# Patient Record
Sex: Female | Born: 1950
Health system: Southern US, Community
[De-identification: ages and names within clinical notes are randomized; demographics above are authoritative.]

## PROBLEM LIST (undated history)

## (undated) DIAGNOSIS — E785 Hyperlipidemia, unspecified: Secondary | ICD-10-CM

## (undated) DIAGNOSIS — I10 Essential (primary) hypertension: Secondary | ICD-10-CM

## (undated) DIAGNOSIS — G43909 Migraine, unspecified, not intractable, without status migrainosus: Secondary | ICD-10-CM

## (undated) DIAGNOSIS — D869 Sarcoidosis, unspecified: Secondary | ICD-10-CM

## (undated) DIAGNOSIS — F419 Anxiety disorder, unspecified: Secondary | ICD-10-CM

## (undated) DIAGNOSIS — F324 Major depressive disorder, single episode, in partial remission: Secondary | ICD-10-CM

## (undated) DIAGNOSIS — C801 Malignant (primary) neoplasm, unspecified: Secondary | ICD-10-CM

## (undated) HISTORY — DX: Essential (primary) hypertension: I10

## (undated) HISTORY — DX: Major depressive disorder, single episode, in partial remission: F32.4

## (undated) HISTORY — DX: Sarcoidosis, unspecified: D86.9

## (undated) HISTORY — DX: Hyperlipidemia, unspecified: E78.5

## (undated) HISTORY — DX: Anxiety disorder, unspecified: F41.9

## (undated) HISTORY — DX: Malignant (primary) neoplasm, unspecified: C80.1

---

## 2003-03-19 DIAGNOSIS — M1990 Unspecified osteoarthritis, unspecified site: Secondary | ICD-10-CM

## 2003-03-19 HISTORY — DX: Unspecified osteoarthritis, unspecified site: M19.90

## 2008-03-18 HISTORY — PX: CARDIAC CATHETERIZATION: SHX172

## 2016-10-23 LAB — HM MAMMOGRAPHY

## 2017-07-24 LAB — BASIC METABOLIC PANEL
BUN: 13 (ref 4–21)
Creatinine: 0.9 (ref 0.5–1.1)
GLUCOSE: 112
Potassium: 4.7 (ref 3.4–5.3)
Sodium: 140 (ref 137–147)

## 2017-07-24 LAB — LIPID PANEL
Cholesterol: 220 — AB (ref 0–200)
HDL: 84 — AB (ref 35–70)
LDL CALC: 124
Triglycerides: 58 (ref 40–160)

## 2017-07-24 LAB — HEPATIC FUNCTION PANEL
ALT: 32 (ref 7–35)
AST: 22 (ref 13–35)
Alkaline Phosphatase: 78 (ref 25–125)
Bilirubin, Total: 0.5

## 2017-11-30 ENCOUNTER — Encounter: Payer: Self-pay | Admitting: Internal Medicine

## 2017-12-01 ENCOUNTER — Ambulatory Visit (INDEPENDENT_AMBULATORY_CARE_PROVIDER_SITE_OTHER): Payer: Medicare PPO | Admitting: Internal Medicine

## 2017-12-01 ENCOUNTER — Encounter: Payer: Self-pay | Admitting: Internal Medicine

## 2017-12-01 VITALS — BP 112/68 | HR 58 | Ht 68.0 in | Wt 137.0 lb

## 2017-12-01 DIAGNOSIS — F324 Major depressive disorder, single episode, in partial remission: Secondary | ICD-10-CM | POA: Insufficient documentation

## 2017-12-01 DIAGNOSIS — M67471 Ganglion, right ankle and foot: Secondary | ICD-10-CM | POA: Diagnosis not present

## 2017-12-01 DIAGNOSIS — E782 Mixed hyperlipidemia: Secondary | ICD-10-CM | POA: Insufficient documentation

## 2017-12-01 DIAGNOSIS — I10 Essential (primary) hypertension: Secondary | ICD-10-CM | POA: Diagnosis not present

## 2017-12-01 DIAGNOSIS — M1612 Unilateral primary osteoarthritis, left hip: Secondary | ICD-10-CM | POA: Insufficient documentation

## 2017-12-01 DIAGNOSIS — M67479 Ganglion, unspecified ankle and foot: Secondary | ICD-10-CM | POA: Insufficient documentation

## 2017-12-01 MED ORDER — SIMVASTATIN 20 MG PO TABS: 20.0000 mg | ORAL_TABLET | Freq: Every day | ORAL | 1 refills | Status: DC

## 2017-12-01 MED ORDER — ATENOLOL 25 MG PO TABS: 25.0000 mg | ORAL_TABLET | Freq: Every day | ORAL | 1 refills | Status: DC

## 2017-12-01 NOTE — Progress Notes (Signed)
Date:  12/01/2017   Name:  Diana Mcdaniel   DOB:  [birth year]   MRN:  [id number]  Pt recently moved here from [facility]. She states that she is up to date on mammogram, colonoscopy, vaccinations and Pap smear.  Chief Complaint: Establish Care (Med refills. ); Hypertension; Hyperlipidemia; and Foot Pain (Painful foot right with lump. Started 3 years ago, Was told in the past its arthritis. )  Hypertension  This is a chronic problem. The problem is controlled. Pertinent negatives include no chest pain, headaches, palpitations or shortness of breath. Past treatments include beta blockers and angiotensin blockers. The current treatment provides significant improvement.  Hyperlipidemia  This is a chronic problem. The problem is controlled. Pertinent negatives include no chest pain or shortness of breath. Current antihyperlipidemic treatment includes statins. The current treatment provides significant improvement of lipids.  Foot Pain  This is a chronic problem. The problem occurs daily. The problem has been gradually worsening. Associated symptoms include arthralgias and joint swelling (dorsum of right foot). Pertinent negatives include no abdominal pain, chest pain, coughing, fatigue, fever, headaches or numbness. She has tried NSAIDs for the symptoms. The treatment provided moderate relief.  Depression         This is a chronic problem.  Episode onset: about three years.   The problem occurs intermittently.  Associated symptoms include no fatigue, no appetite change and no headaches.  Past treatments include SSRIs - Selective serotonin reuptake inhibitors.  Compliance with treatment is good. She has weaned off of clonazepam and had none for the past 2 weeks.  So far is doing well.  She would like to keep a few in case she has an anxiety attack.    Review of Systems  Constitutional: Negative for appetite change, fatigue, fever and unexpected weight change.  HENT: Negative for tinnitus and trouble  swallowing.   Eyes: Negative for visual disturbance.  Respiratory: Negative for cough, chest tightness and shortness of breath.   Cardiovascular: Negative for chest pain, palpitations and leg swelling.  Gastrointestinal: Negative for abdominal pain.  Endocrine: Negative for polydipsia and polyuria.  Genitourinary: Negative for dysuria and hematuria.  Musculoskeletal: Positive for arthralgias and joint swelling (dorsum of right foot). Negative for gait problem.  Allergic/Immunologic: Positive for environmental allergies.  Neurological: Negative for tremors, numbness and headaches.  Hematological: Negative for adenopathy.  Psychiatric/Behavioral: Positive for depression and sleep disturbance. Negative for dysphoric mood. The patient is nervous/anxious.     There are no active problems to display for this patient.   No Known Allergies  History reviewed. No pertinent surgical history.  Social History   Tobacco Use  . Smoking status: Never Smoker  . Smokeless tobacco: Never Used  Substance Use Topics  . Alcohol use: Yes    Alcohol/week: 10.0 standard drinks    Types: 10 Glasses of wine per week  . Drug use: Never     Medication list has been reviewed and updated.  Current Meds  Medication Sig  . atenolol (TENORMIN) 25 MG tablet Take by mouth daily.  Marland Kitchen FLUoxetine (PROZAC) 40 MG capsule Take 20 mg by mouth daily.   Marland Kitchen losartan (COZAAR) 25 MG tablet Take 25 mg by mouth daily.  . Multiple Vitamins-Minerals (CENTRUM SILVER 50+WOMEN PO) Take by mouth.  . naproxen (NAPROSYN) 500 MG tablet Take 500 mg by mouth daily.  . simvastatin (ZOCOR) 20 MG tablet Take 20 mg by mouth daily.  . Vitamins-Lipotropics (LIPO-FLAVONOID PLUS PO) Take by mouth.  PHQ 2/9 Scores 12/01/2017  PHQ - 2 Score 0    Physical Exam  Constitutional: She is oriented to person, place, and time. She appears well-developed. No distress.  HENT:  Head: Normocephalic and atraumatic.  Eyes: Pupils are equal,  round, and reactive to light.  Neck: Normal range of motion. Neck supple.  Cardiovascular: Normal rate, regular rhythm and normal heart sounds.  Pulmonary/Chest: Effort normal and breath sounds normal. No respiratory distress.  Abdominal: Soft. Bowel sounds are normal.  Musculoskeletal: Normal range of motion.  Neurological: She is alert and oriented to person, place, and time.  Skin: Skin is warm and dry. No rash noted.  Psychiatric: She has a normal mood and affect. Her behavior is normal. Thought content normal.  Nursing note and vitals reviewed.   BP 112/68 (BP Location: Right Arm, Patient Position: Sitting, Cuff Size: Normal)   Pulse (!) 58   Ht 5\' 8"  (1.727 m)   Wt 137 lb (62.1 kg)   SpO2 96%   BMI 20.83 kg/m   Assessment and Plan: 1. Essential hypertension Continue current medications - atenolol (TENORMIN) 25 MG tablet; Take 1 tablet (25 mg total) by mouth daily.  Dispense: 90 tablet; Refill: 1  2. Mixed hyperlipidemia On statin therapy - simvastatin (ZOCOR) 20 MG tablet; Take 1 tablet (20 mg total) by mouth daily.  Dispense: 90 tablet; Refill: 1  3. Primary osteoarthritis of left hip Continue naproxen as needed  4. Ganglion cyst of right foot Recommend beginning exercise and if pain worsens, will refer to Podiatry  5. Major depressive disorder with single episode, in partial remission (HCC) Continue Fluoxetine Hold clonazepam for now (pt has a few tablets left to use for severe anxiety) If needed, would allow #10/30 days PRN  HM - to sign release, return for CPX in 4 months.  Meds ordered this encounter  Medications  . atenolol (TENORMIN) 25 MG tablet    Sig: Take 1 tablet (25 mg total) by mouth daily.    Dispense:  90 tablet    Refill:  1  . simvastatin (ZOCOR) 20 MG tablet    Sig: Take 1 tablet (20 mg total) by mouth daily.    Dispense:  90 tablet    Refill:  1    Partially dictated using Editor, commissioning. Any errors are unintentional.  Halina Maidens,  MD [facility]  12/01/2017

## 2017-12-03 ENCOUNTER — Encounter: Payer: Self-pay | Admitting: Internal Medicine

## 2018-01-07 ENCOUNTER — Ambulatory Visit (INDEPENDENT_AMBULATORY_CARE_PROVIDER_SITE_OTHER): Payer: Medicare PPO

## 2018-01-07 DIAGNOSIS — Z23 Encounter for immunization: Secondary | ICD-10-CM

## 2018-04-28 ENCOUNTER — Other Ambulatory Visit: Payer: Self-pay | Admitting: Internal Medicine

## 2018-04-29 ENCOUNTER — Ambulatory Visit (INDEPENDENT_AMBULATORY_CARE_PROVIDER_SITE_OTHER): Payer: Medicare PPO | Admitting: Internal Medicine

## 2018-04-29 ENCOUNTER — Other Ambulatory Visit: Payer: Self-pay

## 2018-04-29 ENCOUNTER — Encounter: Payer: Self-pay | Admitting: Internal Medicine

## 2018-04-29 VITALS — BP 132/78 | HR 65 | Ht 68.0 in | Wt 199.0 lb

## 2018-04-29 DIAGNOSIS — Z1159 Encounter for screening for other viral diseases: Secondary | ICD-10-CM | POA: Diagnosis not present

## 2018-04-29 DIAGNOSIS — Z13820 Encounter for screening for osteoporosis: Secondary | ICD-10-CM | POA: Diagnosis not present

## 2018-04-29 DIAGNOSIS — G250 Essential tremor: Secondary | ICD-10-CM

## 2018-04-29 DIAGNOSIS — Z0000 Encounter for general adult medical examination without abnormal findings: Secondary | ICD-10-CM | POA: Diagnosis not present

## 2018-04-29 DIAGNOSIS — E782 Mixed hyperlipidemia: Secondary | ICD-10-CM | POA: Diagnosis not present

## 2018-04-29 DIAGNOSIS — I10 Essential (primary) hypertension: Secondary | ICD-10-CM | POA: Diagnosis not present

## 2018-04-29 DIAGNOSIS — F324 Major depressive disorder, single episode, in partial remission: Secondary | ICD-10-CM | POA: Diagnosis not present

## 2018-04-29 DIAGNOSIS — M67479 Ganglion, unspecified ankle and foot: Secondary | ICD-10-CM

## 2018-04-29 DIAGNOSIS — F410 Panic disorder [episodic paroxysmal anxiety] without agoraphobia: Secondary | ICD-10-CM

## 2018-04-29 DIAGNOSIS — Z1231 Encounter for screening mammogram for malignant neoplasm of breast: Secondary | ICD-10-CM | POA: Diagnosis not present

## 2018-04-29 DIAGNOSIS — Z1211 Encounter for screening for malignant neoplasm of colon: Secondary | ICD-10-CM

## 2018-04-29 DIAGNOSIS — D860 Sarcoidosis of lung: Secondary | ICD-10-CM | POA: Diagnosis not present

## 2018-04-29 DIAGNOSIS — G43109 Migraine with aura, not intractable, without status migrainosus: Secondary | ICD-10-CM | POA: Insufficient documentation

## 2018-04-29 MED ORDER — ATENOLOL 25 MG PO TABS: 25.0000 mg | ORAL_TABLET | Freq: Every day | ORAL | 1 refills | Status: DC

## 2018-04-29 MED ORDER — CLONAZEPAM 0.5 MG PO TABS: 0.5000 mg | ORAL_TABLET | Freq: Two times a day (BID) | ORAL | 1 refills | Status: DC | PRN

## 2018-04-29 MED ORDER — LOSARTAN POTASSIUM 25 MG PO TABS: 25.0000 mg | ORAL_TABLET | Freq: Every day | ORAL | 3 refills | Status: DC

## 2018-04-29 MED ORDER — SIMVASTATIN 20 MG PO TABS: 20.0000 mg | ORAL_TABLET | Freq: Every day | ORAL | 1 refills | Status: DC

## 2018-04-29 NOTE — Progress Notes (Signed)
Date:  04/29/2018   Name:  Diana Mcdaniel   DOB:  11/27/1950   MRN:  [id number]   Chief Complaint: Annual Exam (Breast Exam. ) and Anxiety (Stopped taking Prozac 03/13/2018 because she wanted to be off of it. She is now having panic attacks again. Stopped taking Klonipin 08/27th. Last two weeks she has had 4 attacks.  ) Diana Mcdaniel is a 68 y.o. female who presents today for her Complete Annual Exam. She feels fairly well. She reports exercising some - walking. She reports she is sleeping fairly well. She is due for mammogram.  She believes that she has had pneumonia vaccines. She is also due for colonoscopy - gets them every 5 years due to family hx.   Hypertension  This is a chronic problem. The problem is controlled. Associated symptoms include anxiety. Pertinent negatives include no chest pain, headaches, palpitations or shortness of breath. Past treatments include beta blockers and angiotensin blockers. The current treatment provides significant improvement.  Depression         Chronicity: panic attacks.  The problem has been resolved since onset.  Associated symptoms include irritable and restlessness (panic attacks).  Associated symptoms include no fatigue and no headaches.  Past treatments include SSRIs - Selective serotonin reuptake inhibitors (weaned of prozac last fall).  Past medical history includes anxiety.   Anxiety  Presents for follow-up visit. Symptoms include nervous/anxious behavior and restlessness (panic attacks). Patient reports no chest pain, dizziness, palpitations or shortness of breath. Symptoms occur occasionally. The severity of symptoms is causing significant distress (seems to be recurring since stopping prozac and clonazepam routinely). The quality of sleep is good.    Hip Pain   There was no injury mechanism. The pain is present in the left hip. The quality of the pain is described as aching. The pain is moderate. Associated symptoms include a loss of  motion. Pertinent negatives include no muscle weakness, numbness or tingling. She has tried acetaminophen for the symptoms. The treatment provided moderate relief.  Foot Injury   There was no injury mechanism. The pain is present in the right foot. The pain is moderate (worsening with walking - has 2 swellings that are enlarging). Associated symptoms include a loss of motion. Pertinent negatives include no muscle weakness, numbness or tingling.  Sarcoidosis - long standing diagnosis confirmed by biopsy.  She was treated with inhalers but never took steroids.  Her previous PCP was getting routine CXR which pt stated "looked pretty bad" but has been stable.  She stopped inhalers a long time ago due to minimal sx and little benefit.  Migraine - started in teen years with flashing lights in the left eye followed by headache.  They resolved for a while the recently has had the visual aura without headache.  The aura lasts only 15-20 minutes then resolves without intervention.  Tremor - She has has a fine tremor of her head for several years.  It is not worsening and she can stop it voluntarily.  She has no other tremor and it does not bother her.  No family hx of tremor, no balance issues and no falls.  Review of Systems  Constitutional: Negative for chills, fatigue and fever.  HENT: Negative for congestion, hearing loss, tinnitus, trouble swallowing and voice change.   Eyes: Negative for visual disturbance.  Respiratory: Negative for cough, chest tightness, shortness of breath and wheezing.   Cardiovascular: Negative for chest pain, palpitations and leg swelling.  Gastrointestinal: Negative for  abdominal pain, constipation, diarrhea and vomiting.  Endocrine: Negative for polydipsia and polyuria.  Genitourinary: Negative for dysuria, frequency, genital sores, vaginal bleeding and vaginal discharge.  Musculoskeletal: Positive for arthralgias (left hip pain, right foot pain). Negative for gait problem and  joint swelling.  Skin: Negative for color change and rash.  Neurological: Negative for dizziness, tingling, tremors, light-headedness, numbness and headaches.  Hematological: Negative for adenopathy. Does not bruise/bleed easily.  Psychiatric/Behavioral: Positive for depression and sleep disturbance. Negative for dysphoric mood. The patient is nervous/anxious.     Patient Active Problem List   Diagnosis Date Noted  . Essential hypertension 12/01/2017  . Mixed hyperlipidemia 12/01/2017  . Ganglion cyst of right foot 12/01/2017  . Major depressive disorder with single episode, in partial remission ([place]) 12/01/2017  . Primary osteoarthritis of left hip 12/01/2017    No Known Allergies  Past Surgical History:  Procedure Laterality Date  . CARDIAC CATHETERIZATION  2010   Normal    Social History   Tobacco Use  . Smoking status: Never Smoker  . Smokeless tobacco: Never Used  Substance Use Topics  . Alcohol use: Yes    Alcohol/week: 10.0 standard drinks    Types: 10 Glasses of wine per week  . Drug use: Never     Medication list has been reviewed and updated.  Current Meds  Medication Sig  . atenolol (TENORMIN) 25 MG tablet Take 1 tablet (25 mg total) by mouth daily.  Marland Kitchen losartan (COZAAR) 25 MG tablet Take 25 mg by mouth daily.  . Multiple Vitamins-Minerals (CENTRUM SILVER 50+WOMEN PO) Take by mouth.  . simvastatin (ZOCOR) 20 MG tablet Take 1 tablet (20 mg total) by mouth daily.  . Vitamins-Lipotropics (LIPO-FLAVONOID PLUS PO) Take by mouth.    PHQ 2/9 Scores 04/29/2018 12/01/2017  PHQ - 2 Score 2 0  PHQ- 9 Score 6 -    Physical Exam Vitals signs and nursing note reviewed.  Constitutional:      General: She is irritable. She is not in acute distress.    Appearance: She is well-developed.  HENT:     Head: Normocephalic and atraumatic.     Right Ear: Tympanic membrane and ear canal normal.     Left Ear: Tympanic membrane and ear canal normal.     Nose:     Right  Sinus: No maxillary sinus tenderness.     Left Sinus: No maxillary sinus tenderness.     Mouth/Throat:     Pharynx: Uvula midline.  Eyes:     General: No scleral icterus.       Right eye: No discharge.        Left eye: No discharge.     Conjunctiva/sclera: Conjunctivae normal.  Neck:     Musculoskeletal: Normal range of motion. No erythema.     Thyroid: No thyromegaly.     Vascular: No carotid bruit.  Cardiovascular:     Rate and Rhythm: Normal rate and regular rhythm.     Pulses: Normal pulses.     Heart sounds: Normal heart sounds.  Pulmonary:     Effort: Pulmonary effort is normal. No respiratory distress.     Breath sounds: No wheezing.  Chest:     Breasts:        Right: No mass, nipple discharge, skin change or tenderness.        Left: No mass, nipple discharge, skin change or tenderness.  Abdominal:     General: Bowel sounds are normal.     Palpations: Abdomen  is soft.     Tenderness: There is no abdominal tenderness.  Musculoskeletal:     Right hip: Normal.     Left hip: She exhibits decreased range of motion. She exhibits normal strength and no tenderness.       Feet:  Lymphadenopathy:     Cervical: No cervical adenopathy.  Skin:    General: Skin is warm and dry.     Findings: No rash.  Neurological:     Mental Status: She is alert and oriented to person, place, and time.     Cranial Nerves: No cranial nerve deficit.     Sensory: Sensation is intact. No sensory deficit.     Motor: Tremor (fine tremor of the head -) present.     Deep Tendon Reflexes: Reflexes are normal and symmetric.  Psychiatric:        Attention and Perception: Attention normal.        Mood and Affect: Mood normal.        Speech: Speech normal.        Behavior: Behavior normal.        Thought Content: Thought content normal.        Cognition and Memory: Cognition normal.        Judgment: Judgment normal.     BP 132/78   Pulse 65   Ht 5\' 8"  (1.727 m)   Wt 199 lb (90.3 kg)   SpO2 97%    BMI 30.26 kg/m   Assessment and Plan: 1. Annual physical exam Normal except for weight - work on diet and regular exercise  2. Encounter for screening mammogram for breast cancer Schedule at [place]; Future  3. Essential hypertension controlled - CBC with Differential/Platelet - Comprehensive metabolic panel - atenolol (TENORMIN) 25 MG tablet; Take 1 tablet (25 mg total) by mouth daily.  Dispense: 90 tablet; Refill: 1 - losartan (COZAAR) 25 MG tablet; Take 1 tablet (25 mg total) by mouth daily.  Dispense: 90 tablet; Refill: 3  4. Major depressive disorder with single episode, in partial remission ([place]) Depression resolved, some recurrent anxiety - TSH  5. Mixed hyperlipidemia On statin therapy - Lipid panel - simvastatin (ZOCOR) 20 MG tablet; Take 1 tablet (20 mg total) by mouth daily.  Dispense: 90 tablet; Refill: 1  6. Need for hepatitis C screening test - Hepatitis C antibody  7. Encounter for screening for osteoporosis Will schedule with mammogram - [facility] Bone Density; Future  8. Sarcoidosis of lung ([place]) Normal exam; minimal sx Consider repeat CXR but not of much value if pt is clinically stable  9. Migraine with aura and without status migrainosus, not intractable Pt reassured - follow up if sx worsen  10. Panic disorder Use clonazepam PRN only for panic attacks - clonazePAM (KLONOPIN) 0.5 MG tablet; Take 1 tablet (0.5 mg total) by mouth 2 (two) times daily as needed for anxiety.  Dispense: 20 tablet; Refill: 1  11. Colon cancer screening Refer for screening - Ambulatory referral to Gastroenterology  12. Ganglion cyst of foot Becoming more painful and interfering with exercise - Ambulatory referral to Podiatry  13. Tremor, essential Appears benign  If progressive, would recommend Neurology consultation   Partially dictated using Dragon software. Any errors are unintentional.  Halina Maidens, MD [facility]  04/29/2018

## 2018-04-29 NOTE — Patient Instructions (Addendum)
Ask about PPV-23 and Prevnar-13 - get dates of administration  Ask about DEXA - bone density test results  Colonoscopy report

## 2018-04-30 LAB — COMPREHENSIVE METABOLIC PANEL
ALBUMIN: 4.6 g/dL (ref 3.8–4.8)
ALT: 21 IU/L (ref 0–32)
AST: 25 IU/L (ref 0–40)
Albumin/Globulin Ratio: 1.5 (ref 1.2–2.2)
Alkaline Phosphatase: 80 IU/L (ref 39–117)
BUN/Creatinine Ratio: 18 (ref 12–28)
BUN: 14 mg/dL (ref 8–27)
Bilirubin Total: 0.5 mg/dL (ref 0.0–1.2)
CALCIUM: 9.7 mg/dL (ref 8.7–10.3)
CHLORIDE: 98 mmol/L (ref 96–106)
CO2: 21 mmol/L (ref 20–29)
Creatinine, Ser: 0.79 mg/dL (ref 0.57–1.00)
GFR calc Af Amer: 88 mL/min/{1.73_m2} (ref >59)
GFR, EST NON AFRICAN AMERICAN: 77 mL/min/{1.73_m2} (ref >59)
GLOBULIN, TOTAL: 3 g/dL (ref 1.5–4.5)
GLUCOSE: 106 mg/dL — AB (ref 65–99)
POTASSIUM: 4.6 mmol/L (ref 3.5–5.2)
Sodium: 139 mmol/L (ref 134–144)
Total Protein: 7.6 g/dL (ref 6.0–8.5)

## 2018-04-30 LAB — CBC WITH DIFFERENTIAL/PLATELET
BASOS ABS: 0 10*3/uL (ref 0.0–0.2)
Basos: 1 %
EOS (ABSOLUTE): 0.1 10*3/uL (ref 0.0–0.4)
EOS: 2 %
HEMATOCRIT: 40.1 % (ref 34.0–46.6)
HEMOGLOBIN: 13.4 g/dL (ref 11.1–15.9)
IMMATURE GRANULOCYTES: 0 %
Immature Grans (Abs): 0 10*3/uL (ref 0.0–0.1)
LYMPHS: 18 %
Lymphocytes Absolute: 0.7 10*3/uL (ref 0.7–3.1)
MCH: 29.6 pg (ref 26.6–33.0)
MCHC: 33.4 g/dL (ref 31.5–35.7)
MCV: 89 fL (ref 79–97)
MONOCYTES: 13 %
Monocytes Absolute: 0.5 10*3/uL (ref 0.1–0.9)
NEUTROS PCT: 66 %
Neutrophils Absolute: 2.6 10*3/uL (ref 1.4–7.0)
Platelets: 290 10*3/uL (ref 150–450)
RBC: 4.52 x10E6/uL (ref 3.77–5.28)
RDW: 12 % (ref 11.7–15.4)
WBC: 4 10*3/uL (ref 3.4–10.8)

## 2018-04-30 LAB — TSH: TSH: 3.18 u[IU]/mL (ref 0.450–4.500)

## 2018-04-30 LAB — LIPID PANEL
CHOL/HDL RATIO: 2.7 ratio (ref 0.0–4.4)
Cholesterol, Total: 216 mg/dL — ABNORMAL HIGH (ref 100–199)
HDL: 80 mg/dL (ref >39)
LDL Calculated: 119 mg/dL — ABNORMAL HIGH (ref 0–99)
TRIGLYCERIDES: 84 mg/dL (ref 0–149)
VLDL Cholesterol Cal: 17 mg/dL (ref 5–40)

## 2018-04-30 LAB — HEPATITIS C ANTIBODY: Hep C Virus Ab: <0.1 s/co ratio (ref 0.0–0.9)

## 2018-05-04 ENCOUNTER — Encounter: Payer: Self-pay | Admitting: *Deleted

## 2018-05-14 ENCOUNTER — Encounter: Payer: Self-pay | Admitting: Internal Medicine

## 2018-05-15 ENCOUNTER — Ambulatory Visit: Payer: Medicare PPO | Admitting: Internal Medicine

## 2018-05-15 ENCOUNTER — Other Ambulatory Visit: Payer: Self-pay

## 2018-05-15 ENCOUNTER — Encounter: Payer: Self-pay | Admitting: Internal Medicine

## 2018-05-15 VITALS — BP 122/78 | HR 61 | Ht 68.0 in | Wt 199.0 lb

## 2018-05-15 DIAGNOSIS — F419 Anxiety disorder, unspecified: Secondary | ICD-10-CM | POA: Diagnosis not present

## 2018-05-15 DIAGNOSIS — F324 Major depressive disorder, single episode, in partial remission: Secondary | ICD-10-CM | POA: Diagnosis not present

## 2018-05-15 DIAGNOSIS — M67479 Ganglion, unspecified ankle and foot: Secondary | ICD-10-CM | POA: Diagnosis not present

## 2018-05-15 MED ORDER — FLUOXETINE HCL 20 MG PO TABS: 20.0000 mg | ORAL_TABLET | Freq: Every day | ORAL | 3 refills | Status: DC

## 2018-05-15 NOTE — Patient Instructions (Signed)
1/2 clonazepam at bedtime and during the day as needed  Try to wean off the bedtime dose after 3-4 weeks

## 2018-05-15 NOTE — Progress Notes (Signed)
Date:  05/15/2018   Name:  Diana Mcdaniel   DOB:  Nov 24, 1950   MRN:  [id number]   Chief Complaint: Anxiety and Insomnia  Anxiety  Presents for follow-up visit. Symptoms include excessive worry, insomnia, nervous/anxious behavior and restlessness. Patient reports no chest pain, decreased concentration, dizziness, palpitations or suicidal ideas. Symptoms occur most days. Duration: started last month after husband was diagnosed wth prostate cancer. The severity of symptoms is causing significant distress. The quality of sleep is poor.    Depression         Associated symptoms include insomnia and restlessness.  Associated symptoms include no decreased concentration, no fatigue, no helplessness, no hopelessness, no appetite change, no headaches, not sad and no suicidal ideas.  Treatments tried: took prozac last year but was able to wean off.  Compliance with treatment is good.  Past medical history includes anxiety.    Foot cyst/pain - was seen by podiatry - tried to aspirate but unsuccessfully.  She was given a cortisone injection which has helped tremendously.   Review of Systems  Constitutional: Negative for appetite change, fatigue and fever.  Respiratory: Negative for chest tightness.   Cardiovascular: Negative for chest pain and palpitations.  Neurological: Negative for dizziness, light-headedness and headaches.  Psychiatric/Behavioral: Positive for depression and sleep disturbance. Negative for decreased concentration, hallucinations and suicidal ideas. The patient is nervous/anxious and has insomnia.     Patient Active Problem List   Diagnosis Date Noted  . Sarcoidosis of lung ([place]) 04/29/2018  . Migraine headache with aura 04/29/2018  . Tremor, essential 04/29/2018  . Panic disorder 04/29/2018  . Essential hypertension 12/01/2017  . Mixed hyperlipidemia 12/01/2017  . Ganglion cyst of foot 12/01/2017  . Major depressive disorder with single episode, in partial remission  ([place]) 12/01/2017  . Primary osteoarthritis of left hip 12/01/2017    No Known Allergies  Past Surgical History:  Procedure Laterality Date  . CARDIAC CATHETERIZATION  2010   Normal    Social History   Tobacco Use  . Smoking status: Never Smoker  . Smokeless tobacco: Never Used  Substance Use Topics  . Alcohol use: Yes    Alcohol/week: 10.0 standard drinks    Types: 10 Glasses of wine per week  . Drug use: Never     Medication list has been reviewed and updated.  Current Meds  Medication Sig  . atenolol (TENORMIN) 25 MG tablet Take 1 tablet (25 mg total) by mouth daily.  . clonazePAM (KLONOPIN) 0.5 MG tablet Take 1 tablet (0.5 mg total) by mouth 2 (two) times daily as needed for anxiety.  Marland Kitchen losartan (COZAAR) 25 MG tablet Take 1 tablet (25 mg total) by mouth daily.  . Multiple Vitamins-Minerals (CENTRUM SILVER 50+WOMEN PO) Take by mouth.  . simvastatin (ZOCOR) 20 MG tablet Take 1 tablet (20 mg total) by mouth daily.  . Vitamins-Lipotropics (LIPO-FLAVONOID PLUS PO) Take by mouth.    PHQ 2/9 Scores 05/15/2018 04/29/2018 12/01/2017  PHQ - 2 Score 2 2 0  PHQ- 9 Score 7 6 -   GAD 7 : Generalized Anxiety Score 05/15/2018 04/29/2018  Nervous, Anxious, on Edge 3 2  Control/stop worrying 0 2  Worry too much - different things 0 2  Trouble relaxing 3 2  Restless 3 2  Easily annoyed or irritable 0 0  Afraid - awful might happen 0 0  Total GAD 7 Score 9 10  Anxiety Difficulty Very difficult Very difficult      Physical Exam Vitals  signs and nursing note reviewed.  Constitutional:      General: She is not in acute distress.    Appearance: She is well-developed.  HENT:     Head: Normocephalic and atraumatic.  Pulmonary:     Effort: Pulmonary effort is normal. No respiratory distress.  Musculoskeletal: Normal range of motion.  Skin:    General: Skin is warm and dry.     Findings: No rash.  Neurological:     Mental Status: She is alert and oriented to person, place, and  time.  Psychiatric:        Attention and Perception: Attention normal.        Mood and Affect: Mood is anxious.        Speech: Speech normal.        Behavior: Behavior normal.        Thought Content: Thought content normal.        Cognition and Memory: Cognition normal.        Judgment: Judgment normal.     BP 122/78   Pulse 61   Ht 5\' 8"  (1.727 m)   Wt 199 lb (90.3 kg)   SpO2 96%   BMI 30.26 kg/m   Assessment and Plan: 1. Anxiety disorder, unspecified type Primary issues at this time  Continue clonazepam 1/2 tablet at HS and during the day PRN Return if sx fail to improve - FLUoxetine (PROZAC) 20 MG tablet; Take 1 tablet (20 mg total) by mouth daily.  Dispense: 90 tablet; Refill: 3  2. Ganglion cyst of foot Improved after cortisone injection by podiatry  3. Major depressive disorder with single episode, in partial remission ([place]) Mild depressive sx currently - Prozac will be helpful   Partially dictated using Dragon software. Any errors are unintentional.  Halina Maidens, MD [facility]  05/15/2018

## 2018-05-20 ENCOUNTER — Other Ambulatory Visit: Payer: Self-pay

## 2018-05-20 ENCOUNTER — Telehealth: Payer: Self-pay | Admitting: Gastroenterology

## 2018-05-20 DIAGNOSIS — Z800 Family history of malignant neoplasm of digestive organs: Secondary | ICD-10-CM

## 2018-05-20 NOTE — Telephone Encounter (Signed)
Gastroenterology Pre-Procedure Review  Request Date: 07/27/18 Requesting Physician: Dr. Allen Norris  PATIENT REVIEW QUESTIONS: The patient responded to the following health history questions as indicated:    1. Are you having any GI issues? no 2. Do you have a personal history of Polyps? no 3. Do you have a family history of Colon Cancer or Polyps? yes (Father Colon Cancer) 4. Diabetes Mellitus? no 5. Joint replacements in the past 12 months?no 6. Major health problems in the past 3 months?no 7. Any artificial heart valves, MVP, or defibrillator?no    MEDICATIONS & ALLERGIES:    Patient reports the following regarding taking any anticoagulation/antiplatelet therapy:   Plavix, Coumadin, Eliquis, Xarelto, Lovenox, Pradaxa, Brilinta, or Effient? no Aspirin? no  Patient confirms/reports the following medications:  Current Outpatient Medications  Medication Sig Dispense Refill  . atenolol (TENORMIN) 25 MG tablet Take 1 tablet (25 mg total) by mouth daily. 90 tablet 1  . clonazePAM (KLONOPIN) 0.5 MG tablet Take 1 tablet (0.5 mg total) by mouth 2 (two) times daily as needed for anxiety. 20 tablet 1  . FLUoxetine (PROZAC) 20 MG tablet Take 1 tablet (20 mg total) by mouth daily. 90 tablet 3  . losartan (COZAAR) 25 MG tablet Take 1 tablet (25 mg total) by mouth daily. 90 tablet 3  . Multiple Vitamins-Minerals (CENTRUM SILVER 50+WOMEN PO) Take by mouth.    . simvastatin (ZOCOR) 20 MG tablet Take 1 tablet (20 mg total) by mouth daily. 90 tablet 1  . Vitamins-Lipotropics (LIPO-FLAVONOID PLUS PO) Take by mouth.     No current facility-administered medications for this visit.     Patient confirms/reports the following allergies:  No Known Allergies  No orders of the defined types were placed in this encounter.   AUTHORIZATION INFORMATION Primary Insurance: 1D#: Group #:  Secondary Insurance: 1D#: Group #:  SCHEDULE INFORMATION: Date: 07/27/18 Time: Location:[facility]

## 2018-05-20 NOTE — Telephone Encounter (Signed)
Pt is calling for Diana Mcdaniel to schedule a colonoscopy °

## 2018-05-26 ENCOUNTER — Encounter: Payer: Self-pay | Admitting: Internal Medicine

## 2018-05-26 ENCOUNTER — Ambulatory Visit
Admission: RE | Admit: 2018-05-26 | Discharge: 2018-05-26 | Disposition: A | Discharge status: Home / Self Care | Payer: Medicare PPO | Source: Ambulatory Visit | Attending: Internal Medicine | Admitting: Internal Medicine

## 2018-05-26 DIAGNOSIS — Z1231 Encounter for screening mammogram for malignant neoplasm of breast: Secondary | ICD-10-CM | POA: Diagnosis present

## 2018-05-26 DIAGNOSIS — M85851 Other specified disorders of bone density and structure, right thigh: Secondary | ICD-10-CM | POA: Insufficient documentation

## 2018-05-26 DIAGNOSIS — M8580 Other specified disorders of bone density and structure, unspecified site: Secondary | ICD-10-CM | POA: Insufficient documentation

## 2018-05-26 DIAGNOSIS — Z13820 Encounter for screening for osteoporosis: Secondary | ICD-10-CM

## 2018-05-27 NOTE — Progress Notes (Signed)
Called patient back as she called me earlier. I advised her to view her mychart or call back again. I did detailed message and will try to call again if we do not hear back.

## 2018-06-10 ENCOUNTER — Other Ambulatory Visit: Payer: Self-pay

## 2018-07-17 ENCOUNTER — Other Ambulatory Visit: Payer: Self-pay | Admitting: Internal Medicine

## 2018-07-17 DIAGNOSIS — F410 Panic disorder [episodic paroxysmal anxiety] without agoraphobia: Secondary | ICD-10-CM

## 2018-07-20 ENCOUNTER — Telehealth: Payer: Self-pay | Admitting: Gastroenterology

## 2018-07-20 NOTE — Telephone Encounter (Signed)
When able to schedule,please call.

## 2018-07-20 NOTE — Telephone Encounter (Signed)
LVM for pt to call office to schedule colonoscopy.  Thanks [employer]

## 2018-07-21 ENCOUNTER — Other Ambulatory Visit: Payer: Self-pay

## 2018-07-21 ENCOUNTER — Telehealth: Payer: Self-pay | Admitting: Gastroenterology

## 2018-07-21 DIAGNOSIS — Z800 Family history of malignant neoplasm of digestive organs: Secondary | ICD-10-CM

## 2018-07-21 NOTE — Telephone Encounter (Signed)
Pt left vm she was scheduled for a colonoscopy and received a message to r/s please return her call

## 2018-07-21 NOTE — Telephone Encounter (Signed)
Gastroenterology Pre-Procedure Review  Request Date: 07/13 Requesting Physician: Dr. Allen Norris  PATIENT REVIEW QUESTIONS: The patient responded to the following health history questions as indicated:    1. Are you having any GI issues? No 2. Do you have a personal history of Polyps? No 3. Do you have a family history of Colon Cancer or Polyps? No 4. Diabetes Mellitus?No 5. Joint replacements in the past 12 months?No 6. Major health problems in the past 3 months?No 7. Any artificial heart valves, MVP, or defibrillator?No    MEDICATIONS & ALLERGIES:    Patient reports the following regarding taking any anticoagulation/antiplatelet therapy:   Plavix, Coumadin, Eliquis, Xarelto, Lovenox, Pradaxa, Brilinta, or Effient? No Aspirin? No  Patient confirms/reports the following medications:  Current Outpatient Medications  Medication Sig Dispense Refill  . atenolol (TENORMIN) 25 MG tablet Take 1 tablet (25 mg total) by mouth daily. 90 tablet 1  . clonazePAM (KLONOPIN) 0.5 MG tablet TAKE 1 TABLET (0.5 MG TOTAL) BY MOUTH 2 (TWO) TIMES DAILY AS NEEDED FOR ANXIETY. 20 tablet 5  . FLUoxetine (PROZAC) 20 MG tablet Take 1 tablet (20 mg total) by mouth daily. 90 tablet 3  . losartan (COZAAR) 25 MG tablet Take 1 tablet (25 mg total) by mouth daily. 90 tablet 3  . Multiple Vitamins-Minerals (CENTRUM SILVER 50+WOMEN PO) Take by mouth.    . simvastatin (ZOCOR) 20 MG tablet Take 1 tablet (20 mg total) by mouth daily. 90 tablet 1  . Vitamins-Lipotropics (LIPO-FLAVONOID PLUS PO) Take by mouth.     No current facility-administered medications for this visit.     Patient confirms/reports the following allergies:  No Known Allergies  No orders of the defined types were placed in this encounter.   AUTHORIZATION INFORMATION Primary Insurance: 1D#: Group #:  Secondary Insurance: 1D#: Group #:  SCHEDULE INFORMATION: Date: 09/28/18 Time: Location:[facility]

## 2018-07-23 ENCOUNTER — Encounter: Payer: Self-pay | Admitting: Internal Medicine

## 2018-07-23 ENCOUNTER — Other Ambulatory Visit: Payer: Self-pay

## 2018-07-23 ENCOUNTER — Ambulatory Visit: Payer: Medicare PPO | Admitting: Internal Medicine

## 2018-07-23 VITALS — BP 122/70 | HR 58 | Ht 68.0 in | Wt 200.0 lb

## 2018-07-23 DIAGNOSIS — I10 Essential (primary) hypertension: Secondary | ICD-10-CM

## 2018-07-23 DIAGNOSIS — R21 Rash and other nonspecific skin eruption: Secondary | ICD-10-CM | POA: Diagnosis not present

## 2018-07-23 DIAGNOSIS — F410 Panic disorder [episodic paroxysmal anxiety] without agoraphobia: Secondary | ICD-10-CM

## 2018-07-23 DIAGNOSIS — F324 Major depressive disorder, single episode, in partial remission: Secondary | ICD-10-CM | POA: Diagnosis not present

## 2018-07-23 NOTE — Progress Notes (Signed)
Date:  07/23/2018   Name:  Diana Mcdaniel   DOB:  12/28/1950   MRN:  [id number]   Chief Complaint: Anxiety  Anxiety  Presents for follow-up visit. Symptoms include insomnia and panic (panic sx are much improved - none in the past 6 weeks). Patient reports no chest pain, dizziness, nervous/anxious behavior, palpitations or shortness of breath. Symptoms occur rarely. The severity of symptoms is mild.   (Hx of pulmonary sarcoid) Compliance with medications is 76-100%.  Rash  This is a new problem. The current episode started 1 to 4 weeks ago. The problem is unchanged. The affected locations include the left lower leg. The rash is characterized by redness. She was exposed to nothing. Pertinent negatives include no cough, fatigue, fever or shortness of breath. Past treatments include nothing. (Hx of pulmonary sarcoid)  Hypertension  This is a chronic problem. The problem is controlled. Associated symptoms include anxiety. Pertinent negatives include no chest pain, headaches, palpitations or shortness of breath. Past treatments include angiotensin blockers. The current treatment provides significant improvement.    Review of Systems  Constitutional: Negative for chills, fatigue and fever.  HENT: Positive for nosebleeds.   Eyes: Negative for visual disturbance.  Respiratory: Negative for cough, chest tightness, shortness of breath and wheezing.   Cardiovascular: Negative for chest pain, palpitations and leg swelling.  Gastrointestinal: Negative for abdominal pain.  Skin: Positive for color change and rash.  Allergic/Immunologic: Negative for environmental allergies.  Neurological: Negative for dizziness and headaches.  Psychiatric/Behavioral: Negative for dysphoric mood, hallucinations and sleep disturbance. The patient has insomnia. The patient is not nervous/anxious.     Patient Active Problem List   Diagnosis Date Noted  . Osteopenia determined by x-ray 05/26/2018  . Sarcoidosis of  lung ([place]) 04/29/2018  . Migraine headache with aura 04/29/2018  . Tremor, essential 04/29/2018  . Panic disorder 04/29/2018  . Essential hypertension 12/01/2017  . Mixed hyperlipidemia 12/01/2017  . Ganglion cyst of foot 12/01/2017  . Major depressive disorder with single episode, in partial remission ([place]) 12/01/2017  . Primary osteoarthritis of left hip 12/01/2017    No Known Allergies  Past Surgical History:  Procedure Laterality Date  . CARDIAC CATHETERIZATION  2010   Normal    Social History   Tobacco Use  . Smoking status: Never Smoker  . Smokeless tobacco: Never Used  Substance Use Topics  . Alcohol use: Yes    Alcohol/week: 10.0 standard drinks    Types: 10 Glasses of wine per week  . Drug use: Never     Medication list has been reviewed and updated.  Current Meds  Medication Sig  . atenolol (TENORMIN) 25 MG tablet Take 1 tablet (25 mg total) by mouth daily.  . clonazePAM (KLONOPIN) 0.5 MG tablet TAKE 1 TABLET (0.5 MG TOTAL) BY MOUTH 2 (TWO) TIMES DAILY AS NEEDED FOR ANXIETY.  Marland Kitchen FLUoxetine (PROZAC) 20 MG tablet Take 1 tablet (20 mg total) by mouth daily. (Patient taking differently: Take 40 mg by mouth daily. )  . losartan (COZAAR) 25 MG tablet Take 1 tablet (25 mg total) by mouth daily.  . Multiple Vitamins-Minerals (CENTRUM SILVER 50+WOMEN PO) Take by mouth.  . simvastatin (ZOCOR) 20 MG tablet Take 1 tablet (20 mg total) by mouth daily.  . Vitamins-Lipotropics (LIPO-FLAVONOID PLUS PO) Take by mouth.    PHQ 2/9 Scores 07/23/2018 05/15/2018 04/29/2018 12/01/2017  PHQ - 2 Score 2 2 2  0  PHQ- 9 Score 2 7 6  -   GAD 7 :  Generalized Anxiety Score 07/23/2018 05/15/2018 04/29/2018  Nervous, Anxious, on Edge 0 3 2  Control/stop worrying 0 0 2  Worry too much - different things 0 0 2  Trouble relaxing 0 3 2  Restless 0 3 2  Easily annoyed or irritable 0 0 0  Afraid - awful might happen 0 0 0  Total GAD 7 Score 0 9 10  Anxiety Difficulty Not difficult at all Very  difficult Very difficult     BP Readings from Last 3 Encounters:  07/23/18 122/70  05/15/18 122/78  04/29/18 132/78    Physical Exam Vitals signs and nursing note reviewed.  Constitutional:      General: She is not in acute distress.    Appearance: Normal appearance. She is well-developed.  HENT:     Head: Normocephalic and atraumatic.  Neck:     Musculoskeletal: Normal range of motion and neck supple.     Vascular: No carotid bruit.  Cardiovascular:     Rate and Rhythm: Normal rate and regular rhythm.     Pulses: Normal pulses.  Pulmonary:     Effort: Pulmonary effort is normal. No respiratory distress.     Breath sounds: Normal breath sounds.  Musculoskeletal: Normal range of motion.     Right lower leg: No edema.     Left lower leg: No edema.  Lymphadenopathy:     Cervical: No cervical adenopathy.  Skin:    General: Skin is warm and dry.     Findings: No rash.       Neurological:     Mental Status: She is alert and oriented to person, place, and time.  Psychiatric:        Behavior: Behavior normal.        Thought Content: Thought content normal.     Wt Readings from Last 3 Encounters:  07/23/18 200 lb (90.7 kg)  05/15/18 199 lb (90.3 kg)  04/29/18 199 lb (90.3 kg)    BP 122/70   Pulse (!) 58   Ht 5\' 8"  (1.727 m)   Wt 200 lb (90.7 kg)   SpO2 98%   BMI 30.41 kg/m   Assessment and Plan: 1. Major depressive disorder with single episode, in partial remission ([place]) Doing well on Prozac; pt taking 40 mg per day  2. Panic disorder Much improved; continue clonazepam as needed (#20/mo)  3. Essential hypertension controlled  4. Rash and nonspecific skin eruption Asymptomatic - may need Dermatology evaluation if worsening or if labs are abnormal. - Sedimentation rate - ANA w/Reflex if Positive   Partially dictated using Editor, commissioning. Any errors are unintentional.  Halina Maidens, MD [facility]  07/23/2018

## 2018-07-24 LAB — ANA W/REFLEX IF POSITIVE: Anti Nuclear Antibody (ANA): NEGATIVE

## 2018-07-24 LAB — SEDIMENTATION RATE: Sed Rate: 18 mm/hr (ref 0–40)

## 2018-07-27 ENCOUNTER — Ambulatory Visit: Admit: 2018-07-27 | Payer: Self-pay | Admitting: Gastroenterology

## 2018-07-27 SURGERY — COLONOSCOPY WITH PROPOFOL
Anesthesia: Choice

## 2018-09-17 ENCOUNTER — Encounter: Payer: Self-pay | Admitting: *Deleted

## 2018-09-17 ENCOUNTER — Other Ambulatory Visit: Payer: Self-pay

## 2018-09-17 DIAGNOSIS — Z01812 Encounter for preprocedural laboratory examination: Secondary | ICD-10-CM

## 2018-09-24 ENCOUNTER — Other Ambulatory Visit
Admission: RE | Admit: 2018-09-24 | Discharge: 2018-09-24 | Disposition: A | Discharge status: Home / Self Care | Payer: Medicare PPO | Source: Ambulatory Visit | Attending: Gastroenterology | Admitting: Gastroenterology

## 2018-09-24 ENCOUNTER — Other Ambulatory Visit: Payer: Self-pay

## 2018-09-24 DIAGNOSIS — Z01812 Encounter for preprocedural laboratory examination: Secondary | ICD-10-CM | POA: Insufficient documentation

## 2018-09-24 DIAGNOSIS — Z1159 Encounter for screening for other viral diseases: Secondary | ICD-10-CM | POA: Insufficient documentation

## 2018-09-25 LAB — SARS CORONAVIRUS 2 (TAT 6-24 HRS): SARS Coronavirus 2: NEGATIVE

## 2018-09-25 NOTE — Discharge Instructions (Signed)

## 2018-09-28 ENCOUNTER — Other Ambulatory Visit: Payer: Self-pay

## 2018-09-28 ENCOUNTER — Ambulatory Visit
Admission: RE | Admit: 2018-09-28 | Discharge: 2018-09-28 | Disposition: A | Discharge status: Home / Self Care | Payer: Medicare PPO | Attending: Gastroenterology | Admitting: Gastroenterology

## 2018-09-28 ENCOUNTER — Encounter
Admission: RE | Disposition: A | Discharge status: Home / Self Care | Payer: Self-pay | Source: Home / Self Care | Attending: Gastroenterology

## 2018-09-28 ENCOUNTER — Ambulatory Visit: Payer: Medicare PPO | Admitting: Anesthesiology

## 2018-09-28 DIAGNOSIS — F419 Anxiety disorder, unspecified: Secondary | ICD-10-CM | POA: Insufficient documentation

## 2018-09-28 DIAGNOSIS — E785 Hyperlipidemia, unspecified: Secondary | ICD-10-CM | POA: Diagnosis not present

## 2018-09-28 DIAGNOSIS — Z8249 Family history of ischemic heart disease and other diseases of the circulatory system: Secondary | ICD-10-CM | POA: Diagnosis not present

## 2018-09-28 DIAGNOSIS — K5730 Diverticulosis of large intestine without perforation or abscess without bleeding: Secondary | ICD-10-CM | POA: Diagnosis not present

## 2018-09-28 DIAGNOSIS — Z800 Family history of malignant neoplasm of digestive organs: Secondary | ICD-10-CM | POA: Insufficient documentation

## 2018-09-28 DIAGNOSIS — I10 Essential (primary) hypertension: Secondary | ICD-10-CM | POA: Diagnosis not present

## 2018-09-28 DIAGNOSIS — D869 Sarcoidosis, unspecified: Secondary | ICD-10-CM | POA: Diagnosis not present

## 2018-09-28 DIAGNOSIS — Z79899 Other long term (current) drug therapy: Secondary | ICD-10-CM | POA: Diagnosis not present

## 2018-09-28 DIAGNOSIS — K635 Polyp of colon: Secondary | ICD-10-CM

## 2018-09-28 DIAGNOSIS — Z87891 Personal history of nicotine dependence: Secondary | ICD-10-CM | POA: Insufficient documentation

## 2018-09-28 DIAGNOSIS — Z1211 Encounter for screening for malignant neoplasm of colon: Secondary | ICD-10-CM | POA: Diagnosis not present

## 2018-09-28 DIAGNOSIS — D125 Benign neoplasm of sigmoid colon: Secondary | ICD-10-CM | POA: Diagnosis not present

## 2018-09-28 DIAGNOSIS — Z8371 Family history of colonic polyps: Secondary | ICD-10-CM | POA: Insufficient documentation

## 2018-09-28 DIAGNOSIS — K641 Second degree hemorrhoids: Secondary | ICD-10-CM | POA: Insufficient documentation

## 2018-09-28 HISTORY — DX: Migraine, unspecified, not intractable, without status migrainosus: G43.909

## 2018-09-28 HISTORY — PX: POLYPECTOMY: SHX5525

## 2018-09-28 HISTORY — PX: COLONOSCOPY WITH PROPOFOL: SHX5780

## 2018-09-28 SURGERY — COLONOSCOPY WITH PROPOFOL
Anesthesia: General | Site: Rectum

## 2018-09-28 MED ORDER — ACETAMINOPHEN 325 MG PO TABS: 325.0000 mg | ORAL_TABLET | Freq: Once | ORAL | Status: DC

## 2018-09-28 MED ORDER — ACETAMINOPHEN 160 MG/5ML PO SOLN: 325.0000 mg | Freq: Once | ORAL | Status: DC

## 2018-09-28 MED ORDER — LIDOCAINE HCL (CARDIAC) PF 100 MG/5ML IV SOSY
PREFILLED_SYRINGE | INTRAVENOUS | Status: DC | PRN
  Administered 2018-09-28: 30 mg via INTRAVENOUS

## 2018-09-28 MED ORDER — PROPOFOL 10 MG/ML IV BOLUS
INTRAVENOUS | Status: DC | PRN
  Administered 2018-09-28 (×2): 200 mg via INTRAVENOUS

## 2018-09-28 MED ORDER — SODIUM CHLORIDE 0.9 % IV SOLN: INTRAVENOUS | Status: DC

## 2018-09-28 MED ORDER — STERILE WATER FOR IRRIGATION IR SOLN
Status: DC | PRN
  Administered 2018-09-28: 08:00:00 15 mL

## 2018-09-28 MED ORDER — LACTATED RINGERS IV SOLN
INTRAVENOUS | Status: DC
  Administered 2018-09-28: 07:00:00 via INTRAVENOUS

## 2018-09-28 SURGICAL SUPPLY — 24 items
CANISTER SUCT 1200ML W/VALVE (MISCELLANEOUS) ×4 IMPLANT
CLIP HMST 235XBRD CATH ROT (MISCELLANEOUS) IMPLANT
CLIP RESOLUTION 360 11X235 (MISCELLANEOUS)
ELECT REM PT RETURN 9FT ADLT (ELECTROSURGICAL)
ELECTRODE REM PT RTRN 9FT ADLT (ELECTROSURGICAL) IMPLANT
FCP ESCP3.2XJMB 240X2.8X (MISCELLANEOUS)
FORCEPS BIOP RAD 4 LRG CAP 4 (CUTTING FORCEPS) IMPLANT
FORCEPS BIOP RJ4 240 W/NDL (MISCELLANEOUS)
FORCEPS ESCP3.2XJMB 240X2.8X (MISCELLANEOUS) IMPLANT
GOWN CVR UNV OPN BCK APRN NK (MISCELLANEOUS) ×4 IMPLANT
GOWN ISOL THUMB LOOP REG UNIV (MISCELLANEOUS) ×4
INJECTOR VARIJECT VIN23 (MISCELLANEOUS) IMPLANT
KIT DEFENDO VALVE AND CONN (KITS) IMPLANT
KIT ENDO PROCEDURE OLY (KITS) ×4 IMPLANT
MARKER SPOT ENDO TATTOO 5ML (MISCELLANEOUS) IMPLANT
PROBE APC STR FIRE (PROBE) IMPLANT
RETRIEVER NET ROTH 2.5X230 LF (MISCELLANEOUS) IMPLANT
SNARE SHORT THROW 13M SML OVAL (MISCELLANEOUS) ×4 IMPLANT
SNARE SHORT THROW 30M LRG OVAL (MISCELLANEOUS) IMPLANT
SNARE SNG USE RND 15MM (INSTRUMENTS) IMPLANT
SPOT EX ENDOSCOPIC TATTOO (MISCELLANEOUS)
TRAP ETRAP POLY (MISCELLANEOUS) ×4 IMPLANT
VARIJECT INJECTOR VIN23 (MISCELLANEOUS)
WATER STERILE IRR 250ML POUR (IV SOLUTION) ×4 IMPLANT

## 2018-09-28 NOTE — Anesthesia Postprocedure Evaluation (Addendum)
Anesthesia Post Note  Patient: Diana Mcdaniel  Procedure(s) Performed: COLONOSCOPY WITH  biopsy (N/A Rectum) POLYPECTOMY  Patient location during evaluation: PACU Anesthesia Type: General Level of consciousness: awake and alert and oriented Pain management: satisfactory to patient Vital Signs Assessment: post-procedure vital signs reviewed and stable Respiratory status: spontaneous breathing, nonlabored ventilation and respiratory function stable Cardiovascular status: blood pressure returned to baseline and stable Postop Assessment: Adequate PO intake and No signs of nausea or vomiting Anesthetic complications: no    Raliegh Ip

## 2018-09-28 NOTE — H&P (Signed)
Lucilla Lame, MD [facility] [address] [place], [place] [address] Phone:[phone] Fax : [phone]  Primary Care Physician:  Glean Hess, MD Primary Gastroenterologist:  Dr. Allen Norris  Pre-Procedure History & Physical: HPI:  Diana Mcdaniel is a 68 y.o. female is here for an colonoscopy.   Past Medical History:  Diagnosis Date  . Anxiety   . Hyperlipidemia   . Hypertension   . Migraine headache    1-2x/year  . Sarcoidosis     Past Surgical History:  Procedure Laterality Date  . CARDIAC CATHETERIZATION  2010   Normal    Prior to Admission medications   Medication Sig Start Date End Date Taking? Authorizing Provider  atenolol (TENORMIN) 25 MG tablet Take 1 tablet (25 mg total) by mouth daily. 04/29/18  Yes Glean Hess, MD  Calcium-Magnesium-Vitamin D (CALCIUM 1200+D3 PO) Take by mouth daily.   Yes [provider]  clonazePAM (KLONOPIN) 0.5 MG tablet TAKE 1 TABLET (0.5 MG TOTAL) BY MOUTH 2 (TWO) TIMES DAILY AS NEEDED FOR ANXIETY. 07/17/18  Yes Glean Hess, MD  FLUoxetine (PROZAC) 20 MG tablet Take 1 tablet (20 mg total) by mouth daily. Patient taking differently: Take 40 mg by mouth daily.  05/15/18  Yes Glean Hess, MD  losartan (COZAAR) 25 MG tablet Take 1 tablet (25 mg total) by mouth daily. 04/29/18  Yes Glean Hess, MD  Multiple Vitamins-Minerals (CENTRUM SILVER 50+WOMEN PO) Take by mouth.   Yes [provider]  simvastatin (ZOCOR) 20 MG tablet Take 1 tablet (20 mg total) by mouth daily. 04/29/18  Yes Glean Hess, MD  Vitamins-Lipotropics (LIPO-FLAVONOID PLUS PO) Take by mouth.   Yes [provider]    Allergies as of 07/21/2018  . (No Known Allergies)    Family History  Problem Relation Age of Onset  . Colon cancer Father   . Skin cancer Sister   . Breast cancer Sister 32  . Skin cancer Brother   . Breast cancer Maternal Grandmother 80  . Diabetes Neg Hx   . Heart disease Neg Hx   .  Hypertension Neg Hx     Social History   Socioeconomic History  . Marital status: Married    Spouse name: Not on file  . Number of children: 1  . Years of education: Not on file  . Highest education level: Not on file  Occupational History  . Not on file  Social Needs  . Financial resource strain: Not on file  . Food insecurity    Worry: Not on file    Inability: Not on file  . Transportation needs    Medical: Not on file    Non-medical: Not on file  Tobacco Use  . Smoking status: Former Smoker    Types: Cigarettes    Quit date: 1995    Years since quitting: 25.5  . Smokeless tobacco: Never Used  Substance and Sexual Activity  . Alcohol use: Yes    Alcohol/week: 24.0 standard drinks    Types: 24 Glasses of wine per week    Comment: 3-4 glasses wine/day  . Drug use: Never  . Sexual activity: Yes  Lifestyle  . Physical activity    Days per week: Not on file    Minutes per session: Not on file  . Stress: Not on file  Relationships  . Social Herbalist on phone: Not on file    Gets together: Not on file    Attends  religious service: Not on file    Active member of club or organization: Not on file    Attends meetings of clubs or organizations: Not on file    Relationship status: Not on file  . Intimate partner violence    Fear of current or ex partner: Not on file    Emotionally abused: Not on file    Physically abused: Not on file    Forced sexual activity: Not on file  Other Topics Concern  . Not on file  Social History Narrative  . Not on file    Review of Systems: See HPI, otherwise negative ROS  Physical Exam: BP 139/76   Pulse 70   Temp (!) 97.3 F (36.3 C) (Temporal)   Resp 18   Ht 5\' 8"  (1.727 m)   Wt 89.4 kg   SpO2 97%   BMI 29.95 kg/m  General:   Alert,  pleasant and cooperative in NAD Head:  Normocephalic and atraumatic. Neck:  Supple; no masses or thyromegaly. Lungs:  Clear throughout to auscultation.    Heart:  Regular  rate and rhythm. Abdomen:  Soft, nontender and nondistended. Normal bowel sounds, without guarding, and without rebound.   Neurologic:  Alert and  oriented x4;  grossly normal neurologically.  Impression/Plan: Diana Mcdaniel is here for an colonoscopy to be performed for family history of colon cancer.  Risks, benefits, limitations, and alternatives regarding  colonoscopy have been reviewed with the patient.  Questions have been answered.  All parties agreeable.   Lucilla Lame, MD  09/28/2018, 7:44 AM

## 2018-09-28 NOTE — Op Note (Signed)
[facility] Gastroenterology Patient Name: Diana Mcdaniel Procedure Date: 09/28/2018 7:25 AM MRN: [id number] Account #: [id number] Date of Birth: 08-08-1950 Admit Type: Outpatient Age: 68 Room: [facility] OR ROOM 01 Gender: Female Note Status: Finalized Procedure:            Colonoscopy Indications:          Family history of advanced adenoma of the colon in a                        first-degree relative before age 4 years Providers:            Lucilla Lame MD, MD Referring MD:         Halina Maidens, MD (Referring MD) Medicines:            Propofol per Anesthesia Complications:        No immediate complications. Procedure:            Pre-Anesthesia Assessment:                       - Prior to the procedure, a History and Physical was                        performed, and patient medications and allergies were                        reviewed. The patient's tolerance of previous                        anesthesia was also reviewed. The risks and benefits of                        the procedure and the sedation options and risks were                        discussed with the patient. All questions were                        answered, and informed consent was obtained. Prior                        Anticoagulants: The patient has taken no previous                        anticoagulant or antiplatelet agents. ASA Grade                        Assessment: II - A patient with mild systemic disease.                        After reviewing the risks and benefits, the patient was                        deemed in satisfactory condition to undergo the                        procedure.                       After obtaining informed consent, the colonoscope was  passed under direct vision. Throughout the procedure,                        the patient's blood pressure, pulse, and oxygen                        saturations were monitored continuously. The was                introduced through the anus and advanced to the the                        cecum, identified by appendiceal orifice and ileocecal                        valve. The colonoscopy was performed without                        difficulty. The patient tolerated the procedure well.                        The quality of the bowel preparation was excellent. Findings:      The perianal and digital rectal examinations were normal.      A 5 mm polyp was found in the sigmoid colon. The polyp was sessile. The       polyp was removed with a cold snare. Resection and retrieval were       complete.      Many small-mouthed diverticula were found in the entire colon.      Non-bleeding internal hemorrhoids were found during retroflexion. The       hemorrhoids were Grade II (internal hemorrhoids that prolapse but reduce       spontaneously). Impression:           - One 5 mm polyp in the sigmoid colon, removed with a                        cold snare. Resected and retrieved.                       - Diverticulosis in the entire examined colon.                       - Non-bleeding internal hemorrhoids. Recommendation:       - Discharge patient to home.                       - Resume previous diet.                       - Continue present medications.                       - Await pathology results.                       - Repeat colonoscopy in 5 years for surveillance. Procedure Code(s):    --- Professional ---                       [phone], Colonoscopy, flexible; with removal of tumor(s),  polyp(s), or other lesion(s) by snare technique Diagnosis Code(s):    --- Professional ---                       Z83.71, Family history of colonic polyps                       K63.5, Polyp of colon CPT copyright 2019 American Medical Association. All rights reserved. The codes documented in this report are preliminary and upon coder review may  be revised to meet current compliance  requirements. Lucilla Lame MD, MD 09/28/2018 8:10:47 AM This report has been signed electronically. Number of Addenda: 0 Note Initiated On: 09/28/2018 7:25 AM Scope Withdrawal Time: 0 hours 7 minutes 41 seconds  Total Procedure Duration: 0 hours 12 minutes 31 seconds  Estimated Blood Loss: Estimated blood loss: none.      [facility]

## 2018-09-28 NOTE — Anesthesia Preprocedure Evaluation (Signed)
Anesthesia Evaluation  Patient identified by MRN, date of birth, ID band Patient awake    Reviewed: Allergy & Precautions, H&P , NPO status , Patient's Chart, lab work & pertinent test results  Airway Mallampati: II  TM Distance: >3 FB Neck ROM: full    Dental no notable dental hx.    Pulmonary former smoker,  sarcoidosis   Pulmonary exam normal breath sounds clear to auscultation       Cardiovascular hypertension, Normal cardiovascular exam Rhythm:regular Rate:Normal     Neuro/Psych    GI/Hepatic   Endo/Other    Renal/GU      Musculoskeletal   Abdominal   Peds  Hematology   Anesthesia Other Findings   Reproductive/Obstetrics                             Anesthesia Physical Anesthesia Plan  ASA: II  Anesthesia Plan: General   Post-op Pain Management:    Induction: Intravenous  PONV Risk Score and Plan: 3 and Propofol infusion and Treatment may vary due to age or medical condition  Airway Management Planned: Natural Airway  Additional Equipment:   Intra-op Plan:   Post-operative Plan:   Informed Consent: I have reviewed the patients History and Physical, chart, labs and discussed the procedure including the risks, benefits and alternatives for the proposed anesthesia with the patient or authorized representative who has indicated his/her understanding and acceptance.       Plan Discussed with: CRNA  Anesthesia Plan Comments:         Anesthesia Quick Evaluation

## 2018-09-28 NOTE — Anesthesia Procedure Notes (Signed)
Procedure Name: General with mask airway Performed by: Izetta Dakin, CRNA Pre-anesthesia Checklist: Patient identified, Emergency Drugs available, Suction available, Patient being monitored and Timeout performed Oxygen Delivery Method: Nasal cannula

## 2018-09-28 NOTE — Transfer of Care (Signed)
Immediate Anesthesia Transfer of Care Note  Patient: Diana Mcdaniel  Procedure(s) Performed: COLONOSCOPY WITH  biopsy (N/A Rectum) POLYPECTOMY  Patient Location: PACU  Anesthesia Type: General  Level of Consciousness: awake, alert  and patient cooperative  Airway and Oxygen Therapy: Patient Spontanous Breathing and Patient connected to supplemental oxygen  Post-op Assessment: Post-op Vital signs reviewed, Patient's Cardiovascular Status Stable, Respiratory Function Stable, Patent Airway and No signs of Nausea or vomiting  Post-op Vital Signs: Reviewed and stable  Complications: No apparent anesthesia complications

## 2018-09-29 ENCOUNTER — Encounter: Payer: Self-pay | Admitting: Gastroenterology

## 2018-11-24 ENCOUNTER — Other Ambulatory Visit: Payer: Self-pay | Admitting: Internal Medicine

## 2018-11-24 DIAGNOSIS — E782 Mixed hyperlipidemia: Secondary | ICD-10-CM

## 2018-11-24 DIAGNOSIS — I10 Essential (primary) hypertension: Secondary | ICD-10-CM

## 2018-11-25 ENCOUNTER — Ambulatory Visit (INDEPENDENT_AMBULATORY_CARE_PROVIDER_SITE_OTHER): Payer: Medicare PPO

## 2018-11-25 ENCOUNTER — Other Ambulatory Visit: Payer: Self-pay

## 2018-11-25 DIAGNOSIS — Z23 Encounter for immunization: Secondary | ICD-10-CM | POA: Diagnosis not present

## 2018-11-26 ENCOUNTER — Ambulatory Visit: Payer: Medicare PPO

## 2019-01-27 DIAGNOSIS — L821 Other seborrheic keratosis: Secondary | ICD-10-CM | POA: Diagnosis not present

## 2019-01-27 DIAGNOSIS — L820 Inflamed seborrheic keratosis: Secondary | ICD-10-CM | POA: Diagnosis not present

## 2019-01-27 DIAGNOSIS — Z1283 Encounter for screening for malignant neoplasm of skin: Secondary | ICD-10-CM | POA: Diagnosis not present

## 2019-01-27 DIAGNOSIS — L570 Actinic keratosis: Secondary | ICD-10-CM | POA: Diagnosis not present

## 2019-01-27 DIAGNOSIS — D225 Melanocytic nevi of trunk: Secondary | ICD-10-CM | POA: Diagnosis not present

## 2019-01-27 DIAGNOSIS — D2372 Other benign neoplasm of skin of left lower limb, including hip: Secondary | ICD-10-CM | POA: Diagnosis not present

## 2019-01-27 DIAGNOSIS — L719 Rosacea, unspecified: Secondary | ICD-10-CM | POA: Diagnosis not present

## 2019-01-27 DIAGNOSIS — D485 Neoplasm of uncertain behavior of skin: Secondary | ICD-10-CM | POA: Diagnosis not present

## 2019-01-27 DIAGNOSIS — D229 Melanocytic nevi, unspecified: Secondary | ICD-10-CM | POA: Diagnosis not present

## 2019-01-27 DIAGNOSIS — L578 Other skin changes due to chronic exposure to nonionizing radiation: Secondary | ICD-10-CM | POA: Diagnosis not present

## 2019-01-27 DIAGNOSIS — D1800 Hemangioma unspecified site: Secondary | ICD-10-CM | POA: Diagnosis not present

## 2019-02-02 ENCOUNTER — Other Ambulatory Visit: Payer: Self-pay | Admitting: Internal Medicine

## 2019-02-02 DIAGNOSIS — F410 Panic disorder [episodic paroxysmal anxiety] without agoraphobia: Secondary | ICD-10-CM

## 2019-02-09 DIAGNOSIS — D1800 Hemangioma unspecified site: Secondary | ICD-10-CM | POA: Diagnosis not present

## 2019-02-09 DIAGNOSIS — L820 Inflamed seborrheic keratosis: Secondary | ICD-10-CM | POA: Diagnosis not present

## 2019-02-09 DIAGNOSIS — L821 Other seborrheic keratosis: Secondary | ICD-10-CM | POA: Diagnosis not present

## 2019-02-09 DIAGNOSIS — L570 Actinic keratosis: Secondary | ICD-10-CM | POA: Diagnosis not present

## 2019-04-06 ENCOUNTER — Other Ambulatory Visit: Payer: Self-pay | Admitting: Internal Medicine

## 2019-04-06 DIAGNOSIS — E782 Mixed hyperlipidemia: Secondary | ICD-10-CM

## 2019-04-06 DIAGNOSIS — F419 Anxiety disorder, unspecified: Secondary | ICD-10-CM

## 2019-04-06 DIAGNOSIS — I10 Essential (primary) hypertension: Secondary | ICD-10-CM

## 2019-04-07 DIAGNOSIS — L814 Other melanin hyperpigmentation: Secondary | ICD-10-CM | POA: Diagnosis not present

## 2019-04-07 DIAGNOSIS — L821 Other seborrheic keratosis: Secondary | ICD-10-CM | POA: Diagnosis not present

## 2019-04-07 DIAGNOSIS — Z872 Personal history of diseases of the skin and subcutaneous tissue: Secondary | ICD-10-CM | POA: Diagnosis not present

## 2019-04-07 DIAGNOSIS — L905 Scar conditions and fibrosis of skin: Secondary | ICD-10-CM | POA: Diagnosis not present

## 2019-04-07 DIAGNOSIS — L570 Actinic keratosis: Secondary | ICD-10-CM | POA: Diagnosis not present

## 2019-04-07 DIAGNOSIS — L578 Other skin changes due to chronic exposure to nonionizing radiation: Secondary | ICD-10-CM | POA: Diagnosis not present

## 2019-05-03 ENCOUNTER — Other Ambulatory Visit: Payer: Self-pay

## 2019-05-03 ENCOUNTER — Ambulatory Visit (INDEPENDENT_AMBULATORY_CARE_PROVIDER_SITE_OTHER): Payer: Medicare PPO | Admitting: Internal Medicine

## 2019-05-03 ENCOUNTER — Encounter: Payer: Self-pay | Admitting: Internal Medicine

## 2019-05-03 VITALS — BP 126/78 | HR 56 | Temp 96.0°F | Ht 68.0 in | Wt 205.0 lb

## 2019-05-03 DIAGNOSIS — F324 Major depressive disorder, single episode, in partial remission: Secondary | ICD-10-CM | POA: Diagnosis not present

## 2019-05-03 DIAGNOSIS — Z1231 Encounter for screening mammogram for malignant neoplasm of breast: Secondary | ICD-10-CM

## 2019-05-03 DIAGNOSIS — Z0000 Encounter for general adult medical examination without abnormal findings: Secondary | ICD-10-CM

## 2019-05-03 DIAGNOSIS — I10 Essential (primary) hypertension: Secondary | ICD-10-CM

## 2019-05-03 DIAGNOSIS — F5101 Primary insomnia: Secondary | ICD-10-CM

## 2019-05-03 DIAGNOSIS — E782 Mixed hyperlipidemia: Secondary | ICD-10-CM

## 2019-05-03 MED ORDER — LOSARTAN POTASSIUM 25 MG PO TABS: 25.0000 mg | ORAL_TABLET | Freq: Every day | ORAL | 3 refills | Status: DC

## 2019-05-03 NOTE — Progress Notes (Signed)
Date:  05/03/2019   Name:  Diana Mcdaniel   DOB:  Dec 24, 1950   MRN:  [id number]   Chief Complaint: Annual Exam (Breast Exam. No pap. ) Diana Mcdaniel is a 69 y.o. female who presents today for her Complete Annual Exam. She feels well. She reports exercising walking. She reports she is sleeping fairly well. She denies breast issues.  She is due for mammogram.  Mammogram  05/2018 DEXA  05/2018 Colonoscopy  09/2018 Immunization History  Administered Date(s) Administered  . Fluad Quad(high Dose 65+) 11/25/2018  . Influenza, High Dose Seasonal PF 01/07/2018  . Moderna SARS-COVID-2 Vaccination 04/30/2019    Hypertension This is a chronic problem. The problem is controlled. Pertinent negatives include no chest pain, headaches, palpitations or shortness of breath. Past treatments include beta blockers and angiotensin blockers. The current treatment provides significant improvement. There are no compliance problems.   Hyperlipidemia This is a chronic problem. The problem is controlled. Pertinent negatives include no chest pain or shortness of breath. Current antihyperlipidemic treatment includes statins. The current treatment provides significant improvement of lipids.  Depression        This is a chronic problem.The problem is unchanged.  Associated symptoms include no fatigue and no headaches.  Past treatments include SSRIs - Selective serotonin reuptake inhibitors.  Compliance with treatment is good.  Previous treatment provided significant relief.   Lab Results  Component Value Date   CREATININE 0.79 04/29/2018   BUN 14 04/29/2018   NA 139 04/29/2018   K 4.6 04/29/2018   CL 98 04/29/2018   CO2 21 04/29/2018   Lab Results  Component Value Date   CHOL 216 (H) 04/29/2018   HDL 80 04/29/2018   LDLCALC 119 (H) 04/29/2018   TRIG 84 04/29/2018   CHOLHDL 2.7 04/29/2018   Lab Results  Component Value Date   TSH 3.180 04/29/2018   No results found for: HGBA1C   Review of  Systems  Constitutional: Negative for chills, fatigue, fever and unexpected weight change.  HENT: Negative for congestion, hearing loss, trouble swallowing and voice change. Tinnitus: chronic.   Eyes: Negative for visual disturbance.  Respiratory: Negative for cough, chest tightness, shortness of breath and wheezing.   Cardiovascular: Negative for chest pain, palpitations and leg swelling.  Gastrointestinal: Negative for abdominal pain, constipation, diarrhea and vomiting.  Endocrine: Negative for polydipsia and polyuria.  Genitourinary: Negative for dysuria, frequency, genital sores, vaginal bleeding and vaginal discharge.  Musculoskeletal: Negative for arthralgias, gait problem and joint swelling.  Skin: Negative for color change and rash.  Neurological: Negative for dizziness, tremors, light-headedness and headaches.  Hematological: Negative for adenopathy. Does not bruise/bleed easily.  Psychiatric/Behavioral: Positive for depression. Negative for dysphoric mood and sleep disturbance. The patient is not nervous/anxious.     Patient Active Problem List   Diagnosis Date Noted  . Family history of colon cancer in father   . Family history of colonic polyps   . Polyp of sigmoid colon   . Rash and nonspecific skin eruption 07/23/2018  . Osteopenia determined by x-ray 05/26/2018  . Sarcoidosis of lung ([place]) 04/29/2018  . Migraine headache with aura 04/29/2018  . Tremor, essential 04/29/2018  . Panic disorder 04/29/2018  . Essential hypertension 12/01/2017  . Mixed hyperlipidemia 12/01/2017  . Ganglion cyst of foot 12/01/2017  . Major depressive disorder with single episode, in partial remission ([place]) 12/01/2017  . Primary osteoarthritis of left hip 12/01/2017    No Known Allergies  Past Surgical History:  Procedure Laterality Date  . CARDIAC CATHETERIZATION  2010   Normal  . COLONOSCOPY WITH PROPOFOL N/A 09/28/2018   Procedure: COLONOSCOPY WITH  biopsy;  Surgeon: Lucilla Lame,  MD;  Location: [place];  Service: Endoscopy;  Laterality: N/A;  . POLYPECTOMY  09/28/2018   Procedure: POLYPECTOMY;  Surgeon: Lucilla Lame, MD;  Location: [facility];  Service: Endoscopy;;    Social History   Tobacco Use  . Smoking status: Former Smoker    Types: Cigarettes    Quit date: 1995    Years since quitting: 26.1  . Smokeless tobacco: Never Used  Substance Use Topics  . Alcohol use: Yes    Alcohol/week: 24.0 standard drinks    Types: 24 Glasses of wine per week    Comment: 3-4 glasses wine/day  . Drug use: Never     Medication list has been reviewed and updated.  Current Meds  Medication Sig  . atenolol (TENORMIN) 25 MG tablet TAKE 1 TABLET BY MOUTH EVERY DAY  . Calcium-Magnesium-Vitamin D (CALCIUM 1200+D3 PO) Take by mouth daily.  . clonazePAM (KLONOPIN) 0.5 MG tablet TAKE 1 TABLET BY MOUTH 2 (TWO) TIMES DAILY AS NEEDED FOR ANXIETY.(30 DAYS ONLY PER MD)  . FLUoxetine (PROZAC) 40 MG capsule Take 1 capsule (40 mg total) by mouth daily.  Marland Kitchen losartan (COZAAR) 25 MG tablet Take 1 tablet (25 mg total) by mouth daily.  . Multiple Vitamins-Minerals (CENTRUM SILVER 50+WOMEN PO) Take by mouth.  . simvastatin (ZOCOR) 20 MG tablet TAKE 1 TABLET BY MOUTH EVERY DAY    PHQ 2/9 Scores 05/03/2019 07/23/2018 05/15/2018 04/29/2018  PHQ - 2 Score 0 2 2 2   PHQ- 9 Score 0 2 7 6     BP Readings from Last 3 Encounters:  05/03/19 126/78  09/28/18 103/66  07/23/18 122/70    Physical Exam Vitals and nursing note reviewed.  Constitutional:      General: She is not in acute distress.    Appearance: She is well-developed.  HENT:     Head: Normocephalic and atraumatic.     Right Ear: Tympanic membrane and ear canal normal.     Left Ear: Tympanic membrane and ear canal normal.     Nose:     Right Sinus: No maxillary sinus tenderness.     Left Sinus: No maxillary sinus tenderness.  Eyes:     General: No scleral icterus.       Right eye: No discharge.        Left  eye: No discharge.     Conjunctiva/sclera: Conjunctivae normal.  Neck:     Thyroid: No thyromegaly.     Vascular: No carotid bruit.  Cardiovascular:     Rate and Rhythm: Normal rate and regular rhythm.     Pulses: Normal pulses.     Heart sounds: Normal heart sounds.  Pulmonary:     Effort: Pulmonary effort is normal. No respiratory distress.     Breath sounds: No wheezing.  Chest:     Breasts:        Right: No mass, nipple discharge, skin change or tenderness.        Left: No mass, nipple discharge, skin change or tenderness.  Abdominal:     General: Bowel sounds are normal.     Palpations: Abdomen is soft.     Tenderness: There is no abdominal tenderness.  Musculoskeletal:     Cervical back: Normal range of motion. No erythema.     Right lower leg: No edema.  Left lower leg: No edema.  Lymphadenopathy:     Cervical: No cervical adenopathy.  Skin:    General: Skin is warm and dry.     Capillary Refill: Capillary refill takes less than 2 seconds.     Findings: No rash.  Neurological:     General: No focal deficit present.     Mental Status: She is alert and oriented to person, place, and time.     Cranial Nerves: No cranial nerve deficit.     Sensory: No sensory deficit.     Deep Tendon Reflexes: Reflexes are normal and symmetric.  Psychiatric:        Attention and Perception: Attention normal.        Mood and Affect: Mood normal.        Speech: Speech normal.        Behavior: Behavior normal.        Thought Content: Thought content normal.     Wt Readings from Last 3 Encounters:  05/03/19 205 lb (93 kg)  09/28/18 197 lb (89.4 kg)  07/23/18 200 lb (90.7 kg)    BP 126/78   Pulse (!) 56   Temp (!) 96 F (35.6 C) (Temporal)   Ht 5\' 8"  (1.727 m)   Wt 205 lb (93 kg)   SpO2 98%   BMI 31.17 kg/m   Assessment and Plan: 1. Annual physical exam Normal exam except for weight Continue healthy diet, more regular exercise  2. Encounter for screening mammogram  for breast cancer Schedule at [place]; Future  3. Essential hypertension Clinically stable exam with well controlled BP on losartan and atenolol. Tolerating medications without side effects at this time. Pt to continue current regimen and low sodium diet; benefits of regular exercise as able discussed. - losartan (COZAAR) 25 MG tablet; Take 1 tablet (25 mg total) by mouth daily.  Dispense: 90 tablet; Refill: 3 - CBC with Differential/Platelet - Comprehensive metabolic panel - POCT urinalysis dipstick  4. Mixed hyperlipidemia Tolerating statin medication without side effects at this time LDL is at goal of < 70 on current dose Continue same therapy without change at this time. - Lipid panel  5. Major depressive disorder with single episode, in partial remission ([place]) Doing well on Prozac daily; has tried in the past year to discontinue but symptoms recurred - TSH  6. Primary insomnia Uses low dose clonazepam nightly with good results   Partially dictated using Editor, commission[employer]. Any errors are unintentional.  Halina Maidens, MD [facility]  05/03/2019

## 2019-05-04 LAB — COMPREHENSIVE METABOLIC PANEL
ALT: 26 IU/L (ref 0–32)
AST: 30 IU/L (ref 0–40)
Albumin/Globulin Ratio: 1.8 (ref 1.2–2.2)
Albumin: 4.6 g/dL (ref 3.8–4.8)
Alkaline Phosphatase: 89 IU/L (ref 39–117)
BUN/Creatinine Ratio: 14 (ref 12–28)
BUN: 11 mg/dL (ref 8–27)
Bilirubin Total: 0.5 mg/dL (ref 0.0–1.2)
CO2: 27 mmol/L (ref 20–29)
Calcium: 9.8 mg/dL (ref 8.7–10.3)
Chloride: 97 mmol/L (ref 96–106)
Creatinine, Ser: 0.78 mg/dL (ref 0.57–1.00)
GFR calc Af Amer: 89 mL/min/{1.73_m2} (ref >59)
GFR calc non Af Amer: 77 mL/min/{1.73_m2} (ref >59)
Globulin, Total: 2.6 g/dL (ref 1.5–4.5)
Glucose: 102 mg/dL — ABNORMAL HIGH (ref 65–99)
Potassium: 5.4 mmol/L — ABNORMAL HIGH (ref 3.5–5.2)
Sodium: 135 mmol/L (ref 134–144)
Total Protein: 7.2 g/dL (ref 6.0–8.5)

## 2019-05-04 LAB — CBC WITH DIFFERENTIAL/PLATELET
Basophils Absolute: 0 10*3/uL (ref 0.0–0.2)
Basos: 1 %
EOS (ABSOLUTE): 0.1 10*3/uL (ref 0.0–0.4)
Eos: 3 %
Hematocrit: 37.2 % (ref 34.0–46.6)
Hemoglobin: 13 g/dL (ref 11.1–15.9)
Immature Grans (Abs): 0 10*3/uL (ref 0.0–0.1)
Immature Granulocytes: 1 %
Lymphocytes Absolute: 1 10*3/uL (ref 0.7–3.1)
Lymphs: 23 %
MCH: 31.2 pg (ref 26.6–33.0)
MCHC: 34.9 g/dL (ref 31.5–35.7)
MCV: 89 fL (ref 79–97)
Monocytes Absolute: 0.7 10*3/uL (ref 0.1–0.9)
Monocytes: 15 %
Neutrophils Absolute: 2.6 10*3/uL (ref 1.4–7.0)
Neutrophils: 57 %
Platelets: 286 10*3/uL (ref 150–450)
RBC: 4.17 x10E6/uL (ref 3.77–5.28)
RDW: 11.7 % (ref 11.7–15.4)
WBC: 4.4 10*3/uL (ref 3.4–10.8)

## 2019-05-04 LAB — LIPID PANEL
Chol/HDL Ratio: 2.8 ratio (ref 0.0–4.4)
Cholesterol, Total: 199 mg/dL (ref 100–199)
HDL: 72 mg/dL (ref >39)
LDL Chol Calc (NIH): 108 mg/dL — ABNORMAL HIGH (ref 0–99)
Triglycerides: 106 mg/dL (ref 0–149)
VLDL Cholesterol Cal: 19 mg/dL (ref 5–40)

## 2019-05-04 LAB — TSH: TSH: 4.95 u[IU]/mL — ABNORMAL HIGH (ref 0.450–4.500)

## 2019-05-12 LAB — SPECIMEN STATUS REPORT

## 2019-05-12 LAB — T4: T4, Total: 7.1 ug/dL (ref 4.5–12.0)

## 2019-06-07 ENCOUNTER — Other Ambulatory Visit: Payer: Self-pay | Admitting: Internal Medicine

## 2019-06-07 DIAGNOSIS — F419 Anxiety disorder, unspecified: Secondary | ICD-10-CM

## 2019-06-07 MED ORDER — FLUOXETINE HCL 20 MG PO TABS: 20.0000 mg | ORAL_TABLET | Freq: Every day | ORAL | 1 refills | Status: DC

## 2019-07-12 ENCOUNTER — Other Ambulatory Visit: Payer: Self-pay

## 2019-07-12 ENCOUNTER — Ambulatory Visit
Admission: RE | Admit: 2019-07-12 | Discharge: 2019-07-12 | Disposition: A | Discharge status: Home / Self Care | Payer: Medicare PPO | Source: Ambulatory Visit | Attending: Internal Medicine | Admitting: Internal Medicine

## 2019-07-12 DIAGNOSIS — Z1231 Encounter for screening mammogram for malignant neoplasm of breast: Secondary | ICD-10-CM | POA: Insufficient documentation

## 2019-07-29 ENCOUNTER — Other Ambulatory Visit: Payer: Self-pay | Admitting: Internal Medicine

## 2019-07-29 DIAGNOSIS — F410 Panic disorder [episodic paroxysmal anxiety] without agoraphobia: Secondary | ICD-10-CM

## 2019-07-29 NOTE — Telephone Encounter (Signed)
Pt called stating that she will be leaving out of town on Sunday and is requesting to have the medication sent in for her early. Please advise.

## 2019-09-30 ENCOUNTER — Other Ambulatory Visit: Payer: Self-pay | Admitting: Internal Medicine

## 2019-09-30 DIAGNOSIS — E782 Mixed hyperlipidemia: Secondary | ICD-10-CM

## 2019-09-30 DIAGNOSIS — F419 Anxiety disorder, unspecified: Secondary | ICD-10-CM

## 2019-09-30 DIAGNOSIS — I10 Essential (primary) hypertension: Secondary | ICD-10-CM

## 2019-09-30 NOTE — Telephone Encounter (Signed)
Requested Prescriptions  Pending Prescriptions Disp Refills  . FLUoxetine (PROZAC) 20 MG capsule [Pharmacy Med Name: FLUOXETINE HCL 20 MG CAPSULE] 90 capsule 1    Sig: TAKE 1 CAPSULE BY MOUTH EVERY DAY     Psychiatry:  Antidepressants - SSRI Passed - 09/30/2019  9:54 AM      Passed - Completed PHQ-2 or PHQ-9 in the last 360 days.      Passed - Valid encounter within last 6 months    Recent Outpatient Visits          5 months ago Annual physical exam   [facility] Glean Hess, MD   1 year ago Major depressive disorder with single episode, in partial remission [facility])   [facility] Glean Hess, MD   1 year ago Anxiety disorder, unspecified type   [facility] Glean Hess, MD   1 year ago Annual physical exam   [facility] Glean Hess, MD   1 year ago Essential hypertension   [facility] Glean Hess, MD      Future Appointments            In 1 month Army Melia Jesse Sans, MD [facility], [place]   In 7 months Army Melia Jesse Sans, MD [facility]           . atenolol (TENORMIN) 25 MG tablet [Pharmacy Med Name: ATENOLOL 25 MG TABLET] 90 tablet 1    Sig: TAKE 1 TABLET BY MOUTH EVERY DAY     Cardiovascular:  Beta Blockers Passed - 09/30/2019  9:54 AM      Passed - Last BP in normal range    BP Readings from Last 1 Encounters:  05/03/19 126/78         Passed - Last Heart Rate in normal range    Pulse Readings from Last 1 Encounters:  05/03/19 (!) 56         Passed - Valid encounter within last 6 months    Recent Outpatient Visits          5 months ago Annual physical exam   [facility] Glean Hess, MD   1 year ago Major depressive disorder with single episode, in partial remission [facility])   [facility] Glean Hess, MD   1 year ago Anxiety disorder, unspecified type   [facility] Glean Hess, MD   1 year ago Annual physical exam    [facility] Glean Hess, MD   1 year ago Essential hypertension   [facility] Glean Hess, MD      Future Appointments            In 1 month Army Melia Jesse Sans, MD [facility], [place]   In 7 months Army Melia Jesse Sans, MD [facility], [place]           . simvastatin (ZOCOR) 20 MG tablet [Pharmacy Med Name: SIMVASTATIN 20 MG TABLET] 90 tablet 2    Sig: TAKE 1 TABLET BY MOUTH EVERY DAY     Cardiovascular:  Antilipid - Statins Failed - 09/30/2019  9:54 AM      Failed - LDL in normal range and within 360 days    LDL Chol Calc (NIH)  Date Value Ref Range Status  05/03/2019 108 (H) 0 - 99 mg/dL Final         Passed - Total Cholesterol in normal range and  within 360 days    Cholesterol, Total  Date Value Ref Range Status  05/03/2019 199 100 - 199 mg/dL Final         Passed - HDL in normal range and within 360 days    HDL  Date Value Ref Range Status  05/03/2019 72 >39 mg/dL Final         Passed - Triglycerides in normal range and within 360 days    Triglycerides  Date Value Ref Range Status  05/03/2019 106 0 - 149 mg/dL Final         Passed - Patient is not pregnant      Passed - Valid encounter within last 12 months    Recent Outpatient Visits          5 months ago Annual physical exam   [facility] Glean Hess, MD   1 year ago Major depressive disorder with single episode, in partial remission [facility])   [facility] Glean Hess, MD   1 year ago Anxiety disorder, unspecified type   [facility] Glean Hess, MD   1 year ago Annual physical exam   [facility] Glean Hess, MD   1 year ago Essential hypertension   [facility] Glean Hess, MD      Future Appointments            In 1 month Army Melia Jesse Sans, MD [facility], [place]   In 7 months Army Melia, Jesse Sans, MD [facility], [facility]

## 2019-11-02 ENCOUNTER — Other Ambulatory Visit: Payer: Self-pay

## 2019-11-02 ENCOUNTER — Encounter: Payer: Self-pay | Admitting: Internal Medicine

## 2019-11-02 ENCOUNTER — Ambulatory Visit (INDEPENDENT_AMBULATORY_CARE_PROVIDER_SITE_OTHER): Payer: Medicare PPO | Admitting: Internal Medicine

## 2019-11-02 VITALS — BP 118/72 | HR 59 | Temp 98.0°F | Ht 68.0 in | Wt 205.0 lb

## 2019-11-02 DIAGNOSIS — F410 Panic disorder [episodic paroxysmal anxiety] without agoraphobia: Secondary | ICD-10-CM | POA: Diagnosis not present

## 2019-11-02 DIAGNOSIS — D860 Sarcoidosis of lung: Secondary | ICD-10-CM | POA: Diagnosis not present

## 2019-11-02 DIAGNOSIS — R7989 Other specified abnormal findings of blood chemistry: Secondary | ICD-10-CM | POA: Insufficient documentation

## 2019-11-02 DIAGNOSIS — M67479 Ganglion, unspecified ankle and foot: Secondary | ICD-10-CM | POA: Diagnosis not present

## 2019-11-02 DIAGNOSIS — F324 Major depressive disorder, single episode, in partial remission: Secondary | ICD-10-CM

## 2019-11-02 DIAGNOSIS — I10 Essential (primary) hypertension: Secondary | ICD-10-CM | POA: Diagnosis not present

## 2019-11-02 HISTORY — DX: Other specified abnormal findings of blood chemistry: R79.89

## 2019-11-02 MED ORDER — CLONAZEPAM 0.5 MG PO TABS: 0.5000 mg | ORAL_TABLET | Freq: Two times a day (BID) | ORAL | 5 refills | Status: DC | PRN

## 2019-11-02 NOTE — Progress Notes (Signed)
Date:  11/02/2019   Name:  Diana Mcdaniel   DOB:  03-11-1951   MRN:  [id number]   Chief Complaint: Hypertension (follow up ) and Depression (phQ-2 GAD-4 )  Hypertension This is a chronic problem. The problem is controlled. Pertinent negatives include no chest pain, headaches, palpitations or shortness of breath. Past treatments include beta blockers and angiotensin blockers. The current treatment provides significant improvement. There are no compliance problems.  There is no history of kidney disease or CAD/MI.  Depression        This is a chronic problem.The problem is unchanged.  Associated symptoms include no fatigue, no appetite change and no headaches.  Past treatments include SSRIs - Selective serotonin reuptake inhibitors.  Compliance with treatment is good.  Previous treatment provided significant relief. Foot pain - has ganglion cyst on dorsum of right foot.  Still causing some issues with walking.  No numbness or weakness.  Has seen Dr. Vickki Muff and attempted aspiration.  Immunization History  Administered Date(s) Administered  . Fluad Quad(high Dose 65+) 11/25/2018  . Influenza, High Dose Seasonal PF 01/07/2018  . Moderna SARS-COVID-2 Vaccination 04/30/2019, 05/29/2019    Lab Results  Component Value Date   CREATININE 0.78 05/03/2019   BUN 11 05/03/2019   NA 135 05/03/2019   K 5.4 (H) 05/03/2019   CL 97 05/03/2019   CO2 27 05/03/2019   Lab Results  Component Value Date   CHOL 199 05/03/2019   HDL 72 05/03/2019   LDLCALC 108 (H) 05/03/2019   TRIG 106 05/03/2019   CHOLHDL 2.8 05/03/2019   Lab Results  Component Value Date   TSH 4.950 (H) 05/03/2019   Lab Results  Component Value Date   TSH 4.950 (H) 05/03/2019   T4TOTAL 7.1 05/03/2019   No results found for: HGBA1C Lab Results  Component Value Date   WBC 4.4 05/03/2019   HGB 13.0 05/03/2019   HCT 37.2 05/03/2019   MCV 89 05/03/2019   PLT 286 05/03/2019   Lab Results  Component Value Date   ALT 26  05/03/2019   AST 30 05/03/2019   ALKPHOS 89 05/03/2019   BILITOT 0.5 05/03/2019     Review of Systems  Constitutional: Negative for appetite change, fatigue, fever and unexpected weight change.  HENT: Negative for tinnitus and trouble swallowing.   Eyes: Negative for visual disturbance.  Respiratory: Negative for cough, chest tightness and shortness of breath.   Cardiovascular: Negative for chest pain, palpitations and leg swelling.  Gastrointestinal: Negative for abdominal pain.  Genitourinary: Negative for dysuria and hematuria.  Musculoskeletal: Positive for arthralgias (right foot pain from ganglion cyst).  Neurological: Negative for tremors, numbness and headaches.  Psychiatric/Behavioral: Positive for depression. Negative for dysphoric mood and sleep disturbance. The patient is not nervous/anxious.     Patient Active Problem List   Diagnosis Date Noted  . Elevated TSH 11/02/2019  . Primary insomnia 05/03/2019  . Family history of colon cancer in father   . Family history of colonic polyps   . Polyp of sigmoid colon   . Osteopenia determined by x-ray 05/26/2018  . Sarcoidosis of lung ([place]) 04/29/2018  . Migraine headache with aura 04/29/2018  . Tremor, essential 04/29/2018  . Panic disorder 04/29/2018  . Essential hypertension 12/01/2017  . Mixed hyperlipidemia 12/01/2017  . Ganglion cyst of foot 12/01/2017  . Major depressive disorder with single episode, in partial remission ([place]) 12/01/2017  . Primary osteoarthritis of left hip 12/01/2017    No Known Allergies  Past Surgical History:  Procedure Laterality Date  . CARDIAC CATHETERIZATION  2010   Normal  . COLONOSCOPY WITH PROPOFOL N/A 09/28/2018   Procedure: COLONOSCOPY WITH  biopsy;  Surgeon: Lucilla Lame, MD;  Location: [place];  Service: Endoscopy;  Laterality: N/A;  . POLYPECTOMY  09/28/2018   Procedure: POLYPECTOMY;  Surgeon: Lucilla Lame, MD;  [facility];  Service: Endoscopy;;      Social History   Tobacco Use  . Smoking status: Former Smoker    Types: Cigarettes    Quit date: 1995    Years since quitting: 26.6  . Smokeless tobacco: Never Used  Vaping Use  . Vaping Use: Never used  Substance Use Topics  . Alcohol use: Yes    Alcohol/week: 24.0 standard drinks    Types: 24 Glasses of wine per week    Comment: 3-4 glasses wine/day  . Drug use: Never     Medication list has been reviewed and updated.  Current Meds  Medication Sig  . atenolol (TENORMIN) 25 MG tablet TAKE 1 TABLET BY MOUTH EVERY DAY  . Calcium-Magnesium-Vitamin D (CALCIUM 1200+D3 PO) Take by mouth daily.  . clonazePAM (KLONOPIN) 0.5 MG tablet TAKE 1 TABLET BY MOUTH 2 (TWO) TIMES DAILY AS NEEDED FOR ANXIETY.(30 DAYS ONLY PER MD)  . FLUoxetine (PROZAC) 20 MG capsule TAKE 1 CAPSULE BY MOUTH EVERY DAY  . losartan (COZAAR) 25 MG tablet Take 1 tablet (25 mg total) by mouth daily.  . Multiple Vitamins-Minerals (CENTRUM SILVER 50+WOMEN PO) Take by mouth.  . simvastatin (ZOCOR) 20 MG tablet TAKE 1 TABLET BY MOUTH EVERY DAY    PHQ 2/9 Scores 11/02/2019 05/03/2019 07/23/2018 05/15/2018  PHQ - 2 Score 2 0 2 2  PHQ- 9 Score 2 0 2 7    GAD 7 : Generalized Anxiety Score 11/02/2019 07/23/2018 05/15/2018 04/29/2018  Nervous, Anxious, on Edge 1 0 3 2  Control/stop worrying 1 0 0 2  Worry too much - different things 1 0 0 2  Trouble relaxing 0 0 3 2  Restless 0 0 3 2  Easily annoyed or irritable 0 0 0 0  Afraid - awful might happen 1 0 0 0  Total GAD 7 Score 4 0 9 10  Anxiety Difficulty Not difficult at all Not difficult at all Very difficult Very difficult    BP Readings from Last 3 Encounters:  11/02/19 118/72  05/03/19 126/78  09/28/18 103/66    Physical Exam Vitals and nursing note reviewed.  Constitutional:      General: She is not in acute distress.    Appearance: She is well-developed.  HENT:     Head: Normocephalic and atraumatic.  Cardiovascular:     Rate and Rhythm: Normal rate and  regular rhythm.     Pulses: Normal pulses.  Pulmonary:     Effort: Pulmonary effort is normal. No respiratory distress.     Breath sounds: No wheezing or rhonchi.  Musculoskeletal:     Cervical back: Normal range of motion.     Right lower leg: No edema.     Left lower leg: No edema.       Feet:  Feet:     Comments: Large ganglion cyst on dorsum of foot Lymphadenopathy:     Cervical: No cervical adenopathy.  Skin:    General: Skin is warm and dry.     Capillary Refill: Capillary refill takes less than 2 seconds.     Findings: No rash.  Neurological:  Mental Status: She is alert and oriented to person, place, and time.  Psychiatric:        Attention and Perception: Attention normal.        Mood and Affect: Mood normal.        Behavior: Behavior normal.     Wt Readings from Last 3 Encounters:  11/02/19 205 lb (93 kg)  05/03/19 205 lb (93 kg)  09/28/18 197 lb (89.4 kg)    BP 118/72   Pulse (!) 59   Temp 98 F (36.7 C) (Oral)   Ht 5\' 8"  (1.727 m)   Wt 205 lb (93 kg)   SpO2 96%   BMI 31.17 kg/m   Assessment and Plan: 1. Essential hypertension Clinically stable exam with well controlled BP on atenolol and losartan Tolerating medications without side effects at this time. Pt to continue current regimen and low sodium diet; benefits of regular exercise as able discussed.  2. Major depressive disorder with single episode, in partial remission ([facility]) Clinically stable on current regimen with good control of symptoms, No SI or HI. Will continue current therapy.  3. Sarcoidosis of lung ([place]) Stable without symptoms other than mild chronic DOE; no wheezing or chest pain  4. Panic disorder - clonazePAM (KLONOPIN) 0.5 MG tablet; Take 1 tablet (0.5 mg total) by mouth 2 (two) times daily as needed for anxiety.  Dispense: 20 tablet; Refill: 5  5. Ganglion cyst of foot Recommend follow up with podiatry   Partially dictated using [place]. Any errors are  unintentional.  Halina Maidens, MD [facility]  11/02/2019

## 2019-11-02 NOTE — Patient Instructions (Signed)
Diana Mcdaniel, [address] [place], [place] [address]  [phone]  [phone] (Fax)

## 2019-11-08 DIAGNOSIS — M79671 Pain in right foot: Secondary | ICD-10-CM | POA: Diagnosis not present

## 2019-11-08 DIAGNOSIS — M19071 Primary osteoarthritis, right ankle and foot: Secondary | ICD-10-CM | POA: Diagnosis not present

## 2019-12-03 ENCOUNTER — Other Ambulatory Visit: Payer: Self-pay

## 2019-12-03 ENCOUNTER — Ambulatory Visit (INDEPENDENT_AMBULATORY_CARE_PROVIDER_SITE_OTHER): Payer: Medicare PPO

## 2019-12-03 DIAGNOSIS — Z23 Encounter for immunization: Secondary | ICD-10-CM

## 2019-12-28 DIAGNOSIS — H21233 Degeneration of iris (pigmentary), bilateral: Secondary | ICD-10-CM | POA: Diagnosis not present

## 2019-12-28 DIAGNOSIS — H40013 Open angle with borderline findings, low risk, bilateral: Secondary | ICD-10-CM | POA: Diagnosis not present

## 2019-12-29 ENCOUNTER — Telehealth: Payer: Self-pay | Admitting: Internal Medicine

## 2019-12-29 NOTE — Telephone Encounter (Signed)
Left message for patient to call back and schedule Medicare Annual Wellness Visit (AWV) either virtually/audio only or in office. Whichever the patients preference is.  No history of AWV; please schedule at anytime with [facility] Health Advisor.  This should be a 40 minute visit  AWV-I AS OF 09/16/15 PER [place]

## 2020-01-03 ENCOUNTER — Ambulatory Visit (INDEPENDENT_AMBULATORY_CARE_PROVIDER_SITE_OTHER): Payer: Medicare PPO

## 2020-01-03 DIAGNOSIS — Z0000 Encounter for general adult medical examination without abnormal findings: Secondary | ICD-10-CM

## 2020-01-03 NOTE — Progress Notes (Signed)
Subjective:   Diana Mcdaniel is a 69 y.o. female who presents for an Initial Medicare Annual Wellness Visit.   Virtual Visit via Telephone Note  I connected with  Diana Mcdaniel on 01/03/20 at 11:20 AM EDT by telephone and verified that I am speaking with the correct person using two identifiers.  Medicare Annual Wellness visit completed telephonically due to Covid-19 pandemic.   Location: Patient: home Provider: [facility]   I discussed the limitations, risks, security and privacy concerns of performing an evaluation and management service by telephone and the availability of in person appointments. The patient expressed understanding and agreed to proceed.  Unable to perform video visit due to video visit attempted and failed and/or patient does not have video capability.   Some vital signs may be absent or patient reported.   Clemetine Marker, LPN    Review of Systems     Cardiac Risk Factors include: advanced age (>58men, >46 women);dyslipidemia;hypertension     Objective:    There were no vitals filed for this visit. There is no height or weight on file to calculate BMI.  Advanced Directives 01/03/2020 09/28/2018  Does Patient Have a Medical Advance Directive? Yes Yes  Type of Paramedic of [place];Living will -  Does patient want to make changes to medical advance directive? - No - Patient declined  Copy of [place] in Chart? No - copy requested -    Current Medications (verified) Outpatient Encounter Medications as of 01/03/2020  Medication Sig  . atenolol (TENORMIN) 25 MG tablet TAKE 1 TABLET BY MOUTH EVERY DAY  . Calcium-Magnesium-Vitamin D (CALCIUM 1200+D3 PO) Take by mouth daily.  . clonazePAM (KLONOPIN) 0.5 MG tablet Take 1 tablet (0.5 mg total) by mouth 2 (two) times daily as needed for anxiety.  Marland Kitchen FLUoxetine (PROZAC) 20 MG capsule TAKE 1 CAPSULE BY MOUTH EVERY DAY  . losartan (COZAAR) 25 MG tablet Take 1 tablet (25  mg total) by mouth daily.  . Multiple Vitamins-Minerals (CENTRUM SILVER 50+WOMEN PO) Take by mouth.  . simvastatin (ZOCOR) 20 MG tablet TAKE 1 TABLET BY MOUTH EVERY DAY   No facility-administered encounter medications on file as of 01/03/2020.    Allergies (verified) Patient has no known allergies.   History: Past Medical History:  Diagnosis Date  . Anxiety   . Arthritis 2005  . Hyperlipidemia   . Hypertension   . Major depressive disorder with single episode, in partial remission ([place])   . Migraine headache    1-2x/year  . Sarcoidosis    Past Surgical History:  Procedure Laterality Date  . CARDIAC CATHETERIZATION  2010   Normal  . COLONOSCOPY WITH PROPOFOL N/A 09/28/2018   Procedure: COLONOSCOPY WITH  biopsy;  Surgeon: Lucilla Lame, MD;  Location: [facility];  Service: Endoscopy;  Laterality: N/A;  . POLYPECTOMY  09/28/2018   Procedure: POLYPECTOMY;  Surgeon: Lucilla Lame, MD;  Location: [place];  Service: Endoscopy;;   Family History  Problem Relation Age of Onset  . Colon cancer Father   . Skin cancer Sister   . Breast cancer Sister 58  . Skin cancer Brother   . Breast cancer Maternal Grandmother 72  . Diabetes Neg Hx   . Heart disease Neg Hx   . Hypertension Neg Hx    Social History   Socioeconomic History  . Marital status: Married    Spouse name: Not on file  . Number of children: 1  . Years of education:  Not on file  . Highest education level: Not on file  Occupational History  . Not on file  Tobacco Use  . Smoking status: Former Smoker    Packs/day: 0.00    Years: 0.00    Pack years: 0.00    Types: Cigarettes    Quit date: 1995    Years since quitting: 26.8  . Smokeless tobacco: Never Used  Vaping Use  . Vaping Use: Never used  Substance and Sexual Activity  . Alcohol use: Yes    Alcohol/week: 24.0 standard drinks    Types: 24 Glasses of wine per week    Comment: 3-4 glasses wine/day  . Drug use: Never  . Sexual activity:  Yes    Birth control/protection: Post-menopausal  Other Topics Concern  . Not on file  Social History Narrative  . Not on file   Social Determinants of Health   Financial Resource Strain: Low Risk   . Difficulty of Paying Living Expenses: Not hard at all  Food Insecurity: No Food Insecurity  . Worried About Charity fundraiser in the Last Year: Never true  . Ran Out of Food in the Last Year: Never true  Transportation Needs: No Transportation Needs  . Lack of Transportation (Medical): No  . Lack of Transportation (Non-Medical): No  Physical Activity: Sufficiently Active  . Days of Exercise per Week: 4 days  . Minutes of Exercise per Session: 40 min  Stress: No Stress Concern Present  . Feeling of Stress : Not at all  Social Connections: Moderately Isolated  . Frequency of Communication with Friends and Family: More than three times a week  . Frequency of Social Gatherings with Friends and Family: More than three times a week  . Attends Religious Services: Never  . Active Member of Clubs or Organizations: No  . Attends Archivist Meetings: Never  . Marital Status: Married    Tobacco Counseling Counseling given: Not Answered   Clinical Intake:  Pre-visit preparation completed: Yes  Pain : No/denies pain     Nutritional Risks: None Diabetes: No  How often do you need to have someone help you when you read instructions, pamphlets, or other written materials from your doctor or pharmacy?: 1 - Never    Interpreter Needed?: No  Information entered by :: Clemetine Marker LPN   Activities of Daily Living In your present state of health, do you have any difficulty performing the following activities: 01/03/2020 11/02/2019  Hearing? N N  Comment declines hearing aids -  Vision? N N  Difficulty concentrating or making decisions? N N  Walking or climbing stairs? N N  Dressing or bathing? N -  Doing errands, shopping? N N  Preparing Food and eating ? N -  Using  the Toilet? N -  In the past six months, have you accidently leaked urine? N -  Do you have problems with loss of bowel control? N -  Managing your Medications? N -  Managing your Finances? N -  Housekeeping or managing your Housekeeping? N -  Some recent data might be hidden    Patient Care Team: Glean Hess, MD as PCP - General (Internal Medicine) Ralene Bathe, MD (Dermatology) Samara Deist, DPM as Referring Physician (Podiatry)  Indicate any recent Medical Services you may have received from other than [facility] providers in the past year (date may be approximate).     Assessment:   This is a routine wellness examination for Diana Mcdaniel.  Hearing/Vision screen  Hearing Screening   125Hz  250Hz  500Hz  1000Hz  2000Hz  3000Hz  4000Hz  6000Hz  8000Hz   Right ear:           Left ear:           Comments: Pt denies hearing difficulty   Vision Screening Comments: Annual vision screenings done at [facility]  Dietary issues and exercise activities discussed: Current Exercise Habits: Home exercise routine, Type of exercise: walking, Time (Minutes): 40, Frequency (Times/Week): 4, Weekly Exercise (Minutes/Week): 160, Intensity: Mild, Exercise limited by: None identified  Goals    . Patient Stated     Patient states she would like to maintain health and activity level      Depression Screen PHQ 2/9 Scores 01/03/2020 11/02/2019 05/03/2019 07/23/2018 05/15/2018 04/29/2018 12/01/2017  PHQ - 2 Score 0 2 0 2 2 2  0  PHQ- 9 Score - 2 0 2 7 6  -    Fall Risk Fall Risk  01/03/2020 11/02/2019 07/23/2018 05/15/2018 04/29/2018  Falls in the past year? 0 0 0 0 0  Number falls in past yr: 0 - 0 0 0  Injury with Fall? 0 - 0 0 0  Risk for fall due to : No Fall Risks No Fall Risks - - -  Follow up Falls prevention discussed Falls evaluation completed Falls evaluation completed Falls evaluation completed Falls evaluation completed    Any stairs in or around the home? Yes  If so, are there any  without handrails? No  Home free of loose throw rugs in walkways, pet beds, electrical cords, etc? Yes  Adequate lighting in your home to reduce risk of falls? Yes   ASSISTIVE DEVICES UTILIZED TO PREVENT FALLS:  Life alert? No  Use of a cane, walker or w/c? No  Grab bars in the bathroom? Yes  Shower chair or bench in shower? No  Elevated toilet seat or a handicapped toilet? No   TIMED UP AND GO:  Was the test performed? No . Telephonic visit.   Cognitive Function:     6CIT Screen 01/03/2020  What Year? 0 points  What month? 0 points  What time? 0 points  Count back from 20 0 points  Months in reverse 0 points  Repeat phrase 0 points  Total Score 0    Immunizations Immunization History  Administered Date(s) Administered  . Fluad Quad(high Dose 65+) 11/25/2018, 12/03/2019  . Influenza, High Dose Seasonal PF 01/07/2018  . Moderna SARS-COVID-2 Vaccination 04/30/2019, 05/29/2019    TDAP status: Due, Education has been provided regarding the importance of this vaccine. Advised may receive this vaccine at local pharmacy or Health Dept. Aware to provide a copy of the vaccination record if obtained from local pharmacy or Health Dept. Verbalized acceptance and understanding.   Flu Vaccine status: Up to date   Pneumococcal vaccine status: Declined,  Education has been provided regarding the importance of this vaccine but patient still declined. Advised may receive this vaccine at local pharmacy or Health Dept. Aware to provide a copy of the vaccination record if obtained from local pharmacy or Health Dept. Verbalized acceptance and understanding.    Covid-19 vaccine status: Completed vaccines  Qualifies for Shingles Vaccine? Yes   Zostavax completed No   Shingrix Completed?: No.    Education has been provided regarding the importance of this vaccine. Patient has been advised to call insurance company to determine out of pocket expense if they have not yet received this vaccine.  Advised may also receive vaccine at local pharmacy or Health Dept.  Verbalized acceptance and understanding.  Screening Tests Health Maintenance  Topic Date Due  . PNA vac Low Risk Adult (1 of 2 - PCV13) 05/02/2020 (Originally 09/13/2013)  . TETANUS/TDAP  11/01/2020 (Originally 09/14/1969)  . MAMMOGRAM  07/11/2020  . COLONOSCOPY  09/28/2023  . INFLUENZA VACCINE  Completed  . DEXA SCAN  Completed  . COVID-19 Vaccine  Completed  . Hepatitis C Screening  Completed    Health Maintenance  There are no preventive care reminders to display for this patient.  Colorectal cancer screening: Completed 09/28/18. Repeat every 5 years   Mammogram status: Completed 07/12/19. Repeat every year   Bone Density status: Completed 05/26/18. Results reflect: Bone density results: OSTEOPENIA. Repeat every 2 years.  Lung Cancer Screening: (Low Dose CT Chest recommended if Age 34-80 years, 30 pack-year currently smoking OR have quit w/in 15years.) does not qualify.   Additional Screening:  Hepatitis C Screening: does qualify; Completed 04/29/18  Vision Screening: Recommended annual ophthalmology exams for early detection of glaucoma and other disorders of the eye. Is the patient up to date with their annual eye exam?  Yes  Who is the provider or what is the name of the office in which the patient attends annual eye exams? [facility]   Dental Screening: Recommended annual dental exams for proper oral hygiene  Community Resource Referral / Chronic Care Management: CRR required this visit?  No   CCM required this visit?  No      Plan:     I have personally reviewed and noted the following in the patient's chart:   . Medical and social history . Use of alcohol, tobacco or illicit drugs  . Current medications and supplements . Functional ability and status . Nutritional status . Physical activity . Advanced directives . List of other physicians . Hospitalizations, surgeries, and ER  visits in previous 12 months . Vitals . Screenings to include cognitive, depression, and falls . Referrals and appointments  In addition, I have reviewed and discussed with patient certain preventive protocols, quality metrics, and best practice recommendations. A written personalized care plan for preventive services as well as general preventive health recommendations were provided to patient.     Clemetine Marker, LPN   [id number]   Nurse Notes: none

## 2020-01-03 NOTE — Patient Instructions (Signed)
Diana Mcdaniel , Thank you for taking time to come for your Medicare Wellness Visit. I appreciate your ongoing commitment to your health goals. Please review the following plan we discussed and let me know if I can assist you in the future.   Screening recommendations/referrals: Colonoscopy: done 09/28/18 repeat in 2025 Mammogram: done 07/12/19 Bone Density: done 05/26/18 Recommended yearly ophthalmology/optometry visit for glaucoma screening and checkup Recommended yearly dental visit for hygiene and checkup  Vaccinations: Influenza vaccine: done 12/03/19 Pneumococcal vaccine: declined Tdap vaccine: due Shingles vaccine: Shingrix discussed. Please contact your pharmacy for coverage information.  Covid-19: done 05/28/19 & 05/29/19  Advanced directives: Please bring a copy of your health care power of attorney and living will to the office at your convenience.  Conditions/risks identified: Keep up the great work!  Next appointment: Follow up in one year for your annual wellness visit    Preventive Care 65 Years and Older, Female Preventive care refers to lifestyle choices and visits with your health care provider that can promote health and wellness. What does preventive care include?  A yearly physical exam. This is also called an annual well check.  Dental exams once or twice a year.  Routine eye exams. Ask your health care provider how often you should have your eyes checked.  Personal lifestyle choices, including:  Daily care of your teeth and gums.  Regular physical activity.  Eating a healthy diet.  Avoiding tobacco and drug use.  Limiting alcohol use.  Practicing safe sex.  Taking low-dose aspirin every day.  Taking vitamin and mineral supplements as recommended by your health care provider. What happens during an annual well check? The services and screenings done by your health care provider during your annual well check will depend on your age, overall health,  lifestyle risk factors, and family history of disease. Counseling  Your health care provider may ask you questions about your:  Alcohol use.  Tobacco use.  Drug use.  Emotional well-being.  Home and relationship well-being.  Sexual activity.  Eating habits.  History of falls.  Memory and ability to understand (cognition).  Work and work Statistician.  Reproductive health. Screening  You may have the following tests or measurements:  Height, weight, and BMI.  Blood pressure.  Lipid and cholesterol levels. These may be checked every 5 years, or more frequently if you are over 41 years old.  Skin check.  Lung cancer screening. You may have this screening every year starting at age 25 if you have a 30-pack-year history of smoking and currently smoke or have quit within the past 15 years.  Fecal occult blood test (FOBT) of the stool. You may have this test every year starting at age 16.  Flexible sigmoidoscopy or colonoscopy. You may have a sigmoidoscopy every 5 years or a colonoscopy every 10 years starting at age 9.  Hepatitis C blood test.  Hepatitis B blood test.  Sexually transmitted disease (STD) testing.  Diabetes screening. This is done by checking your blood sugar (glucose) after you have not eaten for a while (fasting). You may have this done every 1-3 years.  Bone density scan. This is done to screen for osteoporosis. You may have this done starting at age 59.  Mammogram. This may be done every 1-2 years. Talk to your health care provider about how often you should have regular mammograms. Talk with your health care provider about your test results, treatment options, and if necessary, the need for more tests. Vaccines  Your  health care provider may recommend certain vaccines, such as:  Influenza vaccine. This is recommended every year.  Tetanus, diphtheria, and acellular pertussis (Tdap, Td) vaccine. You may need a Td booster every 10 years.  Zoster  vaccine. You may need this after age 62.  Pneumococcal 13-valent conjugate (PCV13) vaccine. One dose is recommended after age 56.  Pneumococcal polysaccharide (PPSV23) vaccine. One dose is recommended after age 68. Talk to your health care provider about which screenings and vaccines you need and how often you need them. This information is not intended to replace advice given to you by your health care provider. Make sure you discuss any questions you have with your health care provider. Document Released: 03/31/2015 Document Revised: 11/22/2015 Document Reviewed: 01/03/2015 Elsevier Interactive Patient Education  2017 [place] Prevention in the Home Falls can cause injuries. They can happen to people of all ages. There are many things you can do to make your home safe and to help prevent falls. What can I do on the outside of my home?  Regularly fix the edges of walkways and driveways and fix any cracks.  Remove anything that might make you trip as you walk through a door, such as a raised step or threshold.  Trim any bushes or trees on the path to your home.  Use bright outdoor lighting.  Clear any walking paths of anything that might make someone trip, such as rocks or tools.  Regularly check to see if handrails are loose or broken. Make sure that both sides of any steps have handrails.  Any raised decks and porches should have guardrails on the edges.  Have any leaves, snow, or ice cleared regularly.  Use sand or salt on walking paths during winter.  Clean up any spills in your garage right away. This includes oil or grease spills. What can I do in the bathroom?  Use night lights.  Install grab bars by the toilet and in the tub and shower. Do not use towel bars as grab bars.  Use non-skid mats or decals in the tub or shower.  If you need to sit down in the shower, use a plastic, non-slip stool.  Keep the floor dry. Clean up any water that spills on the  floor as soon as it happens.  Remove soap buildup in the tub or shower regularly.  Attach bath mats securely with double-sided non-slip rug tape.  Do not have throw rugs and other things on the floor that can make you trip. What can I do in the bedroom?  Use night lights.  Make sure that you have a light by your bed that is easy to reach.  Do not use any sheets or blankets that are too big for your bed. They should not hang down onto the floor.  Have a firm chair that has side arms. You can use this for support while you get dressed.  Do not have throw rugs and other things on the floor that can make you trip. What can I do in the kitchen?  Clean up any spills right away.  Avoid walking on wet floors.  Keep items that you use a lot in easy-to-reach places.  If you need to reach something above you, use a strong step stool that has a grab bar.  Keep electrical cords out of the way.  Do not use floor polish or wax that makes floors slippery. If you must use wax, use non-skid floor wax.  Do not  have throw rugs and other things on the floor that can make you trip. What can I do with my stairs?  Do not leave any items on the stairs.  Make sure that there are handrails on both sides of the stairs and use them. Fix handrails that are broken or loose. Make sure that handrails are as long as the stairways.  Check any carpeting to make sure that it is firmly attached to the stairs. Fix any carpet that is loose or worn.  Avoid having throw rugs at the top or bottom of the stairs. If you do have throw rugs, attach them to the floor with carpet tape.  Make sure that you have a light switch at the top of the stairs and the bottom of the stairs. If you do not have them, ask someone to add them for you. What else can I do to help prevent falls?  Wear shoes that:  Do not have high heels.  Have rubber bottoms.  Are comfortable and fit you well.  Are closed at the toe. Do not wear  sandals.  If you use a stepladder:  Make sure that it is fully opened. Do not climb a closed stepladder.  Make sure that both sides of the stepladder are locked into place.  Ask someone to hold it for you, if possible.  Clearly mark and make sure that you can see:  Any grab bars or handrails.  First and last steps.  Where the edge of each step is.  Use tools that help you move around (mobility aids) if they are needed. These include:  Canes.  Walkers.  Scooters.  Crutches.  Turn on the lights when you go into a dark area. Replace any light bulbs as soon as they burn out.  Set up your furniture so you have a clear path. Avoid moving your furniture around.  If any of your floors are uneven, fix them.  If there are any pets around you, be aware of where they are.  Review your medicines with your doctor. Some medicines can make you feel dizzy. This can increase your chance of falling. Ask your doctor what other things that you can do to help prevent falls. This information is not intended to replace advice given to you by your health care provider. Make sure you discuss any questions you have with your health care provider. Document Released: 12/29/2008 Document Revised: 08/10/2015 Document Reviewed: 04/08/2014 Elsevier Interactive Patient Education  2017 [employer].

## 2020-01-27 ENCOUNTER — Ambulatory Visit: Payer: Medicare PPO | Admitting: Dermatology

## 2020-01-27 ENCOUNTER — Other Ambulatory Visit: Payer: Self-pay

## 2020-01-27 DIAGNOSIS — E882 Lipomatosis, not elsewhere classified: Secondary | ICD-10-CM

## 2020-01-27 DIAGNOSIS — L578 Other skin changes due to chronic exposure to nonionizing radiation: Secondary | ICD-10-CM

## 2020-01-27 DIAGNOSIS — L820 Inflamed seborrheic keratosis: Secondary | ICD-10-CM | POA: Diagnosis not present

## 2020-01-27 DIAGNOSIS — L719 Rosacea, unspecified: Secondary | ICD-10-CM | POA: Diagnosis not present

## 2020-01-27 DIAGNOSIS — L300 Nummular dermatitis: Secondary | ICD-10-CM

## 2020-01-27 DIAGNOSIS — L814 Other melanin hyperpigmentation: Secondary | ICD-10-CM

## 2020-01-27 DIAGNOSIS — L720 Epidermal cyst: Secondary | ICD-10-CM | POA: Diagnosis not present

## 2020-01-27 DIAGNOSIS — L601 Onycholysis: Secondary | ICD-10-CM | POA: Diagnosis not present

## 2020-01-27 DIAGNOSIS — D1800 Hemangioma unspecified site: Secondary | ICD-10-CM

## 2020-01-27 DIAGNOSIS — Z1283 Encounter for screening for malignant neoplasm of skin: Secondary | ICD-10-CM | POA: Diagnosis not present

## 2020-01-27 DIAGNOSIS — L821 Other seborrheic keratosis: Secondary | ICD-10-CM

## 2020-01-27 DIAGNOSIS — D229 Melanocytic nevi, unspecified: Secondary | ICD-10-CM

## 2020-01-27 MED ORDER — MOMETASONE FUROATE 0.1 % EX CREA: 1.0000 "application " | TOPICAL_CREAM | Freq: Every day | CUTANEOUS | 2 refills | Status: DC | PRN

## 2020-01-27 NOTE — Patient Instructions (Signed)

## 2020-01-27 NOTE — Progress Notes (Signed)
Follow-Up Visit   Subjective  Diana Mcdaniel is a 69 y.o. female who presents for the following: Annual Exam (No history of skin cancer - TBSE today) and Other (Spots of scalp, right axilla anr red splotches of legs and chest) The patient presents for Total-Body Skin Exam (TBSE) for skin cancer screening and mole check.  The following portions of the chart were reviewed this encounter and updated as appropriate:  Tobacco  Allergies  Meds  Problems  Med Hx  Surg Hx  Fam Hx     Review of Systems:  No other skin or systemic complaints except as noted in HPI or Assessment and Plan.  Objective  Well appearing patient in no apparent distress; mood and affect are within normal limits.  A full examination was performed including scalp, head, eyes, ears, nose, lips, neck, chest, axillae, abdomen, back, buttocks, bilateral upper extremities, bilateral lower extremities, hands, feet, fingers, toes, fingernails, and toenails. All findings within normal limits unless otherwise noted below.  Objective  Left 4th Finger Nail Plate: Onycholysis  Objective  Arms: Pink patches of arms  Objective  Arms, legs: Rubbery nodules.  Objective  Face: Smooth white papule(s).   Objective  Face: Erythema  Objective  Right ant axillary fold: 1.2 cm flesh colored papule   Assessment & Plan    Lentigines - Scattered tan macules - Discussed due to sun exposure - Benign, observe - Call for any changes  Seborrheic Keratoses - Stuck-on, waxy, tan-brown papules and plaques  - Discussed benign etiology and prognosis. - Observe - Call for any changes  Melanocytic Nevi - Tan-brown and/or pink-flesh-colored symmetric macules and papules - Benign appearing on exam today - Observation - Call clinic for new or changing moles - Recommend daily use of broad spectrum spf 30+ sunscreen to sun-exposed areas.   Hemangiomas - Red papules - Discussed benign nature - Observe - Call for any  changes  Actinic Damage - Chronic, secondary to cumulative UV/sun exposure - diffuse scaly erythematous macules with underlying dyspigmentation - Recommend daily broad spectrum sunscreen SPF 30+ to sun-exposed areas, reapply every 2 hours as needed.  - Call for new or changing lesions.  Skin cancer screening performed today.  Onycholysis Left 4th Finger Nail Plate No specific treatment.  Time may or may not improve. Related to trauma  Nummular dermatitis -chronic persistent Arms Start Mometasone cream qd prn rash mometasone (ELOCON) 0.1 % cream - Arms  Familial multiple lipomatosis Arms, legs Rubbery nodules of the arms and legs Benign, observe.   Milia Face Consider retinoids.   Benign, observe.   Rosacea Face Erythrotelangiectatic type - Discussed BBL/laser treatments Rosacea is a chronic progressive skin condition usually affecting the face of adults. It is treatable but not curable. It sometimes affects the eyes (ocular rosacea) as well. It may respond to topical and/or systemic medication and can flare with stress, sun exposure, alcohol, exercise and some foods.  Inflamed seborrheic keratosis Right ant axillary fold  Epidermal / dermal shaving - Right ant axillary fold  Lesion diameter (cm):  1.2 Informed consent: discussed and consent obtained   Patient was prepped and draped in usual sterile fashion: Area prepped with alcohol. Anesthesia: the lesion was anesthetized in a standard fashion   Anesthetic:  1% lidocaine w/ epinephrine 1-100,000 buffered w/ 8.4% NaHCO3 Instrument used: scissors   Hemostasis achieved with: pressure, aluminum chloride and electrodesiccation   Outcome: patient tolerated procedure well   Post-procedure details: wound care instructions given    Return  in about 1 year (around 01/26/2021) for TBSE.  I, Ashok Cordia, CMA, am acting as scribe for Sarina Ser, MD .  Documentation: I have reviewed the above documentation for accuracy and  completeness, and I agree with the above.  Sarina Ser, MD

## 2020-01-31 DIAGNOSIS — H21233 Degeneration of iris (pigmentary), bilateral: Secondary | ICD-10-CM | POA: Diagnosis not present

## 2020-02-01 ENCOUNTER — Encounter: Payer: Self-pay | Admitting: Dermatology

## 2020-02-25 ENCOUNTER — Other Ambulatory Visit: Payer: Self-pay | Admitting: Internal Medicine

## 2020-02-25 DIAGNOSIS — I10 Essential (primary) hypertension: Secondary | ICD-10-CM

## 2020-02-28 DIAGNOSIS — M19071 Primary osteoarthritis, right ankle and foot: Secondary | ICD-10-CM | POA: Diagnosis not present

## 2020-03-24 DIAGNOSIS — M19071 Primary osteoarthritis, right ankle and foot: Secondary | ICD-10-CM | POA: Diagnosis not present

## 2020-03-25 ENCOUNTER — Other Ambulatory Visit: Payer: Self-pay | Admitting: Internal Medicine

## 2020-03-25 DIAGNOSIS — F419 Anxiety disorder, unspecified: Secondary | ICD-10-CM

## 2020-04-10 DIAGNOSIS — M19071 Primary osteoarthritis, right ankle and foot: Secondary | ICD-10-CM | POA: Diagnosis not present

## 2020-04-10 DIAGNOSIS — M79671 Pain in right foot: Secondary | ICD-10-CM | POA: Diagnosis not present

## 2020-05-04 ENCOUNTER — Ambulatory Visit
Admission: RE | Admit: 2020-05-04 | Discharge: 2020-05-04 | Disposition: A | Discharge status: Home / Self Care | Payer: Medicare PPO | Attending: Internal Medicine | Admitting: Internal Medicine

## 2020-05-04 ENCOUNTER — Ambulatory Visit
Admission: RE | Admit: 2020-05-04 | Discharge: 2020-05-04 | Disposition: A | Discharge status: Home / Self Care | Payer: Medicare PPO | Source: Ambulatory Visit | Attending: Internal Medicine | Admitting: Internal Medicine

## 2020-05-04 ENCOUNTER — Ambulatory Visit (INDEPENDENT_AMBULATORY_CARE_PROVIDER_SITE_OTHER): Payer: Medicare PPO | Admitting: Internal Medicine

## 2020-05-04 ENCOUNTER — Other Ambulatory Visit: Payer: Self-pay

## 2020-05-04 ENCOUNTER — Encounter: Payer: Self-pay | Admitting: Internal Medicine

## 2020-05-04 VITALS — BP 100/66 | HR 61 | Temp 97.7°F | Ht 68.0 in | Wt 208.0 lb

## 2020-05-04 DIAGNOSIS — Z0000 Encounter for general adult medical examination without abnormal findings: Secondary | ICD-10-CM | POA: Diagnosis not present

## 2020-05-04 DIAGNOSIS — M8580 Other specified disorders of bone density and structure, unspecified site: Secondary | ICD-10-CM

## 2020-05-04 DIAGNOSIS — Z13820 Encounter for screening for osteoporosis: Secondary | ICD-10-CM | POA: Diagnosis not present

## 2020-05-04 DIAGNOSIS — R7309 Other abnormal glucose: Secondary | ICD-10-CM | POA: Diagnosis not present

## 2020-05-04 DIAGNOSIS — Z1231 Encounter for screening mammogram for malignant neoplasm of breast: Secondary | ICD-10-CM

## 2020-05-04 DIAGNOSIS — E782 Mixed hyperlipidemia: Secondary | ICD-10-CM

## 2020-05-04 DIAGNOSIS — D860 Sarcoidosis of lung: Secondary | ICD-10-CM | POA: Diagnosis not present

## 2020-05-04 DIAGNOSIS — F324 Major depressive disorder, single episode, in partial remission: Secondary | ICD-10-CM

## 2020-05-04 DIAGNOSIS — R062 Wheezing: Secondary | ICD-10-CM | POA: Diagnosis not present

## 2020-05-04 DIAGNOSIS — I10 Essential (primary) hypertension: Secondary | ICD-10-CM

## 2020-05-04 DIAGNOSIS — Z23 Encounter for immunization: Secondary | ICD-10-CM

## 2020-05-04 DIAGNOSIS — D869 Sarcoidosis, unspecified: Secondary | ICD-10-CM | POA: Diagnosis not present

## 2020-05-04 LAB — POCT URINALYSIS DIPSTICK
Bilirubin, UA: NEGATIVE
Blood, UA: NEGATIVE
Glucose, UA: NEGATIVE
Ketones, UA: 5
Nitrite, UA: NEGATIVE
Protein, UA: POSITIVE — AB
Spec Grav, UA: 1.02 (ref 1.010–1.025)
Urobilinogen, UA: 0.2 E.U./dL
pH, UA: 6 (ref 5.0–8.0)

## 2020-05-04 MED ORDER — ALBUTEROL SULFATE HFA 108 (90 BASE) MCG/ACT IN AERS
2.0000 | INHALATION_SPRAY | Freq: Four times a day (QID) | RESPIRATORY_TRACT | 1 refills | Status: DC | PRN
Start: 2020-05-04 — End: 2023-05-22

## 2020-05-04 NOTE — Progress Notes (Signed)
Date:  05/04/2020   Name:  Diana Mcdaniel   DOB:  May 16, 1950   MRN:  [id number]   Chief Complaint: Annual Exam (Breast exam no pap)  Diana Mcdaniel is a 70 y.o. female who presents today for her Complete Annual Exam. She feels well. She reports exercising walking X3 days a week. She reports she is sleeping well. Breast complaints none.  Pneumonia vaccines due - pt believes that she has had these.  Mammogram: 06/2019 DEXA: 05/2018 osteopenia hip/normal spine Pap smear: discontinued Colonoscopy: 09/2018 repeat 2025  Immunization History  Administered Date(s) Administered  . Fluad Quad(high Dose 65+) 11/25/2018, 12/03/2019  . Influenza, High Dose Seasonal PF 01/07/2018  . Moderna Sars-Covid-2 Vaccination 04/30/2019, 05/29/2019, 01/07/2020    Hypertension This is a chronic problem. The problem is controlled. Pertinent negatives include no chest pain, headaches, palpitations or shortness of breath. Past treatments include angiotensin blockers and beta blockers. The current treatment provides significant improvement. There is no history of kidney disease, CAD/MI or CVA.  Hyperlipidemia The problem is controlled. Pertinent negatives include no chest pain or shortness of breath. Current antihyperlipidemic treatment includes statins. The current treatment provides significant improvement of lipids.  Depression        This is a chronic problem.The problem is unchanged.  Associated symptoms include no fatigue and no headaches.  Past treatments include SSRIs - Selective serotonin reuptake inhibitors and other medications.   Lab Results  Component Value Date   CREATININE 0.78 05/03/2019   BUN 11 05/03/2019   NA 135 05/03/2019   K 5.4 (H) 05/03/2019   CL 97 05/03/2019   CO2 27 05/03/2019   Lab Results  Component Value Date   CHOL 199 05/03/2019   HDL 72 05/03/2019   LDLCALC 108 (H) 05/03/2019   TRIG 106 05/03/2019   CHOLHDL 2.8 05/03/2019   Lab Results  Component Value Date    TSH 4.950 (H) 05/03/2019   No results found for: HGBA1C Lab Results  Component Value Date   WBC 4.4 05/03/2019   HGB 13.0 05/03/2019   HCT 37.2 05/03/2019   MCV 89 05/03/2019   PLT 286 05/03/2019   Lab Results  Component Value Date   ALT 26 05/03/2019   AST 30 05/03/2019   ALKPHOS 89 05/03/2019   BILITOT 0.5 05/03/2019     Review of Systems  Constitutional: Negative for chills, fatigue and fever.  HENT: Negative for congestion, hearing loss, tinnitus, trouble swallowing and voice change.   Eyes: Negative for visual disturbance.  Respiratory: Negative for cough, chest tightness, shortness of breath and wheezing.   Cardiovascular: Negative for chest pain, palpitations and leg swelling.  Gastrointestinal: Negative for abdominal pain, constipation, diarrhea and vomiting.  Endocrine: Negative for polydipsia and polyuria.  Genitourinary: Negative for dysuria, frequency, genital sores, vaginal bleeding and vaginal discharge.  Musculoskeletal: Positive for arthralgias (right foot and toes). Negative for gait problem and joint swelling.  Skin: Negative for color change and rash.       Diffuse hair loss over 4-5 months  Neurological: Negative for dizziness, tremors, light-headedness and headaches.  Hematological: Negative for adenopathy. Does not bruise/bleed easily.  Psychiatric/Behavioral: Positive for depression. Negative for dysphoric mood and sleep disturbance. The patient is not nervous/anxious.     Patient Active Problem List   Diagnosis Date Noted  . Elevated TSH 11/02/2019  . Primary insomnia 05/03/2019  . Family history of colon cancer in father   . Polyp of sigmoid colon   . Osteopenia  determined by x-ray 05/26/2018  . Sarcoidosis of lung ([place]) 04/29/2018  . Migraine headache with aura 04/29/2018  . Tremor, essential 04/29/2018  . Panic disorder 04/29/2018  . Essential hypertension 12/01/2017  . Mixed hyperlipidemia 12/01/2017  . Ganglion cyst of foot 12/01/2017  .  Major depressive disorder with single episode, in partial remission ([place]) 12/01/2017  . Primary osteoarthritis of left hip 12/01/2017    No Known Allergies  Past Surgical History:  Procedure Laterality Date  . CARDIAC CATHETERIZATION  2010   Normal  . COLONOSCOPY WITH PROPOFOL N/A 09/28/2018   Procedure: COLONOSCOPY WITH  biopsy;  Surgeon: Lucilla Lame, MD;  Location: [place];  Service: Endoscopy;  Laterality: N/A;  . POLYPECTOMY  09/28/2018   Procedure: POLYPECTOMY;  Surgeon: Lucilla Lame, MD;  Location: [facility];  Service: Endoscopy;;    Social History   Tobacco Use  . Smoking status: Former Smoker    Packs/day: 0.00    Years: 0.00    Pack years: 0.00    Types: Cigarettes    Quit date: 1995    Years since quitting: 27.1  . Smokeless tobacco: Never Used  Vaping Use  . Vaping Use: Never used  Substance Use Topics  . Alcohol use: Yes    Alcohol/week: 24.0 standard drinks    Types: 24 Glasses of wine per week    Comment: 3-4 glasses wine/day  . Drug use: Never     Medication list has been reviewed and updated.  Current Meds  Medication Sig  . atenolol (TENORMIN) 25 MG tablet TAKE 1 TABLET BY MOUTH EVERY DAY  . Calcium-Magnesium-Vitamin D (CALCIUM 1200+D3 PO) Take by mouth daily.  . clonazePAM (KLONOPIN) 0.5 MG tablet Take 1 tablet (0.5 mg total) by mouth 2 (two) times daily as needed for anxiety.  Marland Kitchen FLUoxetine (PROZAC) 20 MG capsule TAKE 1 CAPSULE BY MOUTH EVERY DAY  . losartan (COZAAR) 25 MG tablet Take 1 tablet (25 mg total) by mouth daily.  . mometasone (ELOCON) 0.1 % cream Apply 1 application topically daily as needed (Rash).  . Multiple Vitamins-Minerals (CENTRUM SILVER 50+WOMEN PO) Take by mouth.  . simvastatin (ZOCOR) 20 MG tablet TAKE 1 TABLET BY MOUTH EVERY DAY    PHQ 2/9 Scores 05/04/2020 01/03/2020 11/02/2019 05/03/2019  PHQ - 2 Score 0 0 2 0  PHQ- 9 Score 0 - 2 0    GAD 7 : Generalized Anxiety Score 05/04/2020 11/02/2019 07/23/2018  05/15/2018  Nervous, Anxious, on Edge 1 1 0 3  Control/stop worrying 1 1 0 0  Worry too much - different things 1 1 0 0  Trouble relaxing 0 0 0 3  Restless 0 0 0 3  Easily annoyed or irritable 0 0 0 0  Afraid - awful might happen 0 1 0 0  Total GAD 7 Score 3 4 0 9  Anxiety Difficulty - Not difficult at all Not difficult at all Very difficult    BP Readings from Last 3 Encounters:  05/04/20 100/66  11/02/19 118/72  05/03/19 126/78    Physical Exam Vitals and nursing note reviewed.  Constitutional:      General: She is not in acute distress.    Appearance: She is well-developed.  HENT:     Head: Normocephalic and atraumatic.     Right Ear: Tympanic membrane and ear canal normal.     Left Ear: Tympanic membrane and ear canal normal.     Nose:     Right Sinus: No maxillary sinus tenderness.  Left Sinus: No maxillary sinus tenderness.  Eyes:     General: No scleral icterus.       Right eye: No discharge.        Left eye: No discharge.     Conjunctiva/sclera: Conjunctivae normal.  Neck:     Thyroid: No thyromegaly.     Vascular: No carotid bruit.  Cardiovascular:     Rate and Rhythm: Normal rate and regular rhythm.     Pulses: Normal pulses.     Heart sounds: Normal heart sounds.  Pulmonary:     Effort: Pulmonary effort is normal. No respiratory distress.     Breath sounds: Normal breath sounds. No wheezing or rhonchi.  Chest:  Breasts:     Right: No mass, nipple discharge, skin change or tenderness.     Left: No mass, nipple discharge, skin change or tenderness.    Abdominal:     General: Bowel sounds are normal.     Palpations: Abdomen is soft.     Tenderness: There is no abdominal tenderness.  Musculoskeletal:     Cervical back: Normal range of motion. No erythema.     Right lower leg: No edema.     Left lower leg: No edema.  Lymphadenopathy:     Cervical: No cervical adenopathy.  Skin:    General: Skin is warm and dry.     Findings: No rash.      Comments: Scalp normal - no alopecia; mild thinning hair  Neurological:     Mental Status: She is alert and oriented to person, place, and time.     Cranial Nerves: No cranial nerve deficit.     Sensory: No sensory deficit.     Deep Tendon Reflexes: Reflexes are normal and symmetric.  Psychiatric:        Attention and Perception: Attention normal.        Mood and Affect: Mood normal.        Behavior: Behavior normal.     Wt Readings from Last 3 Encounters:  05/04/20 208 lb (94.3 kg)  11/02/19 205 lb (93 kg)  05/03/19 205 lb (93 kg)    BP 100/66   Pulse 61   Temp 97.7 F (36.5 C) (Oral)   Ht 5\' 8"  (1.727 m)   Wt 208 lb (94.3 kg)   SpO2 97%   BMI 31.63 kg/m   Assessment and Plan: 1. Annual physical exam Exam is normal except for weight. Encourage regular exercise and appropriate dietary changes. Hair loss is likely cyclical and should resolve  2. Encounter for screening mammogram for breast cancer Schedule at [facility] with DEXA - MM 3D SCREEN BREAST BILATERAL; Future  3. Encounter for screening for osteoporosis Schedule with mammo - DG Bone Density; Future  4. Essential hypertension Clinically stable exam with well controlled BP. Tolerating medications without side effects at this time. Pt to continue current regimen and low sodium diet; benefits of regular exercise as able discussed. - CBC with Differential/Platelet - Comprehensive metabolic panel - POCT urinalysis dipstick - poor collection technique; no sx of UTI so will not treat  5. Major depressive disorder with single episode, in partial remission (HCC) Clinically stable on current regimen with good control of symptoms, No SI or HI. Will continue current therapy. - TSH + free T4  6. Mixed hyperlipidemia On moderate intensity statin without side effects - Lipid panel  7. Osteopenia determined by x-ray Due for repeat DEXA - VITAMIN D 25 Hydroxy (Vit-D Deficiency, Fractures)  8. Sarcoidosis  of lung  ([place]) Minimally symptomatic; has not had CXR is several years Resume albuterol PRN - esp prior to exercise - DG Chest 2 View; Future - albuterol (VENTOLIN HFA) 108 (90 Base) MCG/ACT inhaler; Inhale 2 puffs into the lungs every 6 (six) hours as needed for wheezing or shortness of breath.  Dispense: 1 each; Refill: 1  9. Need for vaccination for pneumococcus - Pneumococcal polysaccharide vaccine 23-valent greater than or equal to 2yo subcutaneous/IM   Partially dictated using Editor, commissioning. Any errors are unintentional.  Halina Maidens, MD [facility]  05/04/2020

## 2020-05-05 LAB — CBC WITH DIFFERENTIAL/PLATELET
Basophils Absolute: 0 10*3/uL (ref 0.0–0.2)
Basos: 0 %
EOS (ABSOLUTE): 0.1 10*3/uL (ref 0.0–0.4)
Eos: 1 %
Hematocrit: 39.1 % (ref 34.0–46.6)
Hemoglobin: 13.3 g/dL (ref 11.1–15.9)
Immature Grans (Abs): 0 10*3/uL (ref 0.0–0.1)
Immature Granulocytes: 0 %
Lymphocytes Absolute: 0.9 10*3/uL (ref 0.7–3.1)
Lymphs: 13 %
MCH: 30.5 pg (ref 26.6–33.0)
MCHC: 34 g/dL (ref 31.5–35.7)
MCV: 90 fL (ref 79–97)
Monocytes Absolute: 0.8 10*3/uL (ref 0.1–0.9)
Monocytes: 11 %
Neutrophils Absolute: 5.2 10*3/uL (ref 1.4–7.0)
Neutrophils: 75 %
Platelets: 291 10*3/uL (ref 150–450)
RBC: 4.36 x10E6/uL (ref 3.77–5.28)
RDW: 11.8 % (ref 11.7–15.4)
WBC: 7 10*3/uL (ref 3.4–10.8)

## 2020-05-05 LAB — COMPREHENSIVE METABOLIC PANEL
ALT: 19 IU/L (ref 0–32)
AST: 20 IU/L (ref 0–40)
Albumin/Globulin Ratio: 1.6 (ref 1.2–2.2)
Albumin: 4.4 g/dL (ref 3.7–4.7)
Alkaline Phosphatase: 84 IU/L (ref 44–121)
BUN/Creatinine Ratio: 13 (ref 12–28)
BUN: 11 mg/dL (ref 8–27)
Bilirubin Total: 0.7 mg/dL (ref 0.0–1.2)
CO2: 22 mmol/L (ref 20–29)
Calcium: 9.8 mg/dL (ref 8.7–10.3)
Chloride: 96 mmol/L (ref 96–106)
Creatinine, Ser: 0.85 mg/dL (ref 0.57–1.00)
GFR calc Af Amer: 80 mL/min/{1.73_m2} (ref >59)
GFR calc non Af Amer: 69 mL/min/{1.73_m2} (ref >59)
Globulin, Total: 2.7 g/dL (ref 1.5–4.5)
Glucose: 107 mg/dL — ABNORMAL HIGH (ref 65–99)
Potassium: 4.7 mmol/L (ref 3.5–5.2)
Sodium: 136 mmol/L (ref 134–144)
Total Protein: 7.1 g/dL (ref 6.0–8.5)

## 2020-05-05 LAB — LIPID PANEL
Chol/HDL Ratio: 2.8 ratio (ref 0.0–4.4)
Cholesterol, Total: 217 mg/dL — ABNORMAL HIGH (ref 100–199)
HDL: 77 mg/dL (ref >39)
LDL Chol Calc (NIH): 120 mg/dL — ABNORMAL HIGH (ref 0–99)
Triglycerides: 115 mg/dL (ref 0–149)
VLDL Cholesterol Cal: 20 mg/dL (ref 5–40)

## 2020-05-05 LAB — TSH+FREE T4
Free T4: 1.16 ng/dL (ref 0.82–1.77)
TSH: 4.21 u[IU]/mL (ref 0.450–4.500)

## 2020-05-05 LAB — VITAMIN D 25 HYDROXY (VIT D DEFICIENCY, FRACTURES): Vit D, 25-Hydroxy: 41.8 ng/mL (ref 30.0–100.0)

## 2020-05-06 LAB — SPECIMEN STATUS REPORT

## 2020-05-06 LAB — HGB A1C W/O EAG: Hgb A1c MFr Bld: 5.5 % (ref 4.8–5.6)

## 2020-06-05 ENCOUNTER — Other Ambulatory Visit: Payer: Self-pay | Admitting: Internal Medicine

## 2020-06-05 DIAGNOSIS — F410 Panic disorder [episodic paroxysmal anxiety] without agoraphobia: Secondary | ICD-10-CM

## 2020-06-05 NOTE — Telephone Encounter (Signed)
Please review. Last office visit 05/04/2020.  KP

## 2020-06-05 NOTE — Telephone Encounter (Signed)
Please review. Last office visit 05/04/2020.  KP

## 2020-06-05 NOTE — Telephone Encounter (Signed)
Requested medication (s) are due for refill today: yes  Requested medication (s) are on the active medication list: yes  Last refill:  04/24/2020  Future visit scheduled:no  Notes to clinic:  this refill cannot be delegated    Requested Prescriptions  Pending Prescriptions Disp Refills   clonazePAM (KLONOPIN) 0.5 MG tablet [Pharmacy Med Name: CLONAZEPAM 0.5 MG TABLET] 20 tablet     Sig: Take 1 tablet (0.5 mg total) by mouth 2 (two) times daily as needed for anxiety.      Not Delegated - Psychiatry:  Anxiolytics/Hypnotics Failed - 06/05/2020 10:42 AM      Failed - This refill cannot be delegated      Failed - Urine Drug Screen completed in last 360 days      Passed - Valid encounter within last 6 months    Recent Outpatient Visits           1 month ago Annual physical exam   [facility] Glean Hess, MD   7 months ago Essential hypertension   [facility] Glean Hess, MD   1 year ago Annual physical exam   [facility] Glean Hess, MD   1 year ago Major depressive disorder with single episode, in partial remission [facility])   [facility] Glean Hess, MD   2 years ago Anxiety disorder, unspecified type   [facility] Glean Hess, MD       Future Appointments             In 11 months [facility] Melia Jesse Sans, MD [facility], [facility]

## 2020-06-20 ENCOUNTER — Other Ambulatory Visit: Payer: Self-pay | Admitting: Internal Medicine

## 2020-06-20 DIAGNOSIS — I10 Essential (primary) hypertension: Secondary | ICD-10-CM

## 2020-07-12 ENCOUNTER — Other Ambulatory Visit: Payer: Self-pay

## 2020-07-12 ENCOUNTER — Ambulatory Visit
Admission: RE | Admit: 2020-07-12 | Discharge: 2020-07-12 | Disposition: A | Discharge status: Home / Self Care | Payer: Medicare PPO | Source: Ambulatory Visit | Attending: Internal Medicine | Admitting: Internal Medicine

## 2020-07-12 DIAGNOSIS — Z1231 Encounter for screening mammogram for malignant neoplasm of breast: Secondary | ICD-10-CM | POA: Insufficient documentation

## 2020-07-12 DIAGNOSIS — M069 Rheumatoid arthritis, unspecified: Secondary | ICD-10-CM | POA: Insufficient documentation

## 2020-07-12 DIAGNOSIS — Z13820 Encounter for screening for osteoporosis: Secondary | ICD-10-CM

## 2020-07-12 DIAGNOSIS — M85851 Other specified disorders of bone density and structure, right thigh: Secondary | ICD-10-CM | POA: Diagnosis not present

## 2020-07-12 DIAGNOSIS — Z780 Asymptomatic menopausal state: Secondary | ICD-10-CM | POA: Insufficient documentation

## 2020-07-16 ENCOUNTER — Other Ambulatory Visit: Payer: Self-pay | Admitting: Internal Medicine

## 2020-07-16 DIAGNOSIS — I10 Essential (primary) hypertension: Secondary | ICD-10-CM

## 2020-07-16 NOTE — Telephone Encounter (Signed)
Requested Prescriptions  Pending Prescriptions Disp Refills  . losartan (COZAAR) 25 MG tablet [Pharmacy Med Name: LOSARTAN POTASSIUM 25 MG TAB] 90 tablet 1    Sig: TAKE 1 TABLET BY MOUTH EVERY DAY     Cardiovascular:  Angiotensin Receptor Blockers Passed - 07/16/2020 10:32 AM      Passed - Cr in normal range and within 180 days    Creatinine, Ser  Date Value Ref Range Status  05/04/2020 0.85 0.57 - 1.00 mg/dL Final         Passed - K in normal range and within 180 days    Potassium  Date Value Ref Range Status  05/04/2020 4.7 3.5 - 5.2 mmol/L Final         Passed - Patient is not pregnant      Passed - Last BP in normal range    BP Readings from Last 1 Encounters:  05/04/20 100/66         Passed - Valid encounter within last 6 months    Recent Outpatient Visits          2 months ago Annual physical exam   [facility] Glean Hess, MD   8 months ago Essential hypertension   [facility] Glean Hess, MD   1 year ago Annual physical exam   [facility] Glean Hess, MD   1 year ago Major depressive disorder with single episode, in partial remission [facility])   [facility] Glean Hess, MD   2 years ago Anxiety disorder, unspecified type   [facility] Glean Hess, MD      Future Appointments            In 9 months Army Melia Jesse Sans, MD [facility], [facility]

## 2020-07-29 ENCOUNTER — Telehealth: Payer: Self-pay | Admitting: Internal Medicine

## 2020-07-29 DIAGNOSIS — E782 Mixed hyperlipidemia: Secondary | ICD-10-CM

## 2020-07-31 NOTE — Telephone Encounter (Signed)
Left voicemail for patient to set up follow up for hypertension

## 2020-08-01 NOTE — Telephone Encounter (Signed)
Patient scheduled with PCP for 11/01/2020 at 1:40pm

## 2020-10-25 ENCOUNTER — Other Ambulatory Visit: Payer: Self-pay | Admitting: Internal Medicine

## 2020-10-25 DIAGNOSIS — F419 Anxiety disorder, unspecified: Secondary | ICD-10-CM

## 2020-10-25 DIAGNOSIS — F410 Panic disorder [episodic paroxysmal anxiety] without agoraphobia: Secondary | ICD-10-CM

## 2020-10-25 DIAGNOSIS — E782 Mixed hyperlipidemia: Secondary | ICD-10-CM

## 2020-10-25 DIAGNOSIS — I10 Essential (primary) hypertension: Secondary | ICD-10-CM

## 2020-10-25 NOTE — Telephone Encounter (Signed)
Requested medication (s) are due for refill today: no  Requested medication (s) are on the active medication list :yes  Future visit scheduled: yes   Notes to clinic:  Patient has appt on 11/01/2020 Review for future refills   Requested Prescriptions  Pending Prescriptions Disp Refills   simvastatin (ZOCOR) 20 MG tablet [Pharmacy Med Name: SIMVASTATIN 20 MG TABLET] 90 tablet 0    Sig: TAKE 1 TABLET BY MOUTH EVERY DAY      Cardiovascular:  Antilipid - Statins Failed - 10/25/2020 11:51 AM      Failed - Total Cholesterol in normal range and within 360 days    Cholesterol, Total  Date Value Ref Range Status  05/04/2020 217 (H) 100 - 199 mg/dL Final          Failed - LDL in normal range and within 360 days    LDL Chol Calc (NIH)  Date Value Ref Range Status  05/04/2020 120 (H) 0 - 99 mg/dL Final          Passed - HDL in normal range and within 360 days    HDL  Date Value Ref Range Status  05/04/2020 77 >39 mg/dL Final          Passed - Triglycerides in normal range and within 360 days    Triglycerides  Date Value Ref Range Status  05/04/2020 115 0 - 149 mg/dL Final          Passed - Patient is not pregnant      Passed - Valid encounter within last 12 months    Recent Outpatient Visits           5 months ago Annual physical exam   [facility] Glean Hess, MD   11 months ago Essential hypertension   [place], Laura H, MD   1 year ago Annual physical exam   [facility] Glean Hess, MD   2 years ago Major depressive disorder with single episode, in partial remission [facility])   [place], Laura H, MD   2 years ago Anxiety disorder, unspecified type   [facility] Glean Hess, MD       Future Appointments             In 1 week Army Melia Jesse Sans, MD [facility], [place]   In 6 months Army Melia Jesse Sans, MD [facility], [facility]               FLUoxetine  (PROZAC) 20 MG capsule [Pharmacy Med Name: FLUOXETINE HCL 20 MG CAPSULE] 90 capsule 1    Sig: TAKE 1 [place]      Psychiatry:  Antidepressants - SSRI Passed - 10/25/2020 11:51 AM      Passed - Completed PHQ-2 or PHQ-9 in the last 360 days      Passed - Valid encounter within last 6 months    Recent Outpatient Visits           5 months ago Annual physical exam   [facility] Glean Hess, MD   11 months ago Essential hypertension   [facility] Glean Hess, MD   1 year ago Annual physical exam   [facility] Glean Hess, MD   2 years ago Major depressive disorder with single episode, in partial remission [facility])   [facility] Glean Hess, MD   2 years ago Anxiety disorder,  unspecified type   [facility] Glean Hess, MD       Future Appointments             In 1 week Army Melia Jesse Sans, MD [facility], [place]   In 6 months Army Melia Jesse Sans, MD [facility], [facility]               clonazePAM (KLONOPIN) 0.5 MG tablet [Pharmacy Med Name: CLONAZEPAM 0.5 MG TABLET] 20 tablet 2    Sig: TAKE 1 TABLET BY MOUTH 2 TIMES DAILY AS NEEDED FOR ANXIETY.      Not Delegated - Psychiatry:  Anxiolytics/Hypnotics Failed - 10/25/2020 11:51 AM      Failed - This refill cannot be delegated      Failed - Urine Drug Screen completed in last 360 days      Passed - Valid encounter within last 6 months    Recent Outpatient Visits           5 months ago Annual physical exam   [facility] Glean Hess, MD   11 months ago Essential hypertension   [facility] Glean Hess, MD   1 year ago Annual physical exam   [facility] Glean Hess, MD   2 years ago Major depressive disorder with single episode, in partial remission [facility])   [facility] Glean Hess, MD   2 years ago Anxiety disorder, unspecified type   [facility]  Glean Hess, MD       Future Appointments             In 1 week Glean Hess, MD [facility], [place]   In 6 months Glean Hess, MD [facility]               losartan (COZAAR) 25 MG tablet [Pharmacy Med Name: LOSARTAN POTASSIUM 25 MG TAB] 90 tablet 1    Sig: TAKE 1 TABLET BY MOUTH EVERY DAY      Cardiovascular:  Angiotensin Receptor Blockers Passed - 10/25/2020 11:51 AM      Passed - Cr in normal range and within 180 days    Creatinine, Ser  Date Value Ref Range Status  05/04/2020 0.85 0.57 - 1.00 mg/dL Final          Passed - K in normal range and within 180 days    Potassium  Date Value Ref Range Status  05/04/2020 4.7 3.5 - 5.2 mmol/L Final          Passed - Patient is not pregnant      Passed - Last BP in normal range    BP Readings from Last 1 Encounters:  05/04/20 100/66          Passed - Valid encounter within last 6 months    Recent Outpatient Visits           5 months ago Annual physical exam   [facility], [facility]   11 months ago Essential hypertension   [facility] Glean [facility], MD   1 year ago Annual physical exam   [facility] Glean Hess, MD   2 years ago Major depressive disorder with single episode, in partial remission [facility])   [facility] Glean Hess, MD   2 years ago Anxiety disorder, unspecified type   [facility] Glean Hess, MD  Future Appointments             In 1 week Army Melia Jesse Sans, MD [facility], [place]   In 6 months Glean Hess, MD [facility], [facility]               atenolol (TENORMIN) 25 MG tablet [Pharmacy Med Name: ATENOLOL 25 MG TABLET] 90 tablet 1    Sig: TAKE 1 TABLET BY MOUTH EVERY DAY      Cardiovascular:  Beta Blockers Passed - 10/25/2020 11:51 AM      Passed - Last BP in normal range    BP Readings from Last 1 Encounters:  05/04/20 100/66           Passed - Last Heart Rate in normal range    Pulse Readings from Last 1 Encounters:  05/04/20 61          Passed - Valid encounter within last 6 months    Recent Outpatient Visits           5 months ago Annual physical exam   [facility] Glean Hess, MD   11 months ago Essential hypertension   [facility] Glean Hess, MD   1 year ago Annual physical exam   [facility] Glean Hess, MD   2 years ago Major depressive disorder with single episode, in partial remission [facility])   [facility] Glean Hess, MD   2 years ago Anxiety disorder, unspecified type   [facility] Glean Hess, MD       Future Appointments             In 1 week Army Melia Jesse Sans, MD [facility], [place]   In 6 months Army Melia, Jesse Sans, MD [facility], [facility]

## 2020-10-26 NOTE — Telephone Encounter (Signed)
Please review. Last office visit 05/04/2020.  KP

## 2020-11-01 ENCOUNTER — Encounter: Payer: Self-pay | Admitting: Internal Medicine

## 2020-11-01 ENCOUNTER — Ambulatory Visit (INDEPENDENT_AMBULATORY_CARE_PROVIDER_SITE_OTHER): Payer: Medicare PPO | Admitting: Internal Medicine

## 2020-11-01 ENCOUNTER — Other Ambulatory Visit: Payer: Self-pay

## 2020-11-01 VITALS — BP 106/68 | HR 52 | Temp 98.1°F | Ht 68.0 in | Wt 196.0 lb

## 2020-11-01 DIAGNOSIS — I10 Essential (primary) hypertension: Secondary | ICD-10-CM | POA: Diagnosis not present

## 2020-11-01 DIAGNOSIS — E782 Mixed hyperlipidemia: Secondary | ICD-10-CM

## 2020-11-01 DIAGNOSIS — G250 Essential tremor: Secondary | ICD-10-CM | POA: Diagnosis not present

## 2020-11-01 DIAGNOSIS — F324 Major depressive disorder, single episode, in partial remission: Secondary | ICD-10-CM

## 2020-11-01 DIAGNOSIS — F419 Anxiety disorder, unspecified: Secondary | ICD-10-CM

## 2020-11-01 MED ORDER — FLUOXETINE HCL 20 MG PO CAPS: 20.0000 mg | ORAL_CAPSULE | Freq: Every day | ORAL | 1 refills | Status: DC

## 2020-11-01 MED ORDER — LOSARTAN POTASSIUM 25 MG PO TABS: 25.0000 mg | ORAL_TABLET | Freq: Every day | ORAL | 1 refills | Status: DC

## 2020-11-01 MED ORDER — ATENOLOL 25 MG PO TABS
25.0000 mg | ORAL_TABLET | Freq: Every day | ORAL | 1 refills | Status: DC
Start: 2020-11-01 — End: 2021-05-08

## 2020-11-01 MED ORDER — SIMVASTATIN 20 MG PO TABS: 20.0000 mg | ORAL_TABLET | Freq: Every day | ORAL | 1 refills | Status: DC

## 2020-11-01 NOTE — Progress Notes (Signed)
Date:  11/01/2020   Name:  Diana Mcdaniel   DOB:  05/19/1950   MRN:  [id number]   Chief Complaint: Hypertension  Hypertension Pertinent negatives include no chest pain, headaches, palpitations or shortness of breath. Past treatments include angiotensin blockers and beta blockers. The current treatment provides significant improvement.  Depression        This is a chronic problem.The problem is unchanged.  Associated symptoms include no fatigue and no headaches.  Past treatments include SSRIs - Selective serotonin reuptake inhibitors.  Compliance with treatment is good.  Lab Results  Component Value Date   CREATININE 0.85 05/04/2020   BUN 11 05/04/2020   NA 136 05/04/2020   K 4.7 05/04/2020   CL 96 05/04/2020   CO2 22 05/04/2020   Lab Results  Component Value Date   CHOL 217 (H) 05/04/2020   HDL 77 05/04/2020   LDLCALC 120 (H) 05/04/2020   TRIG 115 05/04/2020   CHOLHDL 2.8 05/04/2020   Lab Results  Component Value Date   TSH 4.210 05/04/2020   Lab Results  Component Value Date   HGBA1C 5.5 05/04/2020   Lab Results  Component Value Date   WBC 7.0 05/04/2020   HGB 13.3 05/04/2020   HCT 39.1 05/04/2020   MCV 90 05/04/2020   PLT 291 05/04/2020   Lab Results  Component Value Date   ALT 19 05/04/2020   AST 20 05/04/2020   ALKPHOS 84 05/04/2020   BILITOT 0.7 05/04/2020     Review of Systems  Constitutional:  Positive for unexpected weight change (has lost 12 lbs - has cut back on wine intake). Negative for chills and fatigue.  HENT:  Negative for nosebleeds.   Eyes:  Negative for visual disturbance.  Respiratory:  Negative for cough, chest tightness, shortness of breath and wheezing.   Cardiovascular:  Negative for chest pain, palpitations and leg swelling.  Gastrointestinal:  Negative for abdominal pain, constipation and diarrhea.  Neurological:  Positive for tremors (unchanged). Negative for dizziness, weakness, light-headedness and headaches.   Psychiatric/Behavioral:  Positive for depression. Negative for dysphoric mood and sleep disturbance. The patient is not nervous/anxious.    Patient Active Problem List   Diagnosis Date Noted   Elevated TSH 11/02/2019   Primary insomnia 05/03/2019   Family history of colon cancer in father    Polyp of sigmoid colon    Osteopenia determined by x-ray 05/26/2018   Sarcoidosis of lung ([place]) 04/29/2018   Migraine headache with aura 04/29/2018   Tremor, essential 04/29/2018   Panic disorder 04/29/2018   Essential hypertension 12/01/2017   Mixed hyperlipidemia 12/01/2017   Ganglion cyst of foot 12/01/2017   Major depressive disorder with single episode, in partial remission ([place]) 12/01/2017   Primary osteoarthritis of left hip 12/01/2017    No Known Allergies  Past Surgical History:  Procedure Laterality Date   CARDIAC CATHETERIZATION  2010   Normal   COLONOSCOPY WITH PROPOFOL N/A 09/28/2018   Procedure: COLONOSCOPY WITH  biopsy;  Surgeon: Lucilla Lame, MD;  Location: [facility];  Service: Endoscopy;  Laterality: N/A;   POLYPECTOMY  09/28/2018   Procedure: POLYPECTOMY;  Surgeon: Lucilla Lame, MD;  [facility];  Service: Endoscopy;;    Social History   Tobacco Use   Smoking status: Former    Packs/day: 0.00    Years: 0.00    Pack years: 0.00    Types: Cigarettes    Quit date: 1995    Years since quitting: 27.6  Smokeless tobacco: Never  Vaping Use   Vaping Use: Never used  Substance Use Topics   Alcohol use: Yes    Alcohol/week: 24.0 standard drinks    Types: 24 Glasses of wine per week    Comment: 3-4 glasses wine/day   Drug use: Never     Medication list has been reviewed and updated.  Current Meds  Medication Sig   albuterol (VENTOLIN HFA) 108 (90 Base) MCG/ACT inhaler Inhale 2 puffs into the lungs every 6 (six) hours as needed for wheezing or shortness of breath.   atenolol (TENORMIN) 25 MG tablet TAKE 1 TABLET BY MOUTH EVERY DAY    Calcium-Magnesium-Vitamin D (CALCIUM 1200+D3 PO) Take by mouth daily.   clonazePAM (KLONOPIN) 0.5 MG tablet TAKE 1 TABLET BY MOUTH 2 TIMES DAILY AS NEEDED FOR ANXIETY.   FLUoxetine (PROZAC) 20 MG capsule TAKE 1 CAPSULE BY MOUTH EVERY DAY   losartan (COZAAR) 25 MG tablet TAKE 1 TABLET BY MOUTH EVERY DAY   mometasone (ELOCON) 0.1 % cream Apply 1 application topically daily as needed (Rash).   simvastatin (ZOCOR) 20 MG tablet TAKE 1 TABLET BY MOUTH EVERY DAY    PHQ 2/9 Scores 11/01/2020 05/04/2020 01/03/2020 11/02/2019  PHQ - 2 Score 0 0 0 2  PHQ- 9 Score 0 0 - 2    GAD 7 : Generalized Anxiety Score 11/01/2020 05/04/2020 11/02/2019 07/23/2018  Nervous, Anxious, on Edge 0 1 1 0  Control/stop worrying 0 1 1 0  Worry too much - different things 0 1 1 0  Trouble relaxing 0 0 0 0  Restless 0 0 0 0  Easily annoyed or irritable 0 0 0 0  Afraid - awful might happen 0 0 1 0  Total GAD 7 Score 0 3 4 0  Anxiety Difficulty Not difficult at all - Not difficult at all Not difficult at all    BP Readings from Last 3 Encounters:  11/01/20 106/68  05/04/20 100/66  11/02/19 118/72    Physical Exam Vitals and nursing note reviewed.  Constitutional:      General: She is not in acute distress.    Appearance: She is well-developed.  HENT:     Head: Normocephalic and atraumatic.  Cardiovascular:     Rate and Rhythm: Normal rate and regular rhythm.     Pulses: Normal pulses.     Heart sounds: No murmur heard. Pulmonary:     Effort: Pulmonary effort is normal. No respiratory distress.     Breath sounds: No wheezing or rhonchi.  Musculoskeletal:     Cervical back: Normal range of motion.     Right lower leg: No edema.     Left lower leg: No edema.  Lymphadenopathy:     Cervical: No cervical adenopathy.  Skin:    General: Skin is warm and dry.     Capillary Refill: Capillary refill takes less than 2 seconds.     Findings: No rash.  Neurological:     Mental Status: She is alert and oriented to  person, place, and time.     Motor: Tremor (fine tremor of the head and hands) present.  Psychiatric:        Mood and Affect: Mood normal.        Behavior: Behavior normal.    Wt Readings from Last 3 Encounters:  11/01/20 196 lb (88.9 kg)  05/04/20 208 lb (94.3 kg)  11/02/19 205 lb (93 kg)    BP 106/68   Pulse (!) 52  Temp 98.1 F (36.7 C) (Oral)   Ht '5\' 8"'$  (1.727 m)   Wt 196 lb (88.9 kg)   SpO2 94%   BMI 29.80 kg/m   Assessment and Plan: 1. Essential hypertension Clinically stable exam with well controlled BP. Tolerating medications without side effects at this time. Pt to continue current regimen and low sodium diet; benefits of regular exercise as able discussed. - atenolol (TENORMIN) 25 MG tablet; Take 1 tablet (25 mg total) by mouth daily.  Dispense: 90 tablet; Refill: 1 - losartan (COZAAR) 25 MG tablet; Take 1 tablet (25 mg total) by mouth daily.  Dispense: 90 tablet; Refill: 1  2. Major depressive disorder with single episode, in partial remission ([place]) Doing well on Prozac and clonazepam   3. Anxiety disorder, unspecified type Clinically stable on current regimen with good control of symptoms, No SI or HI. Will continue current therapy. - FLUoxetine (PROZAC) 20 MG capsule; Take 1 capsule (20 mg total) by mouth daily.  Dispense: 90 capsule; Refill: 1  4. Mixed hyperlipidemia - simvastatin (ZOCOR) 20 MG tablet; Take 1 tablet (20 mg total) by mouth daily.  Dispense: 90 tablet; Refill: 1  5. Tremor, essential Essential tremor unchanged; not interfering with ADLs. Not currently on any treatment.   Partially dictated using Editor, commissioning. Any errors are unintentional.  Halina Maidens, MD [facility]  11/01/2020

## 2021-01-03 ENCOUNTER — Ambulatory Visit (INDEPENDENT_AMBULATORY_CARE_PROVIDER_SITE_OTHER): Payer: Medicare PPO

## 2021-01-03 ENCOUNTER — Ambulatory Visit: Payer: Medicare PPO

## 2021-01-03 DIAGNOSIS — Z0000 Encounter for general adult medical examination without abnormal findings: Secondary | ICD-10-CM

## 2021-01-03 NOTE — Patient Instructions (Signed)
Diana Mcdaniel , Thank you for taking time to come for your Medicare Wellness Visit. I appreciate your ongoing commitment to your health goals. Please review the following plan we discussed and let me know if I can assist you in the future.   Screening recommendations/referrals: Colonoscopy: done 09/28/18. Repeat 09/2023 Mammogram: done 07/12/20 Bone Density: done 07/12/20 Recommended yearly ophthalmology/optometry visit for glaucoma screening and checkup Recommended yearly dental visit for hygiene and checkup  Vaccinations: Influenza vaccine: done 12/26/20 Pneumococcal vaccine: done 05/04/20 Tdap vaccine: due Shingles vaccine: Shingrix discussed. Please contact your pharmacy for coverage information.  Covid-19:done 04/30/19, 05/29/19, 01/07/20 & 06/23/20  Advanced directives: Please bring a copy of your health care power of attorney and living will to the office at your convenience.   Conditions/risks identified: Recommend increasing physical activity  Next appointment: Follow up in one year for your annual wellness visit    Preventive Care 65 Years and Older, Female Preventive care refers to lifestyle choices and visits with your health care provider that can promote health and wellness. What does preventive care include? A yearly physical exam. This is also called an annual well check. Dental exams once or twice a year. Routine eye exams. Ask your health care provider how often you should have your eyes checked. Personal lifestyle choices, including: Daily care of your teeth and gums. Regular physical activity. Eating a healthy diet. Avoiding tobacco and drug use. Limiting alcohol use. Practicing safe sex. Taking low-dose aspirin every day. Taking vitamin and mineral supplements as recommended by your health care provider. What happens during an annual well check? The services and screenings done by your health care provider during your annual well check will depend on your age,  overall health, lifestyle risk factors, and family history of disease. Counseling  Your health care provider may ask you questions about your: Alcohol use. Tobacco use. Drug use. Emotional well-being. Home and relationship well-being. Sexual activity. Eating habits. History of falls. Memory and ability to understand (cognition). Work and work Statistician. Reproductive health. Screening  You may have the following tests or measurements: Height, weight, and BMI. Blood pressure. Lipid and cholesterol levels. These may be checked every 5 years, or more frequently if you are over 72 years old. Skin check. Lung cancer screening. You may have this screening every year starting at age 72 if you have a 30-pack-year history of smoking and currently smoke or have quit within the past 15 years. Fecal occult blood test (FOBT) of the stool. You may have this test every year starting at age 72. Flexible sigmoidoscopy or colonoscopy. You may have a sigmoidoscopy every 5 years or a colonoscopy every 10 years starting at age 72. Hepatitis C blood test. Hepatitis B blood test. Sexually transmitted disease (STD) testing. Diabetes screening. This is done by checking your blood sugar (glucose) after you have not eaten for a while (fasting). You may have this done every 1-3 years. Bone density scan. This is done to screen for osteoporosis. You may have this done starting at age 72. Mammogram. This may be done every 1-2 years. Talk to your health care provider about how often you should have regular mammograms. Talk with your health care provider about your test results, treatment options, and if necessary, the need for more tests. Vaccines  Your health care provider may recommend certain vaccines, such as: Influenza vaccine. This is recommended every year. Tetanus, diphtheria, and acellular pertussis (Tdap, Td) vaccine. You may need a Td booster every 10 years. Zoster vaccine.  You may need this after age  72. Pneumococcal 13-valent conjugate (PCV13) vaccine. One dose is recommended after age 38. Pneumococcal polysaccharide (PPSV23) vaccine. One dose is recommended after age 72. Talk to your health care provider about which screenings and vaccines you need and how often you need them. This information is not intended to replace advice given to you by your health care provider. Make sure you discuss any questions you have with your health care provider. Document Released: 03/31/2015 Document Revised: 11/22/2015 Document Reviewed: 01/03/2015 Elsevier Interactive Patient Education  2017 [employer] [employer] Prevention in the Home Falls can cause injuries. They can happen to people of all ages. There are many things you can do to make your home safe and to help prevent falls. What can I do on the outside of my home? Regularly fix the edges of walkways and driveways and fix any cracks. Remove anything that might make you trip as you walk through a door, such as a raised step or threshold. Trim any bushes or trees on the path to your home. Use bright outdoor lighting. Clear any walking paths of anything that might make someone trip, such as rocks or tools. Regularly check to see if handrails are loose or broken. Make sure that both sides of any steps have handrails. Any raised decks and porches should have guardrails on the edges. Have any leaves, snow, or ice cleared regularly. Use sand or salt on walking paths during winter. Clean up any spills in your garage right away. This includes oil or grease spills. What can I do in the bathroom? Use night lights. Install grab bars by the toilet and in the tub and shower. Do not use towel bars as grab bars. Use non-skid mats or decals in the tub or shower. If you need to sit down in the shower, use a plastic, non-slip stool. Keep the floor dry. Clean up any water that spills on the floor as soon as it happens. Remove soap buildup in the tub or shower  regularly. Attach bath mats securely with double-sided non-slip rug tape. Do not have throw rugs and other things on the floor that can make you trip. What can I do in the bedroom? Use night lights. Make sure that you have a light by your bed that is easy to reach. Do not use any sheets or blankets that are too big for your bed. They should not hang down onto the floor. Have a firm chair that has side arms. You can use this for support while you get dressed. Do not have throw rugs and other things on the floor that can make you trip. What can I do in the kitchen? Clean up any spills right away. Avoid walking on wet floors. Keep items that you use a lot in easy-to-reach places. If you need to reach something above you, use a strong step stool that has a grab bar. Keep electrical cords out of the way. Do not use floor polish or wax that makes floors slippery. If you must use wax, use non-skid floor wax. Do not have throw rugs and other things on the floor that can make you trip. What can I do with my stairs? Do not leave any items on the stairs. Make sure that there are handrails on both sides of the stairs and use them. Fix handrails that are broken or loose. Make sure that handrails are as long as the stairways. Check any carpeting to make sure that it is  firmly attached to the stairs. Fix any carpet that is loose or worn. Avoid having throw rugs at the top or bottom of the stairs. If you do have throw rugs, attach them to the floor with carpet tape. Make sure that you have a light switch at the top of the stairs and the bottom of the stairs. If you do not have them, ask someone to add them for you. What else can I do to help prevent falls? Wear shoes that: Do not have high heels. Have rubber bottoms. Are comfortable and fit you well. Are closed at the toe. Do not wear sandals. If you use a stepladder: Make sure that it is fully opened. Do not climb a closed stepladder. Make sure that  both sides of the stepladder are locked into place. Ask someone to hold it for you, if possible. Clearly mark and make sure that you can see: Any grab bars or handrails. First and last steps. Where the edge of each step is. Use tools that help you move around (mobility aids) if they are needed. These include: Canes. Walkers. Scooters. Crutches. Turn on the lights when you go into a dark area. Replace any light bulbs as soon as they burn out. Set up your furniture so you have a clear path. Avoid moving your furniture around. If any of your floors are uneven, fix them. If there are any pets around you, be aware of where they are. Review your medicines with your doctor. Some medicines can make you feel dizzy. This can increase your chance of falling. Ask your doctor what other things that you can do to help prevent falls. This information is not intended to replace advice given to you by your health care provider. Make sure you discuss any questions you have with your health care provider. Document Released: 12/29/2008 Document Revised: 08/10/2015 Document Reviewed: 04/08/2014 Elsevier Interactive Patient Education  2017 [employer].

## 2021-01-03 NOTE — Progress Notes (Signed)
Subjective:   Diana Mcdaniel is a 70 y.o. female who presents for Medicare Annual (Subsequent) preventive examination.  Virtual Visit via Telephone Note  I connected with  Akima Slaugh on 01/03/21 at  3:20 PM EDT by telephone and verified that I am speaking with the correct person using two identifiers.  Location: Patient: home Provider: [facility] Persons participating in the virtual visit: [place]   I discussed the limitations, risks, security and privacy concerns of performing an evaluation and management service by telephone and the availability of in person appointments. The patient expressed understanding and agreed to proceed.  Interactive audio and video telecommunications were attempted between this nurse and patient, however failed, due to patient having technical difficulties OR patient did not have access to video capability.  We continued and completed visit with audio only.  Some vital signs may be absent or patient reported.   Clemetine Marker, LPN   Review of Systems     Cardiac Risk Factors include: advanced age (>102men, >77 women);dyslipidemia;hypertension     Objective:    There were no vitals filed for this visit. There is no height or weight on file to calculate BMI.  Advanced Directives 01/03/2021 01/03/2020 09/28/2018  Does Patient Have a Medical Advance Directive? Yes Yes Yes  Type of Paramedic of [place];Living will [place];Living will -  Does patient want to make changes to medical advance directive? - - No - Patient declined  Copy of [place] in Chart? No - copy requested No - copy requested -    Current Medications (verified) Outpatient Encounter Medications as of 01/03/2021  Medication Sig   albuterol (VENTOLIN HFA) 108 (90 Base) MCG/ACT inhaler Inhale 2 puffs into the lungs every 6 (six) hours as needed for wheezing or shortness of breath.   atenolol  (TENORMIN) 25 MG tablet Take 1 tablet (25 mg total) by mouth daily.   Calcium-Magnesium-Vitamin D (CALCIUM 1200+D3 PO) Take by mouth daily.   clonazePAM (KLONOPIN) 0.5 MG tablet TAKE 1 TABLET BY MOUTH 2 TIMES DAILY AS NEEDED FOR ANXIETY.   FLUoxetine (PROZAC) 20 MG capsule Take 1 capsule (20 mg total) by mouth daily.   losartan (COZAAR) 25 MG tablet Take 1 tablet (25 mg total) by mouth daily.   mometasone (ELOCON) 0.1 % cream Apply 1 application topically daily as needed (Rash).   simvastatin (ZOCOR) 20 MG tablet Take 1 tablet (20 mg total) by mouth daily.   No facility-administered encounter medications on file as of 01/03/2021.    Allergies (verified) Patient has no known allergies.   History: Past Medical History:  Diagnosis Date   Anxiety    Arthritis 2005   Hyperlipidemia    Hypertension    Major depressive disorder with single episode, in partial remission (HCC)    Migraine headache    1-2x/year   Sarcoidosis    Past Surgical History:  Procedure Laterality Date   CARDIAC CATHETERIZATION  2010   Normal   COLONOSCOPY WITH PROPOFOL N/A 09/28/2018   Procedure: COLONOSCOPY WITH  biopsy;  Surgeon: Lucilla Lame, MD;  Location: [facility];  Service: Endoscopy;  Laterality: N/A;   POLYPECTOMY  09/28/2018   Procedure: POLYPECTOMY;  Surgeon: Lucilla Lame, MD;  Location[facility];  Service: Endoscopy;;   Family History  Problem Relation Age of Onset   Colon cancer Father    Skin cancer Sister    Breast cancer Sister 79   Skin cancer Brother  Breast cancer Maternal Grandmother 70   Diabetes Neg Hx    Heart disease Neg Hx    Hypertension Neg Hx    Social History   Socioeconomic History   Marital status: Married    Spouse name: Not on file   Number of children: 1   Years of education: Not on file   Highest education level: Not on file  Occupational History   Not on file  Tobacco Use   Smoking status: Former    Packs/day: 1.00    Years: 15.00     Pack years: 15.00    Types: Cigarettes    Quit date: 03/18/1993    Years since quitting: 27.8   Smokeless tobacco: Never  Vaping Use   Vaping Use: Never used  Substance and Sexual Activity   Alcohol use: Yes    Alcohol/week: 24.0 standard drinks    Types: 24 Glasses of wine per week    Comment: 3-4 glasses wine/day   Drug use: Never   Sexual activity: Yes    Birth control/protection: Post-menopausal  Other Topics Concern   Not on file  Social History Narrative   Not on file   Social Determinants of Health   Financial Resource Strain: Low Risk    Difficulty of Paying Living Expenses: Not hard at all  Food Insecurity: No Food Insecurity   Worried About Charity fundraiser in the Last Year: Never true   [place] in the Last Year: Never true  Transportation Needs: No Transportation Needs   Lack of Transportation (Medical): No   Lack of Transportation (Non-Medical): No  Physical Activity: Inactive   Days of Exercise per Week: 0 days   Minutes of Exercise per Session: 0 min  Stress: No Stress Concern Present   Feeling of Stress : Only a little  Social Connections: Moderately Isolated   Frequency of Communication with Friends and Family: More than three times a week   Frequency of Social Gatherings with Friends and Family: More than three times a week   Attends Religious Services: Never   Marine scientist or Organizations: No   Attends Music therapist: Never   Marital Status: Married    Tobacco Counseling Counseling given: Not Answered   Clinical Intake:  Pre-visit preparation completed: Yes  Pain : No/denies pain     Nutritional Risks: None Diabetes: No  How often do you need to have someone help you when you read instructions, pamphlets, or other written materials from your doctor or pharmacy?: 1 - Never    Interpreter Needed?: No  Information entered by :: Clemetine Marker LPN   Activities of Daily Living In your present state of  health, do you have any difficulty performing the following activities: 01/03/2021 11/01/2020  Hearing? N N  Vision? N N  Difficulty concentrating or making decisions? N N  Walking or climbing stairs? N Y  Dressing or bathing? N N  Doing errands, shopping? N N  Preparing Food and eating ? N -  Using the Toilet? N -  In the past six months, have you accidently leaked urine? Y -  Comment wears pads for protection -  Do you have problems with loss of bowel control? N -  Managing your Medications? N -  Managing your Finances? N -  Housekeeping or managing your Housekeeping? N -  Some recent data might be hidden    Patient Care Team: Glean Hess, MD as PCP - General (Internal Medicine)  Ralene Bathe, MD (Dermatology) Samara Deist, DPM as Referring Physician (Podiatry)  Indicate any recent Medical Services you may have received from other than [facility] providers in the past year (date may be approximate).     Assessment:   This is a routine wellness examination for Sheree.  Hearing/Vision screen Hearing Screening - Comments:: Pt denies hearing difficulty  Vision Screening - Comments:: Annual vision screenings done at [facility]  Dietary issues and exercise activities discussed: Current Exercise Habits: The patient does not participate in regular exercise at present, Exercise limited by: None identified   Goals Addressed             This Visit's Progress    Increase physical activity       Recommend physical activity for at least 30 minutes 3 times per day        Depression Screen PHQ 2/9 Scores 01/03/2021 11/01/2020 05/04/2020 01/03/2020 11/02/2019 05/03/2019 07/23/2018  PHQ - 2 Score 0 0 0 0 2 0 2  PHQ- 9 Score - 0 0 - 2 0 2    Fall Risk Fall Risk  01/03/2021 11/01/2020 05/04/2020 01/03/2020 11/02/2019  Falls in the past year? 0 0 0 0 0  Number falls in past yr: 0 0 - 0 -  Injury with Fall? 0 0 - 0 -  Risk for fall due to : No Fall Risks No Fall Risks No  Fall Risks No Fall Risks No Fall Risks  Follow up Falls prevention discussed Falls evaluation completed - Falls prevention discussed Falls evaluation completed    FALL RISK PREVENTION PERTAINING TO THE HOME:  Any stairs in or around the home? Yes  If so, are there any without handrails? No  Home free of loose throw rugs in walkways, pet beds, electrical cords, etc? Yes  Adequate lighting in your home to reduce risk of falls? Yes   ASSISTIVE DEVICES UTILIZED TO PREVENT FALLS:  Life alert? No  Use of a cane, walker or w/c? No  Grab bars in the bathroom? No  Shower chair or bench in shower? Yes  Elevated toilet seat or a handicapped toilet? No   TIMED UP AND GO:  Was the test performed? No . Telephonic visit.   Cognitive Function: Normal cognitive status assessed by direct observation by this Nurse Health Advisor. No abnormalities found.       6CIT Screen 01/03/2020  What Year? 0 points  What month? 0 points  What time? 0 points  Count back from 20 0 points  Months in reverse 0 points  Repeat phrase 0 points  Total Score 0    Immunizations Immunization History  Administered Date(s) Administered   Fluad Quad(high Dose 65+) 11/25/2018, 12/03/2019   Influenza, High Dose Seasonal PF 01/07/2018, 12/26/2020   Moderna Sars-Covid-2 Vaccination 04/30/2019, 05/29/2019, 01/07/2020, 06/23/2020   Pneumococcal Polysaccharide-23 05/04/2020    TDAP status: Due, Education has been provided regarding the importance of this vaccine. Advised may receive this vaccine at local pharmacy or Health Dept. Aware to provide a copy of the vaccination record if obtained from local pharmacy or Health Dept. Verbalized acceptance and understanding.  Flu Vaccine status: Up to date  Pneumococcal vaccine status: Up to date  Covid-19 vaccine status: Completed vaccines  Qualifies for Shingles Vaccine? Yes   Zostavax completed No   Shingrix Completed?: No.    Education has been provided regarding the  importance of this vaccine. Patient has been advised to call insurance company to determine out of pocket expense  if they have not yet received this vaccine. Advised may also receive vaccine at local pharmacy or Health Dept. Verbalized acceptance and understanding.  Screening Tests Health Maintenance  Topic Date Due   TETANUS/TDAP  Never done   Zoster Vaccines- Shingrix (1 of 2) Never done   COVID-19 Vaccine (5 - Booster for Moderna series) 10/23/2020   Pneumonia Vaccine 81+ Years old (2 - PCV) 05/04/2021   MAMMOGRAM  07/12/2021   COLONOSCOPY (Pts 45-48yrs Insurance coverage will need to be confirmed)  09/28/2023   INFLUENZA VACCINE  Completed   DEXA SCAN  Completed   Hepatitis C Screening  Completed   HPV VACCINES  Aged Out    Health Maintenance  Health Maintenance Due  Topic Date Due   TETANUS/TDAP  Never done   Zoster Vaccines- Shingrix (1 of 2) Never done   COVID-19 Vaccine (5 - Booster for Moderna series) 10/23/2020    Colorectal cancer screening: Type of screening: Colonoscopy. Completed 09/28/18. Repeat every 5 years  Mammogram status: Completed 07/12/20. Repeat every year  Bone Density status: Completed 07/12/20. Results reflect: Bone density results: NORMAL. Repeat every 2 years.  Lung Cancer Screening: (Low Dose CT Chest recommended if Age 53-80 years, 30 pack-year currently smoking OR have quit w/in 15years.) does not qualify  Additional Screening:  Hepatitis C Screening: does qualify; Completed 04/29/18  Vision Screening: Recommended annual ophthalmology exams for early detection of glaucoma and other disorders of the eye. Is the patient up to date with their annual eye exam?  Yes  Who is the provider or what is the name of the office in which the patient attends annual eye exams? [facility].   Dental Screening: Recommended annual dental exams for proper oral hygiene  Community Resource Referral / Chronic Care Management: CRR required this visit?  No    CCM required this visit?  No      Plan:     I have personally reviewed and noted the following in the patient's chart:   Medical and social history Use of alcohol, tobacco or illicit drugs  Current medications and supplements including opioid prescriptions.  Functional ability and status Nutritional status Physical activity Advanced directives List of other physicians Hospitalizations, surgeries, and ER visits in previous 12 months Vitals Screenings to include cognitive, depression, and falls Referrals and appointments  In addition, I have reviewed and discussed with patient certain preventive protocols, quality metrics, and best practice recommendations. A written personalized care plan for preventive services as well as general preventive health recommendations were provided to patient.     Clemetine Marker, LPN   [id number]   Nurse Notes: none

## 2021-01-15 DIAGNOSIS — H40003 Preglaucoma, unspecified, bilateral: Secondary | ICD-10-CM | POA: Diagnosis not present

## 2021-02-01 ENCOUNTER — Other Ambulatory Visit: Payer: Self-pay

## 2021-02-01 ENCOUNTER — Ambulatory Visit: Payer: Medicare PPO | Admitting: Dermatology

## 2021-02-01 DIAGNOSIS — L601 Onycholysis: Secondary | ICD-10-CM | POA: Diagnosis not present

## 2021-02-01 DIAGNOSIS — Z1283 Encounter for screening for malignant neoplasm of skin: Secondary | ICD-10-CM | POA: Diagnosis not present

## 2021-02-01 DIAGNOSIS — D229 Melanocytic nevi, unspecified: Secondary | ICD-10-CM | POA: Diagnosis not present

## 2021-02-01 DIAGNOSIS — L814 Other melanin hyperpigmentation: Secondary | ICD-10-CM

## 2021-02-01 DIAGNOSIS — L821 Other seborrheic keratosis: Secondary | ICD-10-CM | POA: Diagnosis not present

## 2021-02-01 DIAGNOSIS — L578 Other skin changes due to chronic exposure to nonionizing radiation: Secondary | ICD-10-CM

## 2021-02-01 DIAGNOSIS — L719 Rosacea, unspecified: Secondary | ICD-10-CM

## 2021-02-01 DIAGNOSIS — D1800 Hemangioma unspecified site: Secondary | ICD-10-CM

## 2021-02-01 MED ORDER — MOMETASONE FUROATE 0.1 % EX SOLN: Freq: Every day | CUTANEOUS | 6 refills | Status: DC

## 2021-02-01 NOTE — Patient Instructions (Signed)
Topical steroids (such as triamcinolone, fluocinolone, fluocinonide, mometasone, clobetasol, halobetasol, betamethasone, hydrocortisone) can cause thinning and lightening of the skin if they are used for too long in the same area. Your physician has selected the right strength medicine for your problem and area affected on the body. Please use your medication only as directed by your physician to prevent side effects.   Melanoma ABCDEs  Melanoma is the most dangerous type of skin cancer, and is the leading cause of death from skin disease.  You are more likely to develop melanoma if you: Have light-colored skin, light-colored eyes, or red or blond hair Spend a lot of time in the sun Tan regularly, either outdoors or in a tanning bed Have had blistering sunburns, especially during childhood Have a close family member who has had a melanoma Have atypical moles or large birthmarks  Early detection of melanoma is key since treatment is typically straightforward and cure rates are extremely high if we catch it early.   The first sign of melanoma is often a change in a mole or a new dark spot.  The ABCDE system is a way of remembering the signs of melanoma.  A for asymmetry:  The two halves do not match. B for border:  The edges of the growth are irregular. C for color:  A mixture of colors are present instead of an even brown color. D for diameter:  Melanomas are usually (but not always) greater than 17mm - the size of a pencil eraser. E for evolution:  The spot keeps changing in size, shape, and color.  Please check your skin once per month between visits. You can use a small mirror in front and a large mirror behind you to keep an eye on the back side or your body.   If you see any new or changing lesions before your next follow-up, please call to schedule a visit.  Please continue daily skin protection including broad spectrum sunscreen SPF 30+ to sun-exposed areas, reapplying every 2 hours as  needed when you're outdoors.   Staying in the shade or wearing long sleeves, sun glasses (UVA+UVB protection) and wide brim hats (4-inch brim around the entire circumference of the hat) are also recommended for sun protection.    If You Need Anything After Your Visit  If you have any questions or concerns for your doctor, please call our main line at [phone] and press option 4 to reach your doctor's medical assistant. If no one answers, please leave a voicemail as directed and we will return your call as soon as possible. Messages left after 4 pm will be answered the following business day.   You may also send [place] a message via [place]. We typically respond to MyChart messages within 1-2 business days.  For prescription refills, please ask your pharmacy to contact our office. Our fax number is [phone].  If you have an urgent issue when the clinic is closed that cannot wait until the next business day, you can page your doctor at the number below.    Please note that while we do our best to be available for urgent issues outside of office hours, we are not available 24/7.   If you have an urgent issue and are unable to reach [place], you may choose to seek medical care at your doctor's office, retail clinic, urgent care center, or emergency room.  If you have a medical emergency, please immediately call 911 or go to the emergency department.  Pager  Numbers  - Dr. Nehemiah Massed: [phone]  - Dr. Laurence Ferrari: [phone]  - Dr. Nicole Kindred: [phone]  In the event of inclement weather, please call our main line at [phone] for an update on the status of any delays or closures.  Dermatology Medication Tips: Please keep the boxes that topical medications come in in order to help keep track of the instructions about where and how to use these. Pharmacies typically print the medication instructions only on the boxes and not directly on the medication tubes.   If your medication is too  expensive, please contact our office at [phone] option 4 or send [place] a message through [place].   We are unable to tell what your co-pay for medications will be in advance as this is different depending on your insurance coverage. However, we may be able to find a substitute medication at lower cost or fill out paperwork to get insurance to cover a needed medication.   If a prior authorization is required to get your medication covered by your insurance company, please allow [place] 1-2 business days to complete this process.  Drug prices often vary depending on where the prescription is filled and some pharmacies may offer cheaper prices.  The website www.goodrx.com contains coupons for medications through different pharmacies. The prices here do not account for what the cost may be with help from insurance (it may be cheaper with your insurance), but the website can give you the price if you did not use any insurance.  - You can print the associated coupon and take it with your prescription to the pharmacy.  - You may also stop by our office during regular business hours and pick up a GoodRx coupon card.  - If you need your prescription sent electronically to a different pharmacy, notify our office through [facility] or by phone at [phone] option 4.     Si Usted Necesita Algo Despus de Su Visita  Tambin puede enviarnos un mensaje a travs de Pharmacist, community. Por lo general respondemos a los mensajes de MyChart en el transcurso de 1 a 2 das hbiles.  Para renovar recetas, por favor pida a su farmacia que se ponga en contacto con nuestra oficina. Harland Dingwall de fax es [place] [phone].  Si tiene un asunto urgente cuando la clnica est cerrada y que no puede esperar hasta el siguiente da hbil, puede llamar/localizar a su doctor(a) al nmero que aparece a continuacin.   Por favor, tenga en cuenta que aunque hacemos todo lo posible para estar disponibles para asuntos urgentes fuera  del horario de [place], no estamos disponibles las 24 horas del da, los 7 das de la [place].   Si tiene un problema urgente y no puede comunicarse con nosotros, puede optar por buscar atencin mdica  en el consultorio de su doctor(a), en una clnica privada, en un centro de atencin urgente o en una sala de emergencias.  Si tiene Engineering geologist, por favor llame inmediatamente al 911 o vaya a la sala de emergencias.  Nmeros de bper  - Dr. Nehemiah Massed: [phone]  - Dra. Moye: [phone]  - Dra. Nicole Kindred: [phone]  En caso de inclemencias del [place], por favor llame a Johnsie Kindred principal al [phone] para una actualizacin sobre el [place] de cualquier retraso o cierre.  Consejos para la medicacin en dermatologa: Por favor, guarde las cajas en las que vienen los medicamentos de uso tpico para ayudarle a seguir las instrucciones sobre dnde y cmo usarlos. [place]  instrucciones del medicamento slo en las cajas y no directamente en los tubos del medicamento.   Si su medicamento es muy caro, por favor, pngase en contacto con Zigmund Daniel llamando al [phone] y presione la opcin 4 o envenos un mensaje a travs de Pharmacist, community.   No podemos decirle cul ser su copago por los medicamentos por adelantado ya que esto es diferente dependiendo de la cobertura de su seguro. Sin embargo, es posible que podamos encontrar un medicamento sustituto a Electrical engineer un formulario para que el seguro cubra el medicamento que se considera necesario.   Si se requiere una autorizacin previa para que su compaa de seguros [place] su medicamento, por favor permtanos de 1 a 2 das hbiles para completar este proceso.  Los precios de los medicamentos varan con frecuencia dependiendo del Environmental consultant de dnde se surte la receta y alguna farmacias pueden ofrecer precios ms baratos.  El sitio web www.goodrx.com tiene cupones para medicamentos de Office manager. Los precios aqu no tienen en cuenta lo que podra costar con la ayuda del seguro (puede ser ms barato con su seguro), pero el sitio web puede darle el precio si no utiliz Research scientist (physical sciences).  - Puede imprimir el cupn correspondiente y llevarlo con su receta a la farmacia.  - Tambin puede pasar por nuestra oficina durante el horario de atencin regular y Charity fundraiser una tarjeta de cupones de GoodRx.  - Si necesita que su receta se enve electrnicamente a una farmacia diferente, informe a nuestra oficina a travs de MyChart de [place] o por telfono llamando al [phone] y presione la opcin 4.

## 2021-02-01 NOTE — Progress Notes (Signed)
Follow-Up Visit   Subjective  Diana Mcdaniel is a 70 y.o. female who presents for the following: Annual Exam (Patient here today for 1 year tbse. Patient reports red spots and rough patch on top of head she would like checked. ). The patient presents for Total-Body Skin Exam (TBSE) for skin cancer screening and mole check.  The patient has spots, moles and lesions to be evaluated, some may be new or changing and the patient has concerns that these could be cancer.  The following portions of the chart were reviewed this encounter and updated as appropriate:  Tobacco  Allergies  Meds  Problems  Med Hx  Surg Hx  Fam Hx     Review of Systems: No other skin or systemic complaints except as noted in HPI or Assessment and Plan.  Objective  Well appearing patient in no apparent distress; mood and affect are within normal limits.  A full examination was performed including scalp, head, eyes, ears, nose, lips, neck, chest, axillae, abdomen, back, buttocks, bilateral upper extremities, bilateral lower extremities, hands, feet, fingers, toes, fingernails, and toenails. All findings within normal limits unless otherwise noted below.  Right 4th Finger Nail Plate Distal nail onycholysis   Head - Anterior (Face) Mid face erythema with telangiectasias  Assessment & Plan  Onycholysis Psoriasis versus trauma Right 4th Finger Nail Plate  Chronic and persistent   Start Mometasone 0.1 % lotion apply 1 drop to affected nails 5 times weekly.    Topical steroids (such as triamcinolone, fluocinolone, fluocinonide, mometasone, clobetasol, halobetasol, betamethasone, hydrocortisone) can cause thinning and lightening of the skin if they are used for too long in the same area. Your physician has selected the right strength medicine for your problem and area affected on the body. Please use your medication only as directed by your physician to prevent side effects.  mometasone (ELOCON) 0.1 % lotion -  Right 4th Finger Nail Plate Apply topically daily. Apply 1 drop daily 5 days weekly to affected nails  Rosacea Head - Anterior (Face) Rosacea is a chronic progressive skin condition usually affecting the face of adults, causing redness and/or acne bumps. It is treatable but not curable. It sometimes affects the eyes (ocular rosacea) as well. It may respond to topical and/or systemic medication and can flare with stress, sun exposure, alcohol, exercise and some foods.  Daily application of broad spectrum spf 30+ sunscreen to face is recommended to reduce flares.  Discussed bbl / 3 treatment and $350 Patient deferred treatment   Skin cancer screening  Lentigines - Scattered tan macules - Due to sun exposure - Benign-appearing, observe - Recommend daily broad spectrum sunscreen SPF 30+ to sun-exposed areas, reapply every 2 hours as needed. - Call for any changes  Seborrheic Keratoses - Stuck-on, waxy, tan-brown papules and/or plaques  - Benign-appearing - Discussed benign etiology and prognosis. - Observe - Call for any changes  Melanocytic Nevi - Tan-brown and/or pink-flesh-colored symmetric macules and papules - Benign appearing on exam today - Observation - Call clinic for new or changing moles - Recommend daily use of broad spectrum spf 30+ sunscreen to sun-exposed areas.   Hemangiomas - Red papules - Discussed benign nature - Observe - Call for any changes  Actinic Damage - Chronic condition, secondary to cumulative UV/sun exposure - diffuse scaly erythematous macules with underlying dyspigmentation - Recommend daily broad spectrum sunscreen SPF 30+ to sun-exposed areas, reapply every 2 hours as needed.  - Staying in the shade or wearing long sleeves,  sun glasses (UVA+UVB protection) and wide brim hats (4-inch brim around the entire circumference of the hat) are also recommended for sun protection.  - Call for new or changing lesions.  Skin cancer screening performed  today.  Return for 1 year tbse . IRuthell Rummage, CMA, am acting as scribe for Sarina Ser, MD. Documentation: I have reviewed the above documentation for accuracy and completeness, and I agree with the above.  Sarina Ser, MD

## 2021-02-16 ENCOUNTER — Encounter: Payer: Self-pay | Admitting: Dermatology

## 2021-02-19 ENCOUNTER — Encounter: Payer: Self-pay | Admitting: Internal Medicine

## 2021-02-19 ENCOUNTER — Other Ambulatory Visit: Payer: Self-pay

## 2021-02-19 ENCOUNTER — Ambulatory Visit: Payer: Medicare PPO | Admitting: Internal Medicine

## 2021-02-19 VITALS — BP 106/78 | HR 64 | Temp 98.1°F | Ht 68.0 in | Wt 199.0 lb

## 2021-02-19 DIAGNOSIS — J040 Acute laryngitis: Secondary | ICD-10-CM | POA: Diagnosis not present

## 2021-02-19 MED ORDER — AZITHROMYCIN 250 MG PO TABS: ORAL_TABLET | ORAL | 0 refills | Status: AC

## 2021-02-19 NOTE — Progress Notes (Signed)
Date:  02/19/2021   Name:  Diana Mcdaniel   DOB:  1951/01/09   MRN:  [id number]   Chief Complaint: Cough (Neg covid test x2 )  Cough This is a new problem. Episode onset: X3 days. The problem has been unchanged. The problem occurs hourly. The cough is Productive of sputum. Associated symptoms include ear pain, nasal congestion, postnasal drip, shortness of breath (stable) and wheezing (no more than usual). Pertinent negatives include no chest pain, chills, fever, headaches or sore throat. Associated symptoms comments: hoarse. She has tried OTC cough suppressant and a beta-agonist inhaler for the symptoms. The treatment provided mild relief.   Lab Results  Component Value Date   NA 136 05/04/2020   K 4.7 05/04/2020   CO2 22 05/04/2020   GLUCOSE 107 (H) 05/04/2020   BUN 11 05/04/2020   CREATININE 0.85 05/04/2020   CALCIUM 9.8 05/04/2020   GFRNONAA 69 05/04/2020   Lab Results  Component Value Date   CHOL 217 (H) 05/04/2020   HDL 77 05/04/2020   LDLCALC 120 (H) 05/04/2020   TRIG 115 05/04/2020   CHOLHDL 2.8 05/04/2020   Lab Results  Component Value Date   TSH 4.210 05/04/2020   Lab Results  Component Value Date   HGBA1C 5.5 05/04/2020   Lab Results  Component Value Date   WBC 7.0 05/04/2020   HGB 13.3 05/04/2020   HCT 39.1 05/04/2020   MCV 90 05/04/2020   PLT 291 05/04/2020   Lab Results  Component Value Date   ALT 19 05/04/2020   AST 20 05/04/2020   ALKPHOS 84 05/04/2020   BILITOT 0.7 05/04/2020   Lab Results  Component Value Date   VD25OH 41.8 05/04/2020     Review of Systems  Constitutional:  Negative for chills, fatigue and fever.  HENT:  Positive for ear pain, postnasal drip and voice change. Negative for sore throat.   Respiratory:  Positive for cough, shortness of breath (stable) and wheezing (no more than usual).   Cardiovascular:  Negative for chest pain.  Gastrointestinal:  Negative for abdominal pain, nausea and vomiting.  Neurological:   Negative for dizziness, light-headedness and headaches.   Patient Active Problem List   Diagnosis Date Noted   Elevated TSH 11/02/2019   Primary insomnia 05/03/2019   Family history of colon cancer in father    Polyp of sigmoid colon    Osteopenia determined by x-ray 05/26/2018   Sarcoidosis of lung ([place]) 04/29/2018   Migraine headache with aura 04/29/2018   Tremor, essential 04/29/2018   Panic disorder 04/29/2018   Essential hypertension 12/01/2017   Mixed hyperlipidemia 12/01/2017   Ganglion cyst of foot 12/01/2017   Major depressive disorder with single episode, in partial remission ([place]) 12/01/2017   Primary osteoarthritis of left hip 12/01/2017    No Known Allergies  Past Surgical History:  Procedure Laterality Date   CARDIAC CATHETERIZATION  2010   Normal   COLONOSCOPY WITH PROPOFOL N/A 09/28/2018   Procedure: COLONOSCOPY WITH  biopsy;  Surgeon: Lucilla Lame, MD;  Location: [place];  Service: Endoscopy;  Laterality: N/A;   POLYPECTOMY  09/28/2018   Procedure: POLYPECTOMY;  Surgeon: Lucilla Lame, MD;  [facility];  Service: Endoscopy;;    Social History   Tobacco Use   Smoking status: Former    Packs/day: 1.00    Years: 15.00    Pack years: 15.00    Types: Cigarettes    Quit date: 03/18/1993    Years since  quitting: 27.9   Smokeless tobacco: Never  Vaping Use   Vaping Use: Never used  Substance Use Topics   Alcohol use: Yes    Alcohol/week: 24.0 standard drinks    Types: 24 Glasses of wine per week    Comment: 3-4 glasses wine/day   Drug use: Never     Medication list has been reviewed and updated.  Current Meds  Medication Sig   albuterol (VENTOLIN HFA) 108 (90 Base) MCG/ACT inhaler Inhale 2 puffs into the lungs every 6 (six) hours as needed for wheezing or shortness of breath.   atenolol (TENORMIN) 25 MG tablet Take 1 tablet (25 mg total) by mouth daily.   Calcium-Magnesium-Vitamin D (CALCIUM 1200+D3 PO) Take by mouth daily.    clonazePAM (KLONOPIN) 0.5 MG tablet TAKE 1 TABLET BY MOUTH 2 TIMES DAILY AS NEEDED FOR ANXIETY.   FLUoxetine (PROZAC) 20 MG capsule Take 1 capsule (20 mg total) by mouth daily.   losartan (COZAAR) 25 MG tablet Take 1 tablet (25 mg total) by mouth daily.   mometasone (ELOCON) 0.1 % cream Apply 1 application topically daily as needed (Rash).   mometasone (ELOCON) 0.1 % lotion Apply topically daily. Apply 1 drop daily 5 days weekly to affected nails   simvastatin (ZOCOR) 20 MG tablet Take 1 tablet (20 mg total) by mouth daily.    PHQ 2/9 Scores 02/19/2021 01/03/2021 11/01/2020 05/04/2020  PHQ - 2 Score 0 0 0 0  PHQ- 9 Score 0 - 0 0    GAD 7 : Generalized Anxiety Score 02/19/2021 11/01/2020 05/04/2020 11/02/2019  Nervous, Anxious, on Edge 0 0 1 1  Control/stop worrying 0 0 1 1  Worry too much - different things 0 0 1 1  Trouble relaxing 0 0 0 0  Restless 0 0 0 0  Easily annoyed or irritable 0 0 0 0  Afraid - awful might happen 0 0 0 1  Total GAD 7 Score 0 0 3 4  Anxiety Difficulty Not difficult at all Not difficult at all - Not difficult at all    BP Readings from Last 3 Encounters:  02/19/21 106/78  11/01/20 106/68  05/04/20 100/66    Physical Exam Vitals and nursing note reviewed.  Constitutional:      General: She is not in acute distress.    Appearance: She is well-developed.  HENT:     Head: Normocephalic and atraumatic.     Right Ear: Tympanic membrane and ear canal normal.     Left Ear: Tympanic membrane and ear canal normal.     Nose:     Right Sinus: No maxillary sinus tenderness or frontal sinus tenderness.     Left Sinus: No maxillary sinus tenderness or frontal sinus tenderness.     Mouth/Throat:     Mouth: Mucous membranes are moist.     Pharynx: Uvula midline. Posterior oropharyngeal erythema present. No oropharyngeal exudate.     Comments: Voice extremely hoarse Cardiovascular:     Rate and Rhythm: Normal rate and regular rhythm.     Pulses: Normal pulses.   Pulmonary:     Effort: Pulmonary effort is normal. No respiratory distress.     Breath sounds: Transmitted upper airway sounds present. No wheezing or rhonchi.  Musculoskeletal:     Cervical back: Normal range of motion.  Lymphadenopathy:     Cervical: No cervical adenopathy.  Skin:    General: Skin is warm and dry.     Findings: No rash.  Neurological:  Mental Status: She is alert and oriented to person, place, and time.  Psychiatric:        Mood and Affect: Mood normal.        Behavior: Behavior normal.    Wt Readings from Last 3 Encounters:  02/19/21 199 lb (90.3 kg)  11/01/20 196 lb (88.9 kg)  05/04/20 208 lb (94.3 kg)    BP 106/78   Pulse 64   Temp 98.1 F (36.7 C) (Oral)   Ht 5\' 8"  (1.727 m)   Wt 199 lb (90.3 kg)   SpO2 97%   BMI 30.26 kg/m   Assessment and Plan: 1. Laryngitis, acute Likely started as a viral infection but because of her hx, will cover for bacterial infection Continue over the counter cough syrup; warm liquids, voice rest Add Claritin or Allegra daily for 7-10 days  - azithromycin (ZITHROMAX Z-PAK) 250 MG tablet; UAD  Dispense: 6 each; Refill: 0   Partially dictated using Editor, commissioning. Any errors are unintentional.  Halina Maidens, MD [facility]  02/19/2021

## 2021-02-19 NOTE — Patient Instructions (Signed)
Loratadine 10 mg  or Fexofenadine 180 mg  take once a day.  Take any over the counter cough syrup - Delsym is good, Nyquil, etc

## 2021-02-26 IMAGING — CR DG CHEST 2V
2 series · 2 of 2 positions shown · non-contrast
Comparison: None.

CLINICAL DATA: Sarcoidosis.  Wheezing

EXAM:
CHEST - 2 VIEW

[chest pa]
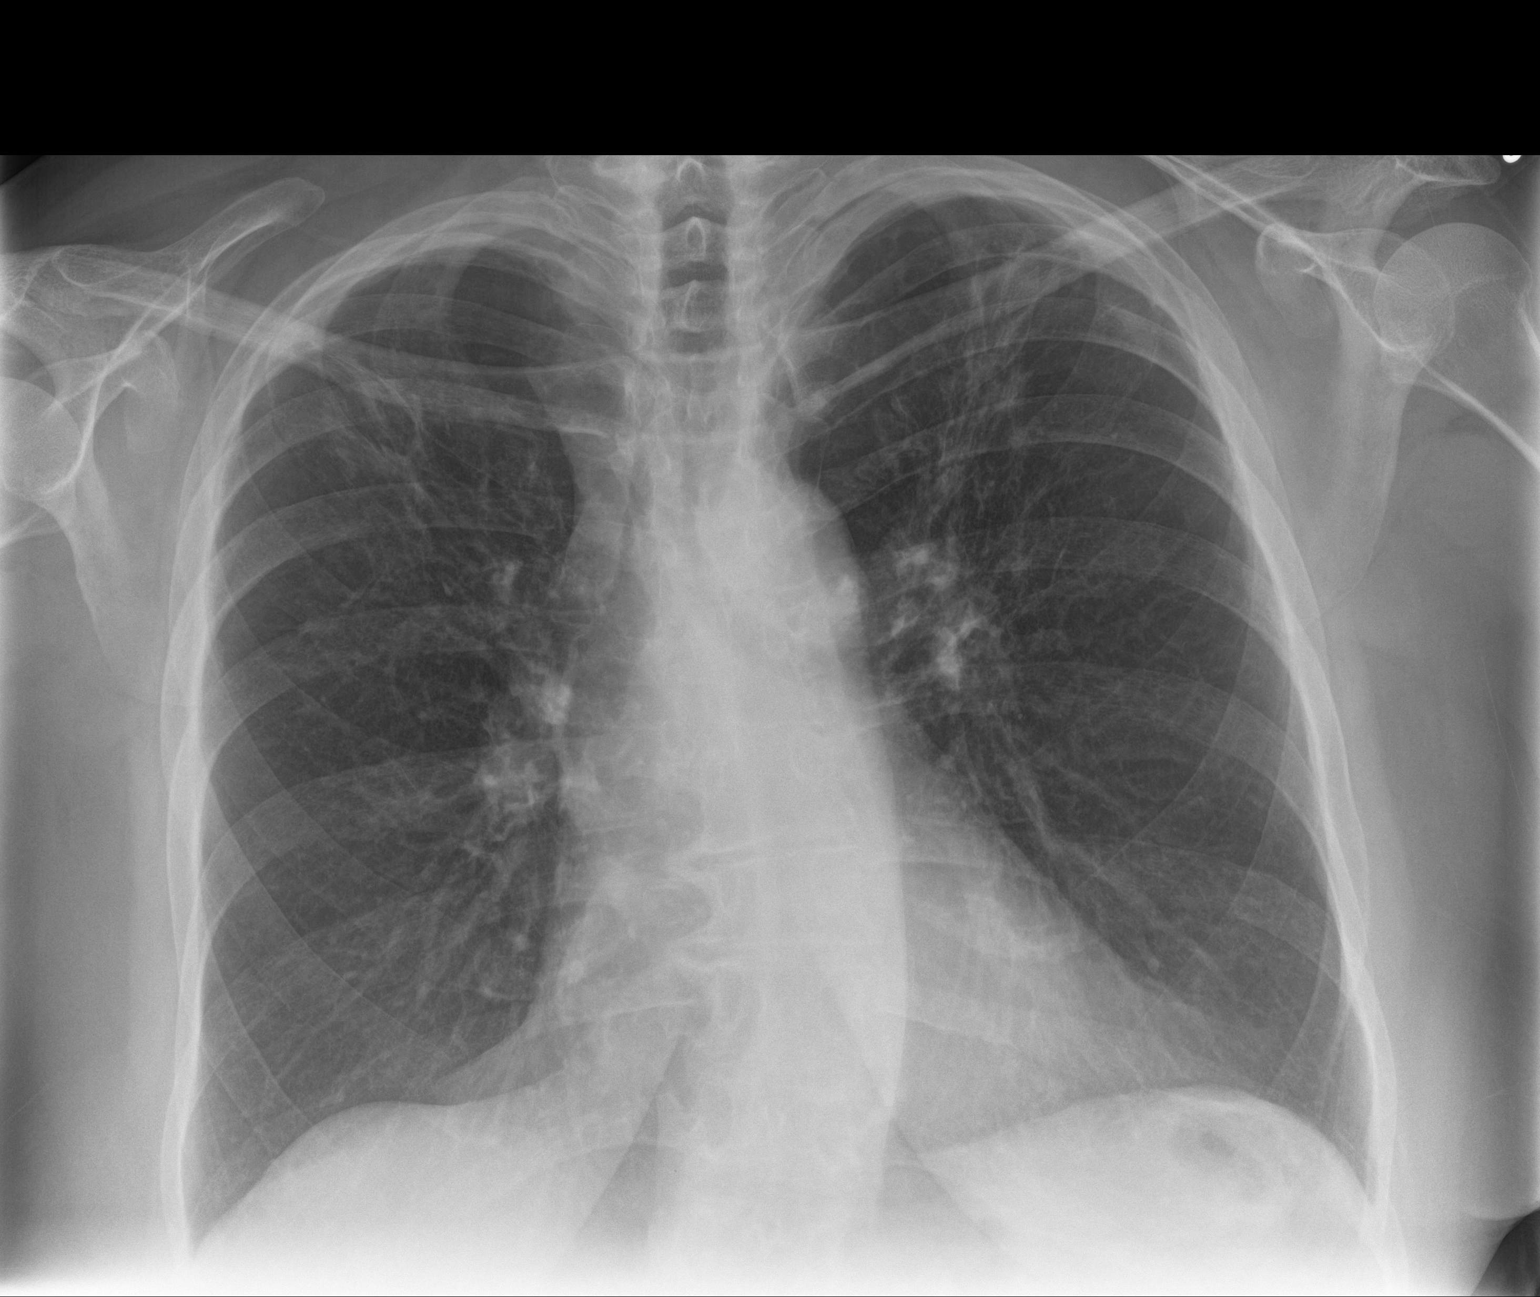

[chest lat]
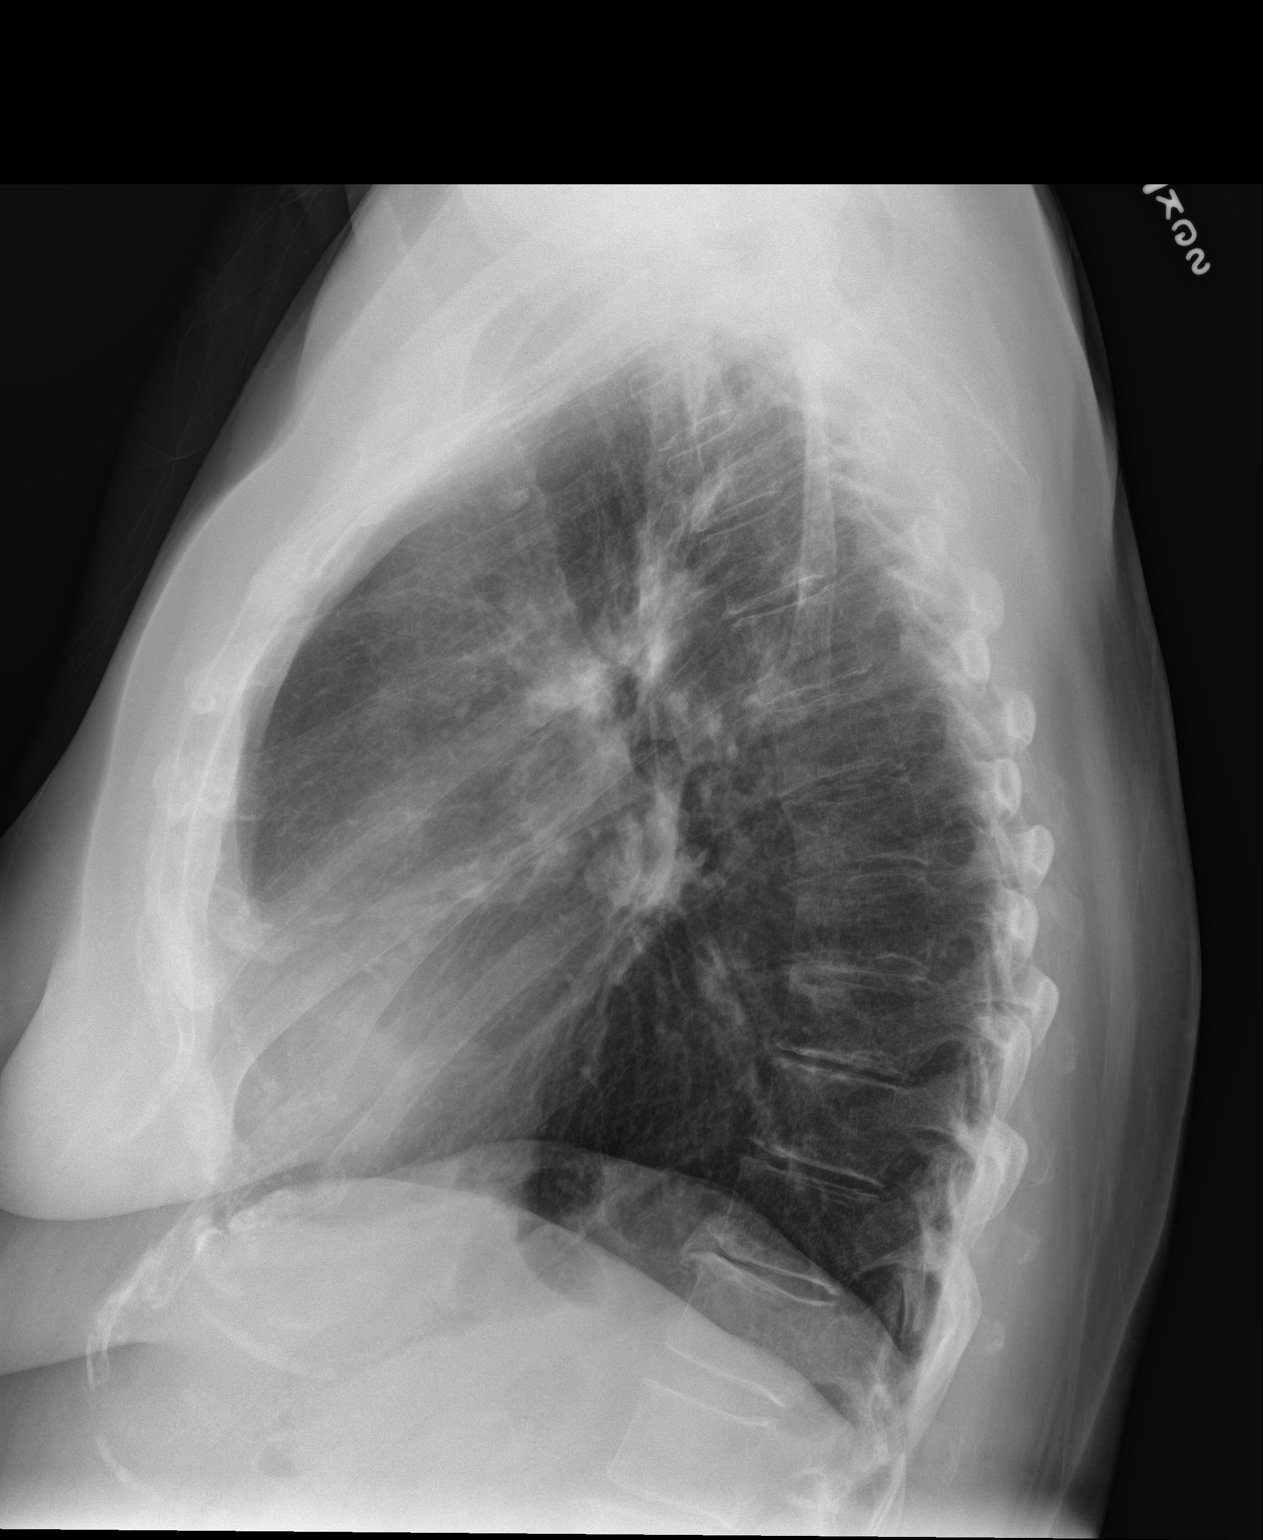

[2 of 2 positions shown; findings below may reference images not displayed]

FINDINGS: Scarring noted in each upper lobe. No edema or airspace opacity. The
heart size and pulmonary vascularity are normal. No adenopathy
appreciable. There is mid to lower thoracic levoscoliosis. There is
degenerative change in the thoracic spine.
IMPRESSION: Scarring in each upper lobe, likely sequela of sarcoidosis. No edema
or airspace opacity. Heart size normal. No appreciable adenopathy.

## 2021-02-28 ENCOUNTER — Other Ambulatory Visit: Payer: Self-pay | Admitting: Internal Medicine

## 2021-02-28 DIAGNOSIS — F410 Panic disorder [episodic paroxysmal anxiety] without agoraphobia: Secondary | ICD-10-CM

## 2021-02-28 NOTE — Telephone Encounter (Signed)
Requested medication (s) are due for refill today: yes  Requested medication (s) are on the active medication list: yes  Last refill:  10/26/20 #20 2 refills   Future visit scheduled: yes   Notes to clinic:  not delegated per protocol do you want to refill Rx?     Requested Prescriptions  Pending Prescriptions Disp Refills   clonazePAM (KLONOPIN) 0.5 MG tablet [Pharmacy Med Name: CLONAZEPAM 0.5 MG TABLET] 20 tablet 2    Sig: TAKE 1 TABLET BY MOUTH TWICE A DAY AS NEEDED FOR ANXIETY     Not Delegated - Psychiatry:  Anxiolytics/Hypnotics Failed - 02/28/2021 10:58 AM      Failed - This refill cannot be delegated      Failed - Urine Drug Screen completed in last 360 days      Passed - Valid encounter within last 6 months    Recent Outpatient Visits           1 week ago Laryngitis, acute   [facility] Glean Hess, MD   3 months ago Essential hypertension   [facility] Glean Hess, MD   10 months ago Annual physical exam   [facility] Glean Hess, MD   1 year ago Essential hypertension   [facility] Glean Hess, MD   1 year ago Annual physical exam   [facility] Glean Hess, MD       Future Appointments             In 2 months [facility] Melia Jesse Sans, MD [facility], [place]   In 11 months Ralene Bathe, MD [place]

## 2021-03-01 NOTE — Telephone Encounter (Signed)
Please review. Last office visit 02/19/2021.  KP

## 2021-05-04 ENCOUNTER — Other Ambulatory Visit: Payer: Self-pay | Admitting: Internal Medicine

## 2021-05-04 DIAGNOSIS — F419 Anxiety disorder, unspecified: Secondary | ICD-10-CM

## 2021-05-04 NOTE — Telephone Encounter (Signed)
Requested Prescriptions  Pending Prescriptions Disp Refills   FLUoxetine (PROZAC) 20 MG capsule [Pharmacy Med Name: FLUOXETINE HCL 20 MG CAPSULE] 90 capsule 1    Sig: TAKE 1 CAPSULE BY MOUTH EVERY DAY     Psychiatry:  Antidepressants - SSRI Passed - 05/04/2021  2:03 AM      Passed - Completed PHQ-2 or PHQ-9 in the last 360 days      Passed - Valid encounter within last 6 months    Recent Outpatient Visits          2 months ago Laryngitis, acute   [facility] Glean Hess, MD   6 months ago Essential hypertension   [facility] Glean Hess, MD   1 year ago Annual physical exam   [facility] Glean Hess, MD   1 year ago Essential hypertension   [facility] Glean Hess, MD   2 years ago Annual physical exam   [facility] Glean Hess, MD      Future Appointments            In 4 days Glean Hess, MD [facility], [place]   In 9 months Ralene Bathe, MD [place]

## 2021-05-08 ENCOUNTER — Encounter: Payer: Self-pay | Admitting: Internal Medicine

## 2021-05-08 ENCOUNTER — Other Ambulatory Visit: Payer: Self-pay

## 2021-05-08 ENCOUNTER — Ambulatory Visit (INDEPENDENT_AMBULATORY_CARE_PROVIDER_SITE_OTHER): Payer: Medicare PPO | Admitting: Internal Medicine

## 2021-05-08 VITALS — BP 110/74 | HR 53 | Ht 68.0 in | Wt 202.0 lb

## 2021-05-08 DIAGNOSIS — I10 Essential (primary) hypertension: Secondary | ICD-10-CM | POA: Diagnosis not present

## 2021-05-08 DIAGNOSIS — F410 Panic disorder [episodic paroxysmal anxiety] without agoraphobia: Secondary | ICD-10-CM | POA: Diagnosis not present

## 2021-05-08 DIAGNOSIS — Z0000 Encounter for general adult medical examination without abnormal findings: Secondary | ICD-10-CM

## 2021-05-08 DIAGNOSIS — D860 Sarcoidosis of lung: Secondary | ICD-10-CM

## 2021-05-08 DIAGNOSIS — E782 Mixed hyperlipidemia: Secondary | ICD-10-CM

## 2021-05-08 DIAGNOSIS — Z1231 Encounter for screening mammogram for malignant neoplasm of breast: Secondary | ICD-10-CM

## 2021-05-08 DIAGNOSIS — Z23 Encounter for immunization: Secondary | ICD-10-CM | POA: Diagnosis not present

## 2021-05-08 DIAGNOSIS — F324 Major depressive disorder, single episode, in partial remission: Secondary | ICD-10-CM

## 2021-05-08 LAB — POCT URINALYSIS DIPSTICK
Bilirubin, UA: NEGATIVE
Blood, UA: NEGATIVE
Glucose, UA: NEGATIVE
Ketones, UA: NEGATIVE
Nitrite, UA: NEGATIVE
Protein, UA: NEGATIVE
Spec Grav, UA: 1.01 (ref 1.010–1.025)
Urobilinogen, UA: 0.2 E.U./dL
pH, UA: 6.5 (ref 5.0–8.0)

## 2021-05-08 MED ORDER — FLUOXETINE HCL 20 MG PO CAPS: ORAL_CAPSULE | ORAL | 3 refills | Status: DC

## 2021-05-08 MED ORDER — SIMVASTATIN 20 MG PO TABS: 20.0000 mg | ORAL_TABLET | Freq: Every day | ORAL | 3 refills | Status: DC

## 2021-05-08 MED ORDER — LOSARTAN POTASSIUM 25 MG PO TABS: 25.0000 mg | ORAL_TABLET | Freq: Every day | ORAL | 3 refills | Status: DC

## 2021-05-08 MED ORDER — ATENOLOL 25 MG PO TABS: 25.0000 mg | ORAL_TABLET | Freq: Every day | ORAL | 3 refills | Status: DC

## 2021-05-08 MED ORDER — CLONAZEPAM 0.5 MG PO TABS: ORAL_TABLET | ORAL | 5 refills | Status: DC

## 2021-05-08 NOTE — Progress Notes (Signed)
Date:  05/08/2021   Name:  Diana Mcdaniel   DOB:  1950-08-21   MRN:  [id number]   Chief Complaint: Annual Exam (Breast exam no pap ) Diana Mcdaniel is a 71 y.o. female who presents today for her Complete Annual Exam. She feels well. She reports exercising walking 1-2 days a week. She reports she is sleeping well. Breast complaints none.  Mammogram: 06/2020 DEXA: 06/2020 Normal Pap smear: discontinued Colonoscopy: 09/2018 repeat 2025  Immunization History  Administered Date(s) Administered   Fluad Quad(high Dose 65+) 11/25/2018, 12/03/2019   Influenza, High Dose Seasonal PF 01/07/2018, 12/26/2020   Moderna SARS-COV2 Booster Vaccination 11/24/2020   Moderna Sars-Covid-2 Vaccination 04/30/2019, 05/29/2019, 01/07/2020, 06/23/2020, 11/24/2020   Pneumococcal Polysaccharide-23 05/04/2020    Hypertension This is a chronic problem. The problem is controlled. Pertinent negatives include no chest pain, headaches, palpitations or shortness of breath. Past treatments include beta blockers and angiotensin blockers. The current treatment provides significant improvement. There is no history of kidney disease, CAD/MI or CVA.  Hyperlipidemia This is a chronic problem. The problem is controlled. Pertinent negatives include no chest pain or shortness of breath. Current antihyperlipidemic treatment includes statins. The current treatment provides moderate improvement of lipids.  Depression        This is a chronic problem.The problem is unchanged.  Associated symptoms include no fatigue and no headaches.  Past treatments include SSRIs - Selective serotonin reuptake inhibitors.  Compliance with treatment is good.  Lab Results  Component Value Date   NA 136 05/04/2020   K 4.7 05/04/2020   CO2 22 05/04/2020   GLUCOSE 107 (H) 05/04/2020   BUN 11 05/04/2020   CREATININE 0.85 05/04/2020   CALCIUM 9.8 05/04/2020   GFRNONAA 69 05/04/2020   Lab Results  Component Value Date   CHOL 217 (H)  05/04/2020   HDL 77 05/04/2020   LDLCALC 120 (H) 05/04/2020   TRIG 115 05/04/2020   CHOLHDL 2.8 05/04/2020   Lab Results  Component Value Date   TSH 4.210 05/04/2020   Lab Results  Component Value Date   HGBA1C 5.5 05/04/2020   Lab Results  Component Value Date   WBC 7.0 05/04/2020   HGB 13.3 05/04/2020   HCT 39.1 05/04/2020   MCV 90 05/04/2020   PLT 291 05/04/2020   Lab Results  Component Value Date   ALT 19 05/04/2020   AST 20 05/04/2020   ALKPHOS 84 05/04/2020   BILITOT 0.7 05/04/2020   Lab Results  Component Value Date   VD25OH 41.8 05/04/2020     Review of Systems  Constitutional:  Negative for chills, fatigue and fever.  HENT:  Negative for congestion, hearing loss, tinnitus, trouble swallowing and voice change.   Eyes:  Negative for visual disturbance.  Respiratory:  Negative for cough, chest tightness, shortness of breath and wheezing.   Cardiovascular:  Negative for chest pain, palpitations and leg swelling.  Gastrointestinal:  Negative for abdominal pain, constipation, diarrhea and vomiting.  Endocrine: Negative for polydipsia and polyuria.  Genitourinary:  Positive for frequency. Negative for dysuria, genital sores, vaginal bleeding and vaginal discharge.  Musculoskeletal:  Negative for arthralgias, gait problem and joint swelling.  Skin:  Negative for color change and rash.  Neurological:  Positive for tremors. Negative for dizziness, light-headedness and headaches.  Hematological:  Negative for adenopathy. Does not bruise/bleed easily.  Psychiatric/Behavioral:  Positive for depression. Negative for dysphoric mood and sleep disturbance. The patient is not nervous/anxious.    Patient Active Problem  List   Diagnosis Date Noted   Elevated TSH 11/02/2019   Primary insomnia 05/03/2019   Family history of colon cancer in father    Polyp of sigmoid colon    Osteopenia determined by x-ray 05/26/2018   Sarcoidosis of lung ([place]) 04/29/2018   Migraine  headache with aura 04/29/2018   Tremor, essential 04/29/2018   Panic disorder 04/29/2018   Essential hypertension 12/01/2017   Mixed hyperlipidemia 12/01/2017   Ganglion cyst of foot 12/01/2017   Major depressive disorder with single episode, in partial remission ([place]) 12/01/2017   Primary osteoarthritis of left hip 12/01/2017    No Known Allergies  Past Surgical History:  Procedure Laterality Date   CARDIAC CATHETERIZATION  2010   Normal   COLONOSCOPY WITH PROPOFOL N/A 09/28/2018   Procedure: COLONOSCOPY WITH  biopsy;  Surgeon: Lucilla Lame, MD;  Location: [place];  Service: Endoscopy;  Laterality: N/A;   POLYPECTOMY  09/28/2018   Procedure: POLYPECTOMY;  Surgeon: Lucilla Lame, MD;  Location: [facility];  Service: Endoscopy;;    Social History   Tobacco Use   Smoking status: Former    Packs/day: 1.00    Years: 15.00    Pack years: 15.00    Types: Cigarettes    Quit date: 03/18/1993    Years since quitting: 28.1   Smokeless tobacco: Never  Vaping Use   Vaping Use: Never used  Substance Use Topics   Alcohol use: Yes    Alcohol/week: 24.0 standard drinks    Types: 24 Glasses of wine per week    Comment: 3-4 glasses wine/day   Drug use: Never     Medication list has been reviewed and updated.  Current Meds  Medication Sig   albuterol (VENTOLIN HFA) 108 (90 Base) MCG/ACT inhaler Inhale 2 puffs into the lungs every 6 (six) hours as needed for wheezing or shortness of breath.   atenolol (TENORMIN) 25 MG tablet Take 1 tablet (25 mg total) by mouth daily.   Calcium-Magnesium-Vitamin D (CALCIUM 1200+D3 PO) Take by mouth daily.   clonazePAM (KLONOPIN) 0.5 MG tablet TAKE 1 TABLET BY MOUTH TWICE A DAY AS NEEDED FOR ANXIETY   FLUoxetine (PROZAC) 20 MG capsule TAKE 1 CAPSULE BY MOUTH EVERY DAY   losartan (COZAAR) 25 MG tablet Take 1 tablet (25 mg total) by mouth daily.   mometasone (ELOCON) 0.1 % cream Apply 1 application topically daily as needed (Rash).    mometasone (ELOCON) 0.1 % lotion Apply topically daily. Apply 1 drop daily 5 days weekly to affected nails   simvastatin (ZOCOR) 20 MG tablet Take 1 tablet (20 mg total) by mouth daily.    PHQ 2/9 Scores 05/08/2021 02/19/2021 01/03/2021 11/01/2020  PHQ - 2 Score 0 0 0 0  PHQ- 9 Score 0 0 - 0    GAD 7 : Generalized Anxiety Score 05/08/2021 02/19/2021 11/01/2020 05/04/2020  Nervous, Anxious, on Edge 0 0 0 1  Control/stop worrying 0 0 0 1  Worry too much - different things 0 0 0 1  Trouble relaxing 0 0 0 0  Restless 0 0 0 0  Easily annoyed or irritable 0 0 0 0  Afraid - awful might happen 0 0 0 0  Total GAD 7 Score 0 0 0 3  Anxiety Difficulty - Not difficult at all Not difficult at all -    BP Readings from Last 3 Encounters:  05/08/21 110/74  02/19/21 106/78  11/01/20 106/68    Physical Exam Vitals and nursing note reviewed.  Constitutional:      General: She is not in acute distress.    Appearance: She is well-developed.  HENT:     Head: Normocephalic and atraumatic.     Right Ear: Tympanic membrane and ear canal normal.     Left Ear: Tympanic membrane and ear canal normal.     Nose:     Right Sinus: No maxillary sinus tenderness.     Left Sinus: No maxillary sinus tenderness.  Eyes:     General: No scleral icterus.       Right eye: No discharge.        Left eye: No discharge.     Conjunctiva/sclera: Conjunctivae normal.  Neck:     Thyroid: No thyromegaly.     Vascular: No carotid bruit.  Cardiovascular:     Rate and Rhythm: Normal rate and regular rhythm.     Pulses: Normal pulses.     Heart sounds: Normal heart sounds.  Pulmonary:     Effort: Pulmonary effort is normal. No respiratory distress.     Breath sounds: No wheezing.  Chest:  Breasts:    Right: No mass, nipple discharge, skin change or tenderness.     Left: No mass, nipple discharge, skin change or tenderness.  Abdominal:     General: Bowel sounds are normal.     Palpations: Abdomen is soft.      Tenderness: There is no abdominal tenderness.  Musculoskeletal:     Cervical back: Normal range of motion. No erythema.     Right lower leg: No edema.     Left lower leg: No edema.  Lymphadenopathy:     Cervical: No cervical adenopathy.  Skin:    General: Skin is warm and dry.     Findings: No rash.  Neurological:     Mental Status: She is alert and oriented to person, place, and time.     Cranial Nerves: No cranial nerve deficit.     Sensory: No sensory deficit.     Motor: Tremor (of head) present.     Deep Tendon Reflexes: Reflexes are normal and symmetric.  Psychiatric:        Attention and Perception: Attention normal.        Mood and Affect: Mood normal.    Wt Readings from Last 3 Encounters:  05/08/21 202 lb (91.6 kg)  02/19/21 199 lb (90.3 kg)  11/01/20 196 lb (88.9 kg)    BP 110/74    Pulse (!) 53    Ht 5\' 8"  (1.727 m)    Wt 202 lb (91.6 kg)    SpO2 97%    BMI 30.71 kg/m   Assessment and Plan: 1. Annual physical exam Normal exam. Up to date on screenings and immunizations. Continue healthy diet and exercise as able  2. Encounter for screening mammogram for breast cancer - MM 3D SCREEN BREAST BILATERAL  3. Essential hypertension Clinically stable exam with well controlled BP. Tolerating medications without side effects at this time. Pt to continue current regimen and low sodium diet; benefits of regular exercise as able discussed. - CBC with Differential/Platelet - Comprehensive metabolic panel - POCT urinalysis dipstick - atenolol (TENORMIN) 25 MG tablet; Take 1 tablet (25 mg total) by mouth daily.  Dispense: 90 tablet; Refill: 3 - losartan (COZAAR) 25 MG tablet; Take 1 tablet (25 mg total) by mouth daily.  Dispense: 90 tablet; Refill: 3  4. Mixed hyperlipidemia Tolerating statin medication without side effects at this time LDL is at goal of <  70 on current dose Continue same therapy without change at this time. - Lipid panel - simvastatin (ZOCOR) 20 MG  tablet; Take 1 tablet (20 mg total) by mouth daily.  Dispense: 90 tablet; Refill: 3  5. Major depressive disorder with single episode, in partial remission (HCC) Clinically stable on current regimen with good control of symptoms, No SI or HI. Will continue current therapy. - TSH - FLUoxetine (PROZAC) 20 MG capsule; TAKE 1 CAPSULE BY MOUTH EVERY DAY  Dispense: 90 capsule; Refill: 3  6. Sarcoidosis of lung (HCC) Stable symptoms relieved by PRN albuterol.  7. Need for vaccination for pneumococcus - Pneumococcal conjugate vaccine 20-valent  8. Panic disorder Takes 1/2 tablet qhs to relieve anxiety and aid sleep - clonazePAM (KLONOPIN) 0.5 MG tablet; TAKE 1 TABLET BY MOUTH TWICE A DAY AS NEEDED FOR ANXIETY  Dispense: 20 tablet; Refill: 5   Partially dictated using Editor, commissioning. Any errors are unintentional.  Halina Maidens, MD [facility]  05/08/2021

## 2021-05-09 LAB — COMPREHENSIVE METABOLIC PANEL
ALT: 13 IU/L (ref 0–32)
AST: 20 IU/L (ref 0–40)
Albumin/Globulin Ratio: 1.6 (ref 1.2–2.2)
Albumin: 4.6 g/dL (ref 3.7–4.7)
Alkaline Phosphatase: 87 IU/L (ref 44–121)
BUN/Creatinine Ratio: 12 (ref 12–28)
BUN: 10 mg/dL (ref 8–27)
Bilirubin Total: 0.7 mg/dL (ref 0.0–1.2)
CO2: 26 mmol/L (ref 20–29)
Calcium: 9.8 mg/dL (ref 8.7–10.3)
Chloride: 98 mmol/L (ref 96–106)
Creatinine, Ser: 0.84 mg/dL (ref 0.57–1.00)
Globulin, Total: 2.9 g/dL (ref 1.5–4.5)
Glucose: 100 mg/dL — ABNORMAL HIGH (ref 70–99)
Potassium: 4.7 mmol/L (ref 3.5–5.2)
Sodium: 137 mmol/L (ref 134–144)
Total Protein: 7.5 g/dL (ref 6.0–8.5)
eGFR: 74 mL/min/{1.73_m2} (ref >59)

## 2021-05-09 LAB — CBC WITH DIFFERENTIAL/PLATELET
Basophils Absolute: 0 10*3/uL (ref 0.0–0.2)
Basos: 1 %
EOS (ABSOLUTE): 0.1 10*3/uL (ref 0.0–0.4)
Eos: 3 %
Hematocrit: 39.3 % (ref 34.0–46.6)
Hemoglobin: 13.3 g/dL (ref 11.1–15.9)
Immature Grans (Abs): 0 10*3/uL (ref 0.0–0.1)
Immature Granulocytes: 0 %
Lymphocytes Absolute: 0.8 10*3/uL (ref 0.7–3.1)
Lymphs: 18 %
MCH: 29.2 pg (ref 26.6–33.0)
MCHC: 33.8 g/dL (ref 31.5–35.7)
MCV: 86 fL (ref 79–97)
Monocytes Absolute: 0.6 10*3/uL (ref 0.1–0.9)
Monocytes: 13 %
Neutrophils Absolute: 2.8 10*3/uL (ref 1.4–7.0)
Neutrophils: 65 %
Platelets: 293 10*3/uL (ref 150–450)
RBC: 4.55 x10E6/uL (ref 3.77–5.28)
RDW: 12.2 % (ref 11.7–15.4)
WBC: 4.3 10*3/uL (ref 3.4–10.8)

## 2021-05-09 LAB — LIPID PANEL
Chol/HDL Ratio: 2.9 ratio (ref 0.0–4.4)
Cholesterol, Total: 205 mg/dL — ABNORMAL HIGH (ref 100–199)
HDL: 71 mg/dL (ref >39)
LDL Chol Calc (NIH): 114 mg/dL — ABNORMAL HIGH (ref 0–99)
Triglycerides: 114 mg/dL (ref 0–149)
VLDL Cholesterol Cal: 20 mg/dL (ref 5–40)

## 2021-05-09 LAB — TSH: TSH: 3.72 u[IU]/mL (ref 0.450–4.500)

## 2021-07-10 DIAGNOSIS — M19071 Primary osteoarthritis, right ankle and foot: Secondary | ICD-10-CM | POA: Diagnosis not present

## 2021-07-10 DIAGNOSIS — M65871 Other synovitis and tenosynovitis, right ankle and foot: Secondary | ICD-10-CM | POA: Diagnosis not present

## 2021-07-16 ENCOUNTER — Ambulatory Visit
Admission: RE | Admit: 2021-07-16 | Discharge: 2021-07-16 | Disposition: A | Discharge status: Home / Self Care | Payer: Medicare PPO | Source: Ambulatory Visit | Attending: Internal Medicine | Admitting: Internal Medicine

## 2021-07-16 DIAGNOSIS — Z1231 Encounter for screening mammogram for malignant neoplasm of breast: Secondary | ICD-10-CM | POA: Diagnosis not present

## 2021-10-04 ENCOUNTER — Other Ambulatory Visit: Payer: Self-pay | Admitting: Dermatology

## 2021-10-04 DIAGNOSIS — L300 Nummular dermatitis: Secondary | ICD-10-CM

## 2021-11-05 ENCOUNTER — Encounter: Payer: Self-pay | Admitting: Internal Medicine

## 2021-11-05 ENCOUNTER — Ambulatory Visit: Payer: Medicare PPO | Admitting: Internal Medicine

## 2021-11-05 ENCOUNTER — Other Ambulatory Visit: Payer: Self-pay | Admitting: Internal Medicine

## 2021-11-05 VITALS — BP 122/68 | HR 48 | Ht 68.0 in | Wt 204.8 lb

## 2021-11-05 DIAGNOSIS — G8929 Other chronic pain: Secondary | ICD-10-CM | POA: Diagnosis not present

## 2021-11-05 DIAGNOSIS — E782 Mixed hyperlipidemia: Secondary | ICD-10-CM

## 2021-11-05 DIAGNOSIS — I10 Essential (primary) hypertension: Secondary | ICD-10-CM | POA: Diagnosis not present

## 2021-11-05 DIAGNOSIS — F324 Major depressive disorder, single episode, in partial remission: Secondary | ICD-10-CM

## 2021-11-05 DIAGNOSIS — G250 Essential tremor: Secondary | ICD-10-CM

## 2021-11-05 DIAGNOSIS — M25562 Pain in left knee: Secondary | ICD-10-CM | POA: Diagnosis not present

## 2021-11-05 DIAGNOSIS — F410 Panic disorder [episodic paroxysmal anxiety] without agoraphobia: Secondary | ICD-10-CM

## 2021-11-05 NOTE — Progress Notes (Signed)
Date:  11/05/2021   Name:  Diana Mcdaniel   DOB:  1951-02-18   MRN:  [id number]   Chief Complaint: Hypertension and Hyperlipidemia  Hypertension This is a chronic problem. The problem is controlled. Pertinent negatives include no chest pain, headaches, palpitations or shortness of breath. Past treatments include beta blockers and angiotensin blockers. The current treatment provides significant improvement.  Knee Pain  There was no injury mechanism. The pain is present in the left knee. The quality of the pain is described as aching. The pain is mild. Treatments tried: tylenol and topical rubs. The treatment provided moderate relief.  Tremor - has had a mild head tremor for some time.  She thinks it may be slightly worse.  It does not change with alcohol intake.  It does not interfere with any usual activities.  She has not noticed any tremor of her hands/arms.  Lab Results  Component Value Date   NA 137 05/08/2021   K 4.7 05/08/2021   CO2 26 05/08/2021   GLUCOSE 100 (H) 05/08/2021   BUN 10 05/08/2021   CREATININE 0.84 05/08/2021   CALCIUM 9.8 05/08/2021   EGFR 74 05/08/2021   GFRNONAA 69 05/04/2020   Lab Results  Component Value Date   CHOL 205 (H) 05/08/2021   HDL 71 05/08/2021   LDLCALC 114 (H) 05/08/2021   TRIG 114 05/08/2021   CHOLHDL 2.9 05/08/2021   Lab Results  Component Value Date   TSH 3.720 05/08/2021   Lab Results  Component Value Date   HGBA1C 5.5 05/04/2020   Lab Results  Component Value Date   WBC 4.3 05/08/2021   HGB 13.3 05/08/2021   HCT 39.3 05/08/2021   MCV 86 05/08/2021   PLT 293 05/08/2021   Lab Results  Component Value Date   ALT 13 05/08/2021   AST 20 05/08/2021   ALKPHOS 87 05/08/2021   BILITOT 0.7 05/08/2021   Lab Results  Component Value Date   VD25OH 41.8 05/04/2020     Review of Systems  Constitutional:  Negative for fatigue and unexpected weight change.  HENT:  Negative for nosebleeds.   Eyes:  Negative for visual  disturbance.  Respiratory:  Negative for cough, chest tightness, shortness of breath and wheezing.   Cardiovascular:  Negative for chest pain, palpitations and leg swelling.  Gastrointestinal:  Negative for abdominal pain, constipation and diarrhea.  Musculoskeletal:  Positive for arthralgias. Negative for gait problem and joint swelling.  Neurological:  Positive for tremors. Negative for dizziness, weakness, light-headedness and headaches.  Psychiatric/Behavioral:  Negative for sleep disturbance. The patient is not nervous/anxious.     Patient Active Problem List   Diagnosis Date Noted   Elevated TSH 11/02/2019   Primary insomnia 05/03/2019   Family history of colon cancer in father    Polyp of sigmoid colon    Osteopenia determined by x-ray 05/26/2018   Sarcoidosis of lung ([place]) 04/29/2018   Migraine headache with aura 04/29/2018   Tremor, essential 04/29/2018   Panic disorder 04/29/2018   Essential hypertension 12/01/2017   Mixed hyperlipidemia 12/01/2017   Ganglion cyst of foot 12/01/2017   Major depressive disorder with single episode, in partial remission ([place]) 12/01/2017   Primary osteoarthritis of left hip 12/01/2017    No Known Allergies  Past Surgical History:  Procedure Laterality Date   CARDIAC CATHETERIZATION  2010   Normal   COLONOSCOPY WITH PROPOFOL N/A 09/28/2018   Procedure: COLONOSCOPY WITH  biopsy;  Surgeon: Lucilla Lame, MD;  Location:  [place];  Service: Endoscopy;  Laterality: N/A;   POLYPECTOMY  09/28/2018   Procedure: POLYPECTOMY;  Surgeon: Lucilla Lame, MD;  Location: [facility];  Service: Endoscopy;;    Social History   Tobacco Use   Smoking status: Former    Packs/day: 1.00    Years: 15.00    Total pack years: 15.00    Types: Cigarettes    Quit date: 03/18/1993    Years since quitting: 28.6   Smokeless tobacco: Never  Vaping Use   Vaping Use: Never used  Substance Use Topics   Alcohol use: Yes    Alcohol/week: 24.0  standard drinks of alcohol    Types: 24 Glasses of wine per week    Comment: 3-4 glasses wine/day   Drug use: Never     Medication list has been reviewed and updated.  Current Meds  Medication Sig   albuterol (VENTOLIN HFA) 108 (90 Base) MCG/ACT inhaler Inhale 2 puffs into the lungs every 6 (six) hours as needed for wheezing or shortness of breath.   atenolol (TENORMIN) 25 MG tablet Take 1 tablet (25 mg total) by mouth daily.   Calcium-Magnesium-Vitamin D (CALCIUM 1200+D3 PO) Take by mouth daily.   clonazePAM (KLONOPIN) 0.5 MG tablet TAKE 1 TABLET BY MOUTH TWICE A DAY AS NEEDED FOR ANXIETY   FLUoxetine (PROZAC) 20 MG capsule TAKE 1 CAPSULE BY MOUTH EVERY DAY   losartan (COZAAR) 25 MG tablet Take 1 tablet (25 mg total) by mouth daily.   mometasone (ELOCON) 0.1 % cream APPLY 1 APPLICATION TOPICALLY DAILY AS NEEDED (RASH).   mometasone (ELOCON) 0.1 % lotion Apply topically daily. Apply 1 drop daily 5 days weekly to affected nails   simvastatin (ZOCOR) 20 MG tablet Take 1 tablet (20 mg total) by mouth daily.       11/05/2021    9:37 AM 05/08/2021   10:00 AM 02/19/2021   10:36 AM 11/01/2020    1:45 PM  GAD 7 : Generalized Anxiety Score  Nervous, Anxious, on Edge 2 0 0 0  Control/stop worrying 0 0 0 0  Worry too much - different things 0 0 0 0  Trouble relaxing 0 0 0 0  Restless 0 0 0 0  Easily annoyed or irritable 0 0 0 0  Afraid - awful might happen 0 0 0 0  Total GAD 7 Score 2 0 0 0  Anxiety Difficulty Not difficult at all  Not difficult at all Not difficult at all       11/05/2021    9:37 AM 05/08/2021   10:00 AM 02/19/2021   10:36 AM  Depression screen PHQ 2/9  Decreased Interest 0 0 0  Down, Depressed, Hopeless 0 0 0  PHQ - 2 Score 0 0 0  Altered sleeping 0 0 0  Tired, decreased energy 0 0 0  Change in appetite 0 0 0  Feeling bad or failure about yourself  0 0 0  Trouble concentrating 0 0 0  Moving slowly or fidgety/restless 0 0 0  Suicidal thoughts 0 0 0  PHQ-9 Score  0 0 0  Difficult doing work/chores Not difficult at all Not difficult at all Not difficult at all    BP Readings from Last 3 Encounters:  11/05/21 122/68  05/08/21 110/74  02/19/21 106/78    Physical Exam Vitals and nursing note reviewed.  Constitutional:      General: She is not in acute distress.    Appearance: She is well-developed.  HENT:  Head: Normocephalic and atraumatic.  Cardiovascular:     Rate and Rhythm: Normal rate and regular rhythm.  Pulmonary:     Effort: Pulmonary effort is normal. No respiratory distress.     Breath sounds: No wheezing or rhonchi.  Musculoskeletal:     Cervical back: Normal range of motion.     Right knee: Normal.     Left knee: Normal. No swelling, effusion or bony tenderness. Normal range of motion. No tenderness.     Right lower leg: No edema.     Left lower leg: No edema.  Skin:    General: Skin is warm and dry.     Findings: No rash.  Neurological:     Mental Status: She is alert and oriented to person, place, and time.     Cranial Nerves: Cranial nerves 2-12 are intact.     Sensory: Sensation is intact.     Motor: Tremor (mild UE intention tremor) present. No weakness or pronator drift.     Coordination: Coordination is intact.  Psychiatric:        Mood and Affect: Mood normal.        Behavior: Behavior normal.     Wt Readings from Last 3 Encounters:  11/05/21 204 lb 12.8 oz (92.9 kg)  05/08/21 202 lb (91.6 kg)  02/19/21 199 lb (90.3 kg)    BP 122/68 (BP Location: Right Arm, Cuff Size: Normal)   Pulse (!) 48   Ht '5\' 8"'  (1.727 m)   Wt 204 lb 12.8 oz (92.9 kg)   SpO2 97%   BMI 31.14 kg/m   Assessment and Plan: 1. Essential hypertension Clinically stable exam with well controlled BP. Tolerating medications without side effects at this time. Pt to continue current regimen and low sodium diet; benefits of regular exercise as able discussed.  2. Tremor, essential Felt to be essential tremor - since it is worsening,  will refer to Neurology for official dx. - Ambulatory referral to Neurology  3. Mixed hyperlipidemia Continue statin therapy  4. Major depressive disorder with single episode, in partial remission (HCC) Clinically stable on current regimen with good control of symptoms, No SI or HI. Will continue current therapy.  5. Chronic pain of left knee Continue Tylenol as needed; topical rubs Follow up if worsening   Partially dictated using Editor, commissioning. Any errors are unintentional.  Halina Maidens, MD [facility]  11/05/2021

## 2021-11-06 NOTE — Telephone Encounter (Signed)
Please review. Last office visit 11/05/21.  KP

## 2021-11-06 NOTE — Telephone Encounter (Signed)
Requested medication (s) are due for refill today: yes  Requested medication (s) are on the active medication list: yes  Last refill:  05/08/21 #20 5 RF  Future visit scheduled: yes  Notes to clinic:  med not delegated to NT to RF   Requested Prescriptions  Pending Prescriptions Disp Refills   clonazePAM (KLONOPIN) 0.5 MG tablet [Pharmacy Med Name: CLONAZEPAM 0.5 MG TABLET] 20 tablet     Sig: TAKE 1 TABLET BY MOUTH TWICE A DAY AS NEEDED FOR ANXIETY     Not Delegated - Psychiatry: Anxiolytics/Hypnotics 2 Failed - 11/05/2021  4:06 PM      Failed - This refill cannot be delegated      Failed - Urine Drug Screen completed in last 360 days      Passed - Patient is not pregnant      Passed - Valid encounter within last 6 months    Recent Outpatient Visits           Yesterday Essential hypertension   [place] [facility] at [facility], Jesse Sans, MD   6 months ago Annual physical exam   [place] [facility] at [facility] Jesse Sans, MD   8 months ago Laryngitis, acute   [place] [facility] at [facility], Jesse Sans, MD   1 year ago Essential hypertension   [place] [facility] at [facility], Jesse Sans, MD   1 year ago Annual physical exam   [facility] at [facility], Jesse Sans, MD       Future Appointments             In 3 months Ralene Bathe, MD [place]   In 6 months [facility] Melia, Jesse Sans, MD [facility] at [facility], [facility]

## 2021-12-19 DIAGNOSIS — F411 Generalized anxiety disorder: Secondary | ICD-10-CM | POA: Diagnosis not present

## 2021-12-19 DIAGNOSIS — G250 Essential tremor: Secondary | ICD-10-CM | POA: Diagnosis not present

## 2021-12-19 DIAGNOSIS — M542 Cervicalgia: Secondary | ICD-10-CM | POA: Diagnosis not present

## 2022-01-07 ENCOUNTER — Ambulatory Visit: Payer: Medicare PPO

## 2022-01-09 ENCOUNTER — Ambulatory Visit (INDEPENDENT_AMBULATORY_CARE_PROVIDER_SITE_OTHER): Payer: Medicare PPO

## 2022-01-09 VITALS — Ht 68.0 in | Wt 204.8 lb

## 2022-01-09 DIAGNOSIS — Z0000 Encounter for general adult medical examination without abnormal findings: Secondary | ICD-10-CM | POA: Diagnosis not present

## 2022-01-09 NOTE — Progress Notes (Signed)
Subjective:   Diana Mcdaniel is a 71 y.o. female who presents for Medicare Annual (Subsequent) preventive examination. I connected with  Phiona Ramnauth on 01/09/22 by a audio enabled telemedicine application and verified that I am speaking with the correct person using two identifiers.  Patient Location: Home  Provider Location: Office/Clinic  I discussed the limitations of evaluation and management by telemedicine. The patient expressed understanding and agreed to proceed.    Review of Systems    Defer to PCP Cardiac Risk Factors include: advanced age (>18mn, >>60women)     Objective:    Today's Vitals   01/09/22 1417  Weight: 204 lb 12.8 oz (92.9 kg)  Height: '5\' 8"'$  (1.727 m)   Body mass index is 31.14 kg/m.     01/09/2022    2:24 PM 01/03/2021    3:28 PM 01/03/2020    9:44 AM 09/28/2018    7:15 AM  Advanced Directives  Does Patient Have a Medical Advance Directive? Yes Yes Yes Yes  Type of Advance Directive [place]   Does patient want to make changes to medical advance directive?    No - Patient declined  Copy of HHoltin Chart?  No - copy requested No - copy requested     Current Medications (verified) Outpatient Encounter Medications as of 01/09/2022  Medication Sig   albuterol (VENTOLIN HFA) 108 (90 Base) MCG/ACT inhaler Inhale 2 puffs into the lungs every 6 (six) hours as needed for wheezing or shortness of breath.   atenolol (TENORMIN) 25 MG tablet Take 1 tablet (25 mg total) by mouth daily.   Calcium-Magnesium-Vitamin D (CALCIUM 1200+D3 PO) Take by mouth daily.   clonazePAM (KLONOPIN) 0.5 MG tablet TAKE 1 TABLET BY MOUTH TWICE A DAY AS NEEDED FOR ANXIETY   FLUoxetine (PROZAC) 20 MG capsule TAKE 1 CAPSULE BY MOUTH EVERY DAY   losartan (COZAAR) 25 MG tablet Take 1 tablet (25 mg total) by mouth daily.   mometasone (ELOCON) 0.1 % cream APPLY 1  APPLICATION TOPICALLY DAILY AS NEEDED (RASH).   mometasone (ELOCON) 0.1 % lotion Apply topically daily. Apply 1 drop daily 5 days weekly to affected nails   simvastatin (ZOCOR) 20 MG tablet Take 1 tablet (20 mg total) by mouth daily.   No facility-administered encounter medications on file as of 01/09/2022.    Allergies (verified) Patient has no known allergies.   History: Past Medical History:  Diagnosis Date   Anxiety    Arthritis 2005   Hyperlipidemia    Hypertension    Major depressive disorder with single episode, in partial remission (HCC)    Migraine headache    1-2x/year   Sarcoidosis    Past Surgical History:  Procedure Laterality Date   CARDIAC CATHETERIZATION  2010   Normal   COLONOSCOPY WITH PROPOFOL N/A 09/28/2018   Procedure: COLONOSCOPY WITH  biopsy;  Surgeon: WLucilla Lame MD;  [facility]  Service: Endoscopy;  Laterality: N/A;   POLYPECTOMY  09/28/2018   Procedure: POLYPECTOMY;  Surgeon: WLucilla Lame MD;  [facility];  Service: Endoscopy;;   Family History  Problem Relation Age of Onset   Colon cancer Father    Skin cancer Sister    Breast cancer Sister 738  Skin cancer Brother    Breast cancer Maternal Grandmother 70   Diabetes Neg Hx    Heart disease Neg Hx    Hypertension Neg  Hx    Social History   Socioeconomic History   Marital status: Married    Spouse name: Not on file   Number of children: 1   Years of education: Not on file   Highest education level: Not on file  Occupational History   Not on file  Tobacco Use   Smoking status: Former    Packs/day: 1.00    Years: 15.00    Total pack years: 15.00    Types: Cigarettes    Quit date: 03/18/1993    Years since quitting: 28.8   Smokeless tobacco: Never  Vaping Use   Vaping Use: Never used  Substance and Sexual Activity   Alcohol use: Yes    Alcohol/week: 24.0 standard drinks of alcohol    Types: 24 Glasses of wine per week    Comment: 3-4 glasses  wine/day   Drug use: Never   Sexual activity: Yes    Birth control/protection: Post-menopausal  Other Topics Concern   Not on file  Social History Narrative   Not on file   Social Determinants of Health   Financial Resource Strain: Low Risk  (01/09/2022)   Overall Financial Resource Strain (CARDIA)    Difficulty of Paying Living Expenses: Not hard at all  Food Insecurity: No Food Insecurity (01/09/2022)   Hunger Vital Sign    Worried About Running Out of Food in the Last Year: Never true    Ran Out of Food in the Last Year: Never true  Transportation Needs: No Transportation Needs (01/03/2021)   PRAPARE - Hydrologist (Medical): No    Lack of Transportation (Non-Medical): No  Physical Activity: Insufficiently Active (01/09/2022)   Exercise Vital Sign    Days of Exercise per Week: 1 day    Minutes of Exercise per Session: 30 min  Stress: No Stress Concern Present (01/09/2022)   [place]    Feeling of Stress : Not at all  Social Connections: Moderately Isolated (01/09/2022)   Social Connection and Isolation Panel [NHANES]    Frequency of Communication with Friends and Family: More than three times a week    Frequency of Social Gatherings with Friends and Family: More than three times a week    Attends Religious Services: Never    Marine scientist or Organizations: No    Attends Music therapist: Never    Marital Status: Married    Tobacco Counseling Counseling given: Not Answered   Clinical Intake:  Pre-visit preparation completed: Yes  Pain : No/denies pain     BMI - recorded: 31.14 Nutritional Status: BMI > 30  Obese Diabetes: No     Diabetic? No.     Information entered by :: Wyatt Haste, [place] of Daily Living    01/09/2022    2:25 PM 05/08/2021   10:00 AM  In your present state of health, do you have any difficulty  performing the following activities:  Hearing? 0 0  Vision? 0 0  Difficulty concentrating or making decisions? 0 0  Walking or climbing stairs? 0 0  Dressing or bathing? 0 0  Doing errands, shopping? 0 0  Preparing Food and eating ? N   Using the Toilet? N   In the past six months, have you accidently leaked urine? N   Do you have problems with loss of bowel control? N   Managing your Medications? N   Managing your Finances? N  Housekeeping or managing your Housekeeping? N     Patient Care Team: Glean Hess, MD as PCP - General (Internal Medicine) Ralene Bathe, MD (Dermatology) Samara Deist, DPM as Referring Physician (Podiatry)  Indicate any recent Medical Services you may have received from other than [facility] providers in the past year (date may be approximate).     Assessment:   This is a routine wellness examination for Kimberlly.  Hearing/Vision screen Hearing Screening - Comments:: No concerns. Vision Screening - Comments:: Wears glasses.  Dietary issues and exercise activities discussed: Current Exercise Habits: The patient does not participate in regular exercise at present   Goals Addressed   None   Depression Screen    01/09/2022    2:23 PM 11/05/2021    9:37 AM 05/08/2021   10:00 AM 02/19/2021   10:36 AM 01/03/2021    3:27 PM 11/01/2020    1:45 PM 05/04/2020    8:54 AM  PHQ 2/9 Scores  PHQ - 2 Score 0 0 0 0 0 0 0  PHQ- 9 Score 0 0 0 0  0 0    Fall Risk    01/09/2022    2:25 PM 11/05/2021    9:37 AM 05/08/2021   10:00 AM 02/19/2021   10:37 AM 01/03/2021    3:29 PM  [place] in the past year? 0 0 0 0 0  Number falls in past yr: 0 0 0 0 0  Injury with Fall? 0 0 0 0 0  Risk for fall due to : No Fall Risks No Fall Risks No Fall Risks No Fall Risks No Fall Risks  Follow up Falls evaluation completed Falls evaluation completed Falls evaluation completed Falls evaluation completed Falls prevention discussed    FALL RISK PREVENTION  PERTAINING TO THE HOME:  Any stairs in or around the home? Yes  If so, are there any without handrails? Yes  Home free of loose throw rugs in walkways, pet beds, electrical cords, etc? Yes  Adequate lighting in your home to reduce risk of falls? Yes   ASSISTIVE DEVICES UTILIZED TO PREVENT FALLS:  Life alert? No  Use of a cane, walker or w/c? No  Grab bars in the bathroom? Yes  Shower chair or bench in shower? Yes  Elevated toilet seat or a handicapped toilet? Yes   Cognitive Function:        01/09/2022    2:25 PM 01/03/2020    9:45 AM  6CIT Screen  What Year? 0 points 0 points  What month? 0 points 0 points  What time? 0 points 0 points  Count back from 20 0 points 0 points  Months in reverse 0 points 0 points  Repeat phrase 0 points 0 points  Total Score 0 points 0 points    Immunizations Immunization History  Administered Date(s) Administered   Fluad Quad(high Dose 65+) 11/25/2018, 12/03/2019   Influenza, High Dose Seasonal PF 01/07/2018, 12/26/2020, 11/28/2021   Moderna SARS-COV2 Booster Vaccination 11/24/2020   Moderna Sars-Covid-2 Vaccination 04/30/2019, 05/29/2019, 01/07/2020, 06/23/2020, 11/24/2020   PNEUMOCOCCAL CONJUGATE-20 05/08/2021   Pneumococcal Polysaccharide-23 05/04/2020   Rsv, Bivalent, Protein Subunit Rsvpref,pf Evans Lance) 11/28/2021   Tdap 11/28/2021   Unspecified SARS-COV-2 Vaccination 12/10/2021    TDAP status: Up to date  Flu Vaccine status: Up to date  Pneumococcal vaccine status: Up to date  Covid-19 vaccine status: Completed vaccines  Qualifies for Shingles Vaccine? Yes   Zostavax completed No   Shingrix Completed?: No.  Education has been provided regarding the importance of this vaccine. Patient has been advised to call insurance company to determine out of pocket expense if they have not yet received this vaccine. Advised may also receive vaccine at local pharmacy or Health Dept. Verbalized acceptance and understanding.  Screening  Tests Health Maintenance  Topic Date Due   Zoster Vaccines- Shingrix (1 of 2) Never done   COVID-19 Vaccine (6 - Moderna risk series) 02/04/2022   MAMMOGRAM  07/17/2022   Medicare Annual Wellness (AWV)  02/09/2023   COLONOSCOPY (Pts 45-23yr Insurance coverage will need to be confirmed)  09/28/2023   TETANUS/TDAP  11/29/2031   Pneumonia Vaccine 71 Years old  Completed   INFLUENZA VACCINE  Completed   DEXA SCAN  Completed   Hepatitis C Screening  Completed   HPV VACCINES  Aged Out    Health Maintenance  Health Maintenance Due  Topic Date Due   Zoster Vaccines- Shingrix (1 of 2) Never done    Colorectal cancer screening: Type of screening: Colonoscopy. Completed 09/28/2018. Repeat every 5 years  Mammogram status: Completed 07/16/2021. Repeat every year  Lung Cancer Screening: (Low Dose CT Chest recommended if Age 73-80years, 30 pack-year currently smoking OR have quit w/in 15years.) does not qualify.   Lung Cancer Screening Referral: N/A  Additional Screening:  Hepatitis C Screening: does qualify; Completed 04/29/2018  Vision Screening: Recommended annual ophthalmology exams for early detection of glaucoma and other disorders of the eye. Is the patient up to date with their annual eye exam?  Yes  Who is the provider or what is the name of the office in which the patient attends annual eye exams? [place] is not established with a provider, would they like to be referred to a provider to establish care?  N/A .   Dental Screening: Recommended annual dental exams for proper oral hygiene  Community Resource Referral / Chronic Care Management: CRR required this visit?  No   CCM required this visit?  No      Plan:     I have personally reviewed and noted the following in the patient's chart:   Medical and social history Use of alcohol, tobacco or illicit drugs  Current medications and supplements including opioid prescriptions. Patient is not  currently taking opioid prescriptions. Functional ability and status Nutritional status Physical activity Advanced directives List of other physicians Hospitalizations, surgeries, and ER visits in previous 12 months Vitals Screenings to include cognitive, depression, and falls Referrals and appointments  In addition, I have reviewed and discussed with patient certain preventive protocols, quality metrics, and best practice recommendations. A written personalized care plan for preventive services as well as general preventive health recommendations were provided to patient.     CClista Bernhardt [place]  01/09/2022   Ms. WKemple, Thank you for taking time to come for your Medicare Wellness Visit. I appreciate your ongoing commitment to your health goals. Please review the following plan we discussed and let me know if I can assist you in the future.   These are the goals we discussed:  Goals      Increase physical activity     Recommend physical activity for at least 30 minutes 3 times per day      Patient Stated     Patient states she would like to maintain health and activity level        This is a list of the screening recommended for you and due  dates:  Health Maintenance  Topic Date Due   Zoster (Shingles) Vaccine (1 of 2) Never done   COVID-19 Vaccine (6 - Moderna risk series) 02/04/2022   Mammogram  07/17/2022   Medicare Annual Wellness Visit  02/09/2023   Colon Cancer Screening  09/28/2023   Tetanus Vaccine  11/29/2031   Pneumonia Vaccine  Completed   Flu Shot  Completed   DEXA scan (bone density measurement)  Completed   Hepatitis C Screening: USPSTF Recommendation to screen - Ages 38-79 yo.  Completed   HPV Vaccine  Aged Out      Nurse Notes: None.

## 2022-02-04 ENCOUNTER — Ambulatory Visit: Payer: Medicare PPO | Admitting: Dermatology

## 2022-02-04 DIAGNOSIS — L578 Other skin changes due to chronic exposure to nonionizing radiation: Secondary | ICD-10-CM | POA: Diagnosis not present

## 2022-02-04 DIAGNOSIS — L814 Other melanin hyperpigmentation: Secondary | ICD-10-CM | POA: Diagnosis not present

## 2022-02-04 DIAGNOSIS — L719 Rosacea, unspecified: Secondary | ICD-10-CM

## 2022-02-04 DIAGNOSIS — Z1283 Encounter for screening for malignant neoplasm of skin: Secondary | ICD-10-CM | POA: Diagnosis not present

## 2022-02-04 DIAGNOSIS — D229 Melanocytic nevi, unspecified: Secondary | ICD-10-CM | POA: Diagnosis not present

## 2022-02-04 DIAGNOSIS — L280 Lichen simplex chronicus: Secondary | ICD-10-CM | POA: Diagnosis not present

## 2022-02-04 DIAGNOSIS — L821 Other seborrheic keratosis: Secondary | ICD-10-CM

## 2022-02-04 DIAGNOSIS — D485 Neoplasm of uncertain behavior of skin: Secondary | ICD-10-CM | POA: Diagnosis not present

## 2022-02-04 NOTE — Progress Notes (Signed)
Follow-Up Visit   Subjective  Diana Mcdaniel is a 71 y.o. female who presents for the following: Annual Exam. The patient presents for Total-Body Skin Exam (TBSE) for skin cancer screening and mole check.  The patient has spots, moles and lesions to be evaluated, some may be new or changing and the patient has concerns that these could be cancer.  The following portions of the chart were reviewed this encounter and updated as appropriate:   Tobacco  Allergies  Meds  Problems  Med Hx  Surg Hx  Fam Hx     Review of Systems:  No other skin or systemic complaints except as noted in HPI or Assessment and Plan.  Objective  Well appearing patient in no apparent distress; mood and affect are within normal limits.  A full examination was performed including scalp, head, eyes, ears, nose, lips, neck, chest, axillae, abdomen, back, buttocks, bilateral upper extremities, bilateral lower extremities, hands, feet, fingers, toes, fingernails, and toenails. All findings within normal limits unless otherwise noted below.  L post calf Pink patches on the legs and breasts.         Face Erythema of the face.  Crown/scalp Flesh colored papule.    Assessment & Plan  Neoplasm of uncertain behavior of skin L post calf  Skin / nail biopsy Type of biopsy: tangential   Informed consent: discussed and consent obtained   Timeout: patient name, date of birth, surgical site, and procedure verified   Procedure prep:  Patient was prepped and draped in usual sterile fashion Prep type:  Isopropyl alcohol Anesthesia: the lesion was anesthetized in a standard fashion   Anesthetic:  1% lidocaine w/ epinephrine 1-100,000 buffered w/ 8.4% NaHCO3 Instrument used: flexible razor blade   Hemostasis achieved with: pressure, aluminum chloride and electrodesiccation   Outcome: patient tolerated procedure well   Post-procedure details: sterile dressing applied and wound care instructions given    Dressing type: bandage and petrolatum    Specimen 1 - Surgical pathology Differential Diagnosis: eczema vs CTCL vs psoriasis vs other Check Margins: No  Rosacea Face Rosacea is a chronic progressive skin condition usually affecting the face of adults, causing redness and/or acne bumps. It is treatable but not curable. It sometimes affects the eyes (ocular rosacea) as well. It may respond to topical and/or systemic medication and can flare with stress, sun exposure, alcohol, exercise, topical steroids (including hydrocortisone/cortisone 10) and some foods.  Daily application of broad spectrum spf 30+ sunscreen to face is recommended to reduce flares.  Discussed the treatment option of BBL/laser.  Typically we recommend 1-3 treatment sessions about 5-8 weeks apart for best results.  The patient's condition may require "maintenance treatments" in the future.  The fee for BBL / laser treatments is $350 per treatment session for the whole face.  A fee can be quoted for other parts of the body. Insurance typically does not pay for BBL/laser treatments and therefore the fee is an out-of-pocket cost.  Nevus Crown/scalp Benign-appearing.  Observation.  Call clinic for new or changing moles.  Recommend daily use of broad spectrum spf 30+ sunscreen to sun-exposed areas.   Lentigines - Scattered tan macules - Due to sun exposure - Benign-appearing, observe - Recommend daily broad spectrum sunscreen SPF 30+ to sun-exposed areas, reapply every 2 hours as needed. - Call for any changes  Seborrheic Keratoses - Stuck-on, waxy, tan-brown papules and/or plaques  - Benign-appearing - Discussed benign etiology and prognosis. - Observe - Call for any changes  Melanocytic Nevi - Tan-brown and/or pink-flesh-colored symmetric macules and papules - Benign appearing on exam today - Observation - Call clinic for new or changing moles - Recommend daily use of broad spectrum spf 30+ sunscreen to sun-exposed  areas.   Hemangiomas - Red papules - Discussed benign nature - Observe - Call for any changes  Actinic Damage - Chronic condition, secondary to cumulative UV/sun exposure - diffuse scaly erythematous macules with underlying dyspigmentation - Recommend daily broad spectrum sunscreen SPF 30+ to sun-exposed areas, reapply every 2 hours as needed.  - Staying in the shade or wearing long sleeves, sun glasses (UVA+UVB protection) and wide brim hats (4-inch brim around the entire circumference of the hat) are also recommended for sun protection.  - Call for new or changing lesions.  Acrochordons (Skin Tags) - Fleshy, skin-colored pedunculated papules - Benign appearing.  - Observe. - If desired, they can be removed with an in office procedure that is not covered by insurance. - Please call the clinic if you notice any new or changing lesions.  Skin cancer screening performed today.  Return in about 1 year (around 02/05/2023) for TBSE.  Luther Redo, CMA, am acting as scribe for Sarina Ser, MD . Documentation: I have reviewed the above documentation for accuracy and completeness, and I agree with the above.  Sarina Ser, MD

## 2022-02-04 NOTE — Patient Instructions (Addendum)
Wound Care Instructions  Cleanse wound gently with soap and water once a day then pat dry with clean gauze. Apply a thin coat of Petrolatum (petroleum jelly, "Vaseline") over the wound (unless you have an allergy to this). We recommend that you use a new, sterile tube of Vaseline. Do not pick or remove scabs. Do not remove the yellow or white "healing tissue" from the base of the wound.  Cover the wound with fresh, clean, nonstick gauze and secure with paper tape. You may use Band-Aids in place of gauze and tape if the wound is small enough, but would recommend trimming much of the tape off as there is often too much. Sometimes Band-Aids can irritate the skin.  You should call the office for your biopsy report after 1 week if you have not already been contacted.  If you experience any problems, such as abnormal amounts of bleeding, swelling, significant bruising, significant pain, or evidence of infection, please call the office immediately.  FOR ADULT SURGERY PATIENTS: If you need something for pain relief you may take 1 extra strength Tylenol (acetaminophen) AND 2 Ibuprofen (200mg each) together every 4 hours as needed for pain. (do not take these if you are allergic to them or if you have a reason you should not take them.) Typically, you may only need pain medication for 1 to 3 days.     Due to recent changes in healthcare laws, you may see results of your pathology and/or laboratory studies on MyChart before the doctors have had a chance to review them. We understand that in some cases there may be results that are confusing or concerning to you. Please understand that not all results are received at the same time and often the doctors may need to interpret multiple results in order to provide you with the best plan of care or course of treatment. Therefore, we ask that you please give us 2 business days to thoroughly review all your results before contacting the office for clarification. Should  we see a critical lab result, you will be contacted sooner.   If You Need Anything After Your Visit  If you have any questions or concerns for your doctor, please call our main line at [phone] and press option 4 to reach your doctor's medical assistant. If no one answers, please leave a voicemail as directed and we will return your call as soon as possible. Messages left after 4 pm will be answered the following business day.   You may also send us a message via MyChart. We typically respond to MyChart messages within 1-2 business days.  For prescription refills, please ask your pharmacy to contact our office. Our fax number is [phone].  If you have an urgent issue when the clinic is closed that cannot wait until the next business day, you can page your doctor at the number below.    Please note that while we do our best to be available for urgent issues outside of office hours, we are not available 24/7.   If you have an urgent issue and are unable to reach us, you may choose to seek medical care at your doctor's office, retail clinic, urgent care center, or emergency room.  If you have a medical emergency, please immediately call 911 or go to the emergency department.  Pager Numbers  - Dr. Kowalski: [phone]  - Dr. Moye: [phone]  - Dr. Stewart: [phone]  In the event of inclement weather, please call our main line at   [phone] for an update on the status of any delays or closures.  Dermatology Medication Tips: Please keep the boxes that topical medications come in in order to help keep track of the instructions about where and how to use these. Pharmacies typically print the medication instructions only on the boxes and not directly on the medication tubes.   If your medication is too expensive, please contact our office at [phone] option 4 or send us a message through MyChart.   We are unable to tell what your co-pay for medications will be in  advance as this is different depending on your insurance coverage. However, we may be able to find a substitute medication at lower cost or fill out paperwork to get insurance to cover a needed medication.   If a prior authorization is required to get your medication covered by your insurance company, please allow us 1-2 business days to complete this process.  Drug prices often vary depending on where the prescription is filled and some pharmacies may offer cheaper prices.  The website www.goodrx.com contains coupons for medications through different pharmacies. The prices here do not account for what the cost may be with help from insurance (it may be cheaper with your insurance), but the website can give you the price if you did not use any insurance.  - You can print the associated coupon and take it with your prescription to the pharmacy.  - You may also stop by our office during regular business hours and pick up a GoodRx coupon card.  - If you need your prescription sent electronically to a different pharmacy, notify our office through [facility] MyChart or by phone at [phone] option 4.     Si Usted Necesita Algo Despus de Su Visita  Tambin puede enviarnos un mensaje a travs de MyChart. Por lo general respondemos a los mensajes de MyChart en el transcurso de 1 a 2 das hbiles.  Para renovar recetas, por favor pida a su farmacia que se ponga en contacto con nuestra oficina. Nuestro nmero de fax es el [phone].  Si tiene un asunto urgente cuando la clnica est cerrada y que no puede esperar hasta el siguiente da hbil, puede llamar/localizar a su doctor(a) al nmero que aparece a continuacin.   Por favor, tenga en cuenta que aunque hacemos todo lo posible para estar disponibles para asuntos urgentes fuera del horario de oficina, no estamos disponibles las 24 horas del da, los 7 das de la semana.   Si tiene un problema urgente y no puede comunicarse con nosotros, puede  optar por buscar atencin mdica  en el consultorio de su doctor(a), en una clnica privada, en un centro de atencin urgente o en una sala de emergencias.  Si tiene una emergencia mdica, por favor llame inmediatamente al 911 o vaya a la sala de emergencias.  Nmeros de bper  - Dr. Kowalski: [phone]  - Dra. Moye: [phone]  - Dra. Stewart: [phone]  En caso de inclemencias del tiempo, por favor llame a nuestra lnea principal al [phone] para una actualizacin sobre el estado de cualquier retraso o cierre.  Consejos para la medicacin en dermatologa: Por favor, guarde las cajas en las que vienen los medicamentos de uso tpico para ayudarle a seguir las instrucciones sobre dnde y cmo usarlos. Las farmacias generalmente imprimen las instrucciones del medicamento slo en las cajas y no directamente en los tubos del medicamento.   Si su medicamento es muy caro, por favor, pngase en contacto con   nuestra oficina llamando al [phone] y presione la opcin 4 o envenos un mensaje a travs de MyChart.   No podemos decirle cul ser su copago por los medicamentos por adelantado ya que esto es diferente dependiendo de la cobertura de su seguro. Sin embargo, es posible que podamos encontrar un medicamento sustituto a menor costo o llenar un formulario para que el seguro cubra el medicamento que se considera necesario.   Si se requiere una autorizacin previa para que su compaa de seguros cubra su medicamento, por favor permtanos de 1 a 2 das hbiles para completar este proceso.  Los precios de los medicamentos varan con frecuencia dependiendo del lugar de dnde se surte la receta y alguna farmacias pueden ofrecer precios ms baratos.  El sitio web www.goodrx.com tiene cupones para medicamentos de diferentes farmacias. Los precios aqu no tienen en cuenta lo que podra costar con la ayuda del seguro (puede ser ms barato con su seguro), pero el sitio web puede darle el  precio si no utiliz ningn seguro.  - Puede imprimir el cupn correspondiente y llevarlo con su receta a la farmacia.  - Tambin puede pasar por nuestra oficina durante el horario de atencin regular y recoger una tarjeta de cupones de GoodRx.  - Si necesita que su receta se enve electrnicamente a una farmacia diferente, informe a nuestra oficina a travs de MyChart de [facility] o por telfono llamando al [phone] y presione la opcin 4.  

## 2022-02-14 ENCOUNTER — Telehealth: Payer: Self-pay

## 2022-02-14 MED ORDER — MOMETASONE FUROATE 0.1 % EX CREA: 1.0000 | TOPICAL_CREAM | Freq: Two times a day (BID) | CUTANEOUS | 2 refills | Status: DC

## 2022-02-14 NOTE — Telephone Encounter (Signed)
Advised patient of pathology results/hd 

## 2022-02-14 NOTE — Telephone Encounter (Signed)
-----   Message from Ralene Bathe, MD sent at 02/12/2022  4:50 PM EST ----- Diagnosis Skin , left post calf LICHENOID SPONGIOTIC DERMATITIS, SEE DESCRIPTION  Rash most consistent with Eczema (but medication reaction is possibility) Start Mometasone cream bid 5 days per week disp 45 g with 2 rf and schedule appt for 2 mos recheck.

## 2022-02-21 ENCOUNTER — Encounter: Payer: Self-pay | Admitting: Dermatology

## 2022-04-11 ENCOUNTER — Ambulatory Visit: Payer: Medicare PPO | Admitting: Dermatology

## 2022-04-11 VITALS — BP 104/68 | HR 56

## 2022-04-11 DIAGNOSIS — Z79899 Other long term (current) drug therapy: Secondary | ICD-10-CM

## 2022-04-11 DIAGNOSIS — L309 Dermatitis, unspecified: Secondary | ICD-10-CM | POA: Diagnosis not present

## 2022-04-11 DIAGNOSIS — Z7189 Other specified counseling: Secondary | ICD-10-CM | POA: Diagnosis not present

## 2022-04-11 MED ORDER — MOMETASONE FUROATE 0.1 % EX CREA: 1.0000 | TOPICAL_CREAM | CUTANEOUS | 6 refills | Status: DC

## 2022-04-11 NOTE — Progress Notes (Signed)
   Follow-Up Visit   Subjective  Diana Mcdaniel is a 72 y.o. female who presents for the following: LICHENOID SPONGIOTIC DERMATITIS, bx proven (Bil legs, breast, 71mf/u, Mometasone cr bid 5d/wk, slight improvement, never itchy, pt has had for 3 yrs, has stayed the same over the 3 yrs, pt has been on Zocor for yrs with no trouble taking).  Patient accompanied by husband.  The following portions of the chart were reviewed this encounter and updated as appropriate:   Tobacco  Allergies  Meds  Problems  Med Hx  Surg Hx  Fam Hx     Review of Systems:  No other skin or systemic complaints except as noted in HPI or Assessment and Plan.  Objective  Well appearing patient in no apparent distress; mood and affect are within normal limits.  A focused examination was performed including bil legs. Relevant physical exam findings are noted in the Assessment and Plan.  bil legs, breast Pink patches bil lower legs, chest   Assessment & Plan  Eczema,  bil legs, breast  LICHENOID SPONGIOTIC DERMATITIS bx proven  Rash most consistent with Eczema (but medication reaction is possibility)   Decrease Mometasone to qd/bid 3d/wk Monday, Wednesday, Friday to aa legs, chest Discussed may repeat bx on f/u to r/o CTCL  Topical steroids (such as triamcinolone, fluocinolone, fluocinonide, mometasone, clobetasol, halobetasol, betamethasone, hydrocortisone) can cause thinning and lightening of the skin if they are used for too long in the same area. Your physician has selected the right strength medicine for your problem and area affected on the body. Please use your medication only as directed by your physician to prevent side effects.    mometasone (ELOCON) 0.1 % cream - bil legs, breast Apply 1 Application topically as directed. Apply to rash on legs and chest qd/bid 3 days a week Monday, Wednesday and Fridays  Return for as scheduled for TBSE, f/u Eczema.  I, SOthelia Pulling RMA, am acting as  scribe for DSarina Ser MD . Documentation: I have reviewed the above documentation for accuracy and completeness, and I agree with the above.  DSarina Ser MD

## 2022-04-11 NOTE — Patient Instructions (Addendum)
Decrease Mometasone to 1-2 times a day 3 days a week Monday, Wednesday, Friday to affected aeas legs, chest  Due to recent changes in healthcare laws, you may see results of your pathology and/or laboratory studies on MyChart before the doctors have had a chance to review them. We understand that in some cases there may be results that are confusing or concerning to you. Please understand that not all results are received at the same time and often the doctors may need to interpret multiple results in order to provide you with the best plan of care or course of treatment. Therefore, we ask that you please give [place] 2 business days to thoroughly review all your results before contacting the office for clarification. Should we see a critical lab result, you will be contacted sooner.   If You Need Anything After Your Visit  If you have any questions or concerns for your doctor, please call our main line at [phone] and press option 4 to reach your doctor's medical assistant. If no one answers, please leave a voicemail as directed and we will return your call as soon as possible. Messages left after 4 pm will be answered the following business day.   You may also send [place] a message via [place]. We typically respond to MyChart messages within 1-2 business days.  For prescription refills, please ask your pharmacy to contact our office. Our fax number is [phone].  If you have an urgent issue when the clinic is closed that cannot wait until the next business day, you can page your doctor at the number below.    Please note that while we do our best to be available for urgent issues outside of office hours, we are not available 24/7.   If you have an urgent issue and are unable to reach [place], you may choose to seek medical care at your doctor's office, retail clinic, urgent care center, or emergency room.  If you have a medical emergency, please immediately call 911 or go to the emergency  department.  Pager Numbers  - Dr. Nehemiah Massed: [phone]  - Dr. Laurence Ferrari: [phone]  - Dr. Nicole Kindred: [phone]  In the event of inclement weather, please call our main line at [phone] for an update on the status of any delays or closures.  Dermatology Medication Tips: Please keep the boxes that topical medications come in in order to help keep track of the instructions about where and how to use these. Pharmacies typically print the medication instructions only on the boxes and not directly on the medication tubes.   If your medication is too expensive, please contact our office at [phone] option 4 or send [place] a message through [place].   We are unable to tell what your co-pay for medications will be in advance as this is different depending on your insurance coverage. However, we may be able to find a substitute medication at lower cost or fill out paperwork to get insurance to cover a needed medication.   If a prior authorization is required to get your medication covered by your insurance company, please allow [place] 1-2 business days to complete this process.  Drug prices often vary depending on where the prescription is filled and some pharmacies may offer cheaper prices.  The website www.goodrx.com contains coupons for medications through different pharmacies. The prices here do not account for what the cost may be with help from insurance (it may be cheaper with your insurance), but the website can give you the  give you the price if you did not use any insurance.  - You can print the associated coupon and take it with your prescription to the pharmacy.  - You may also stop by our office during regular business hours and pick up a GoodRx coupon card.  - If you need your prescription sent electronically to a different pharmacy, notify our office through [facility] MyChart or by phone at [phone] option 4.     Si Usted Necesita Algo Despus de Su Visita  Tambin puede  enviarnos un mensaje a travs de MyChart. Por lo general respondemos a los mensajes de MyChart en el transcurso de 1 a 2 das hbiles.  Para renovar recetas, por favor pida a su farmacia que se ponga en contacto con nuestra oficina. Nuestro nmero de fax es el [phone].  Si tiene un asunto urgente cuando la clnica est cerrada y que no puede esperar hasta el siguiente da hbil, puede llamar/localizar a su doctor(a) al nmero que aparece a continuacin.   Por favor, tenga en cuenta que aunque hacemos todo lo posible para estar disponibles para asuntos urgentes fuera del horario de oficina, no estamos disponibles las 24 horas del da, los 7 das de la semana.   Si tiene un problema urgente y no puede comunicarse con nosotros, puede optar por buscar atencin mdica  en el consultorio de su doctor(a), en una clnica privada, en un centro de atencin urgente o en una sala de emergencias.  Si tiene una emergencia mdica, por favor llame inmediatamente al 911 o vaya a la sala de emergencias.  Nmeros de bper  - Dr. Kowalski: [phone]  - Dra. Moye: [phone]  - Dra. Stewart: [phone]  En caso de inclemencias del tiempo, por favor llame a nuestra lnea principal al [phone] para una actualizacin sobre el estado de cualquier retraso o cierre.  Consejos para la medicacin en dermatologa: Por favor, guarde las cajas en las que vienen los medicamentos de uso tpico para ayudarle a seguir las instrucciones sobre dnde y cmo usarlos. Las farmacias generalmente imprimen las instrucciones del medicamento slo en las cajas y no directamente en los tubos del medicamento.   Si su medicamento es muy caro, por favor, pngase en contacto con nuestra oficina llamando al [phone] y presione la opcin 4 o envenos un mensaje a travs de MyChart.   No podemos decirle cul ser su copago por los medicamentos por adelantado ya que esto es diferente dependiendo de la cobertura de su seguro.  Sin embargo, es posible que podamos encontrar un medicamento sustituto a menor costo o llenar un formulario para que el seguro cubra el medicamento que se considera necesario.   Si se requiere una autorizacin previa para que su compaa de seguros cubra su medicamento, por favor permtanos de 1 a 2 das hbiles para completar este proceso.  Los precios de los medicamentos varan con frecuencia dependiendo del lugar de dnde se surte la receta y alguna farmacias pueden ofrecer precios ms baratos.  El sitio web www.goodrx.com tiene cupones para medicamentos de diferentes farmacias. Los precios aqu no tienen en cuenta lo que podra costar con la ayuda del seguro (puede ser ms barato con su seguro), pero el sitio web puede darle el precio si no utiliz ningn seguro.  - Puede imprimir el cupn correspondiente y llevarlo con su receta a la farmacia.  - Tambin puede pasar por nuestra oficina durante el horario de atencin regular y recoger una tarjeta de cupones de GoodRx.  -   su receta se enve electrnicamente a una farmacia diferente, informe a nuestra oficina a travs de MyChart de [facility] o por telfono llamando al [phone] y presione la opcin 4.

## 2022-04-16 ENCOUNTER — Other Ambulatory Visit: Payer: Self-pay | Admitting: Internal Medicine

## 2022-04-16 DIAGNOSIS — F410 Panic disorder [episodic paroxysmal anxiety] without agoraphobia: Secondary | ICD-10-CM

## 2022-04-17 NOTE — Telephone Encounter (Signed)
Requested medication (s) are due for refill today: yes  Requested medication (s) are on the active medication list: yes    Last refill: 11/06/21  #20  2 refills  Future visit scheduled yes 05/13/22  Notes to clinic:Not delegated, please review. Thank you.  Requested Prescriptions  Pending Prescriptions Disp Refills   clonazePAM (KLONOPIN) 0.5 MG tablet [Pharmacy Med Name: CLONAZEPAM 0.5 MG TABLET] 20 tablet 2    Sig: TAKE 1 TABLET BY MOUTH TWICE A DAY AS NEEDED FOR ANXIETY     Not Delegated - Psychiatry: Anxiolytics/Hypnotics 2 Failed - 04/16/2022  5:54 PM      Failed - This refill cannot be delegated      Failed - Urine Drug Screen completed in last 360 days      Passed - Patient is not pregnant      Passed - Valid encounter within last 6 months    Recent Outpatient Visits           5 months ago Essential hypertension   [place] at [facility], Jesse Sans, MD   11 months ago Annual physical exam   [facility] at [facility], Jesse Sans, MD   1 year ago Laryngitis, acute   [place] [facility] at [facility], Jesse Sans, MD   1 year ago Essential hypertension   [place] at [facility] Jesse Sans, MD   1 year ago Annual physical exam   [place] at [facility], Jesse Sans, MD       Future Appointments             In 3 weeks Army Melia Jesse Sans, MD [place] at [facility]   In 9 months Ralene Bathe, [place]

## 2022-04-17 NOTE — Telephone Encounter (Signed)
Please advise 

## 2022-04-19 ENCOUNTER — Encounter: Payer: Self-pay | Admitting: Dermatology

## 2022-05-04 ENCOUNTER — Other Ambulatory Visit: Payer: Self-pay | Admitting: Internal Medicine

## 2022-05-04 DIAGNOSIS — I10 Essential (primary) hypertension: Secondary | ICD-10-CM

## 2022-05-04 DIAGNOSIS — E782 Mixed hyperlipidemia: Secondary | ICD-10-CM

## 2022-05-10 IMAGING — MG MM DIGITAL SCREENING BILAT W/ TOMO AND CAD
8 series · 8 of 24 positions shown · non-contrast
Comparison: Previous exam(s).

CLINICAL DATA: Screening.

EXAM:
DIGITAL SCREENING BILATERAL MAMMOGRAM WITH TOMOSYNTHESIS AND CAD
TECHNIQUE: Bilateral screening digital craniocaudal and mediolateral oblique
mammograms were obtained. Bilateral screening digital breast
tomosynthesis was performed. The images were evaluated with
computer-aided detection.

[L MLO synth-2D]
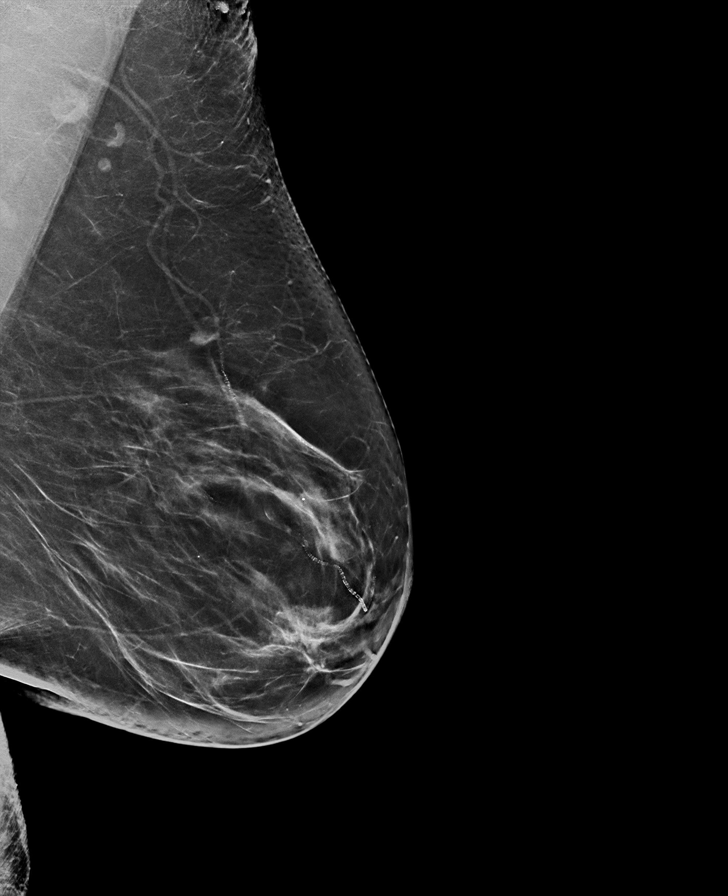

[L CC synth-2D]
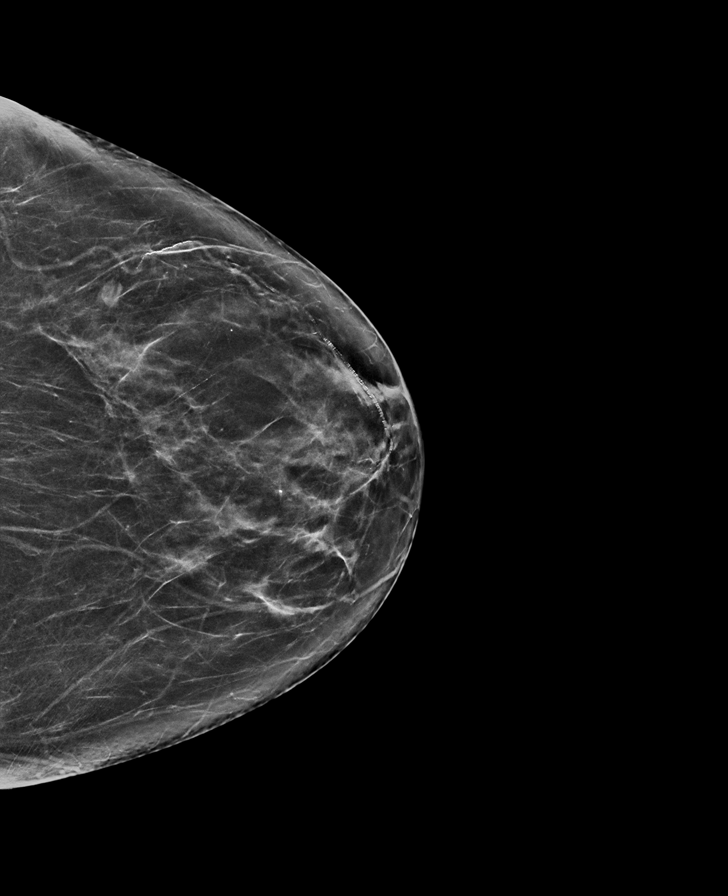

[R CC synth-2D]
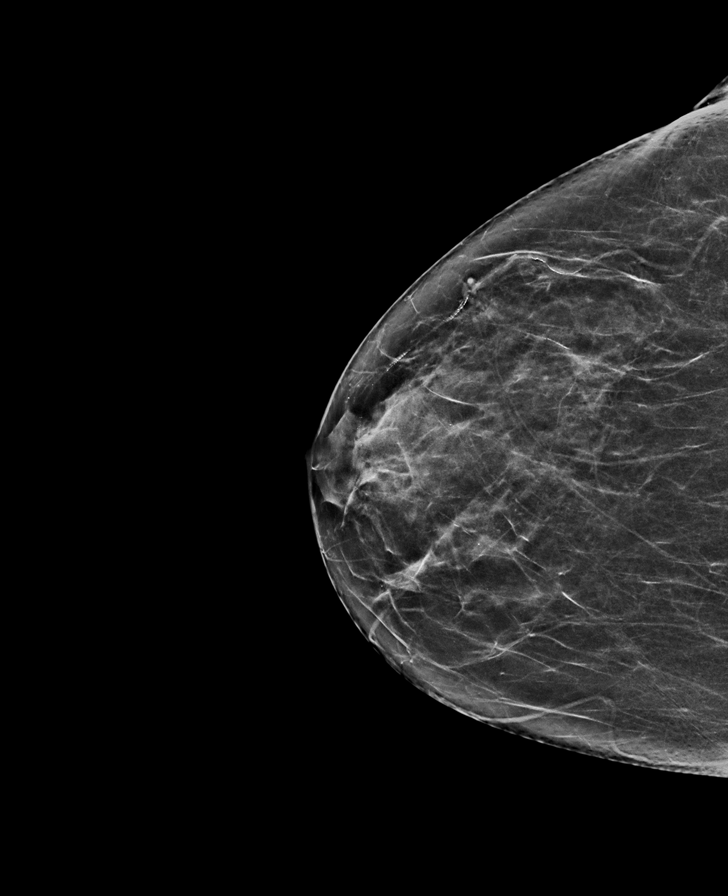

[R MLO synth-2D]
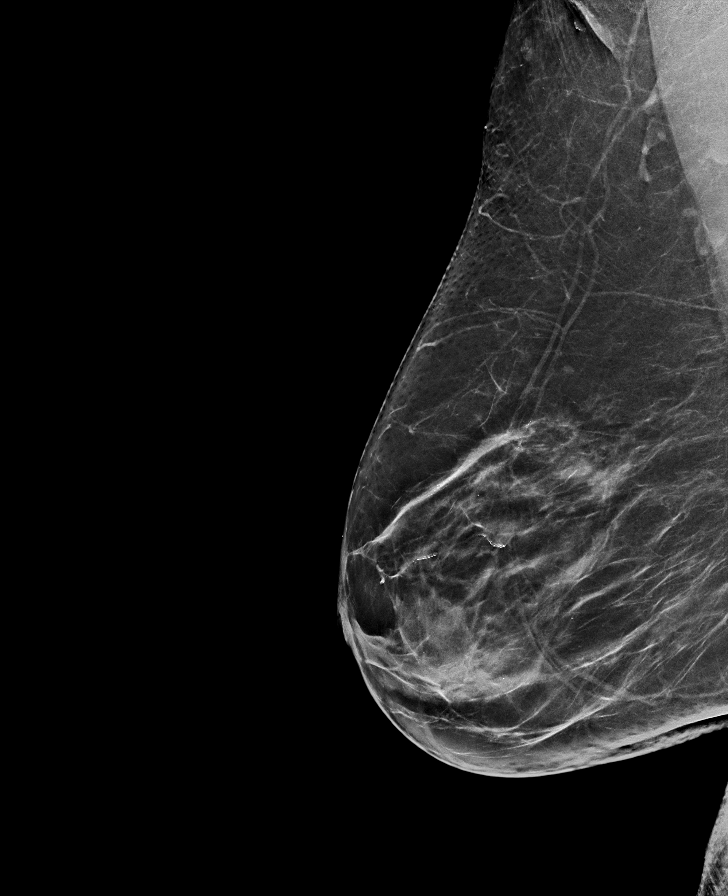

[R MLO tomo · tomo slice 38/75.0]
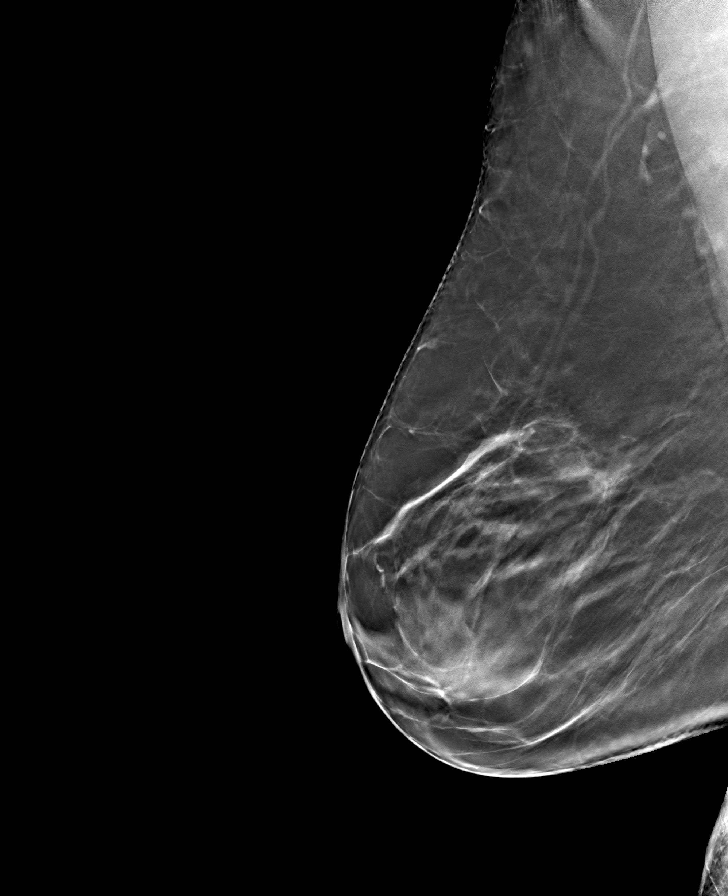

[L MLO tomo · tomo slice 41/81.0]
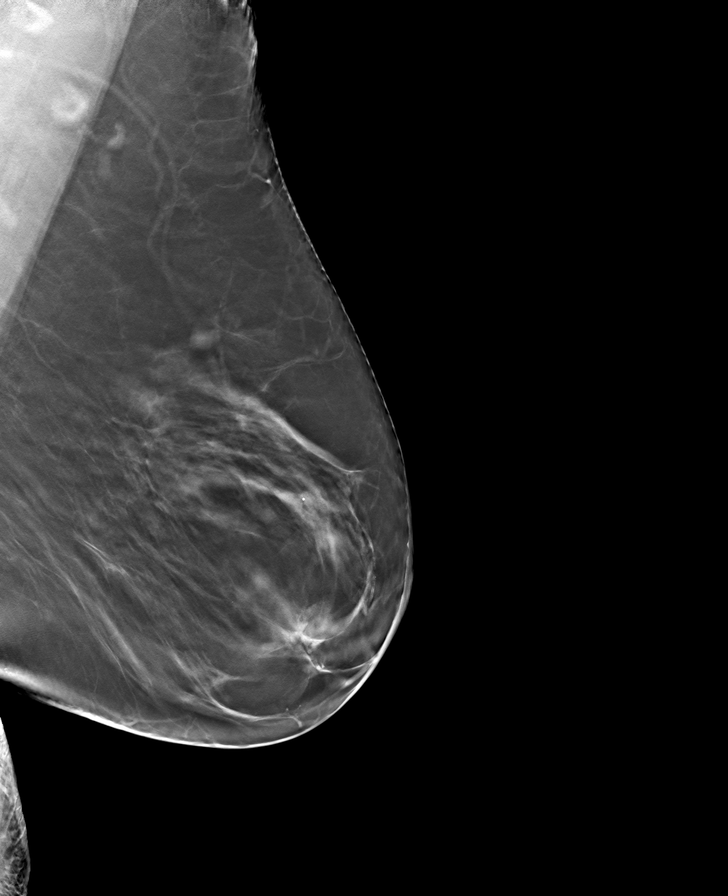

[R CC tomo · tomo slice 33/65.0]
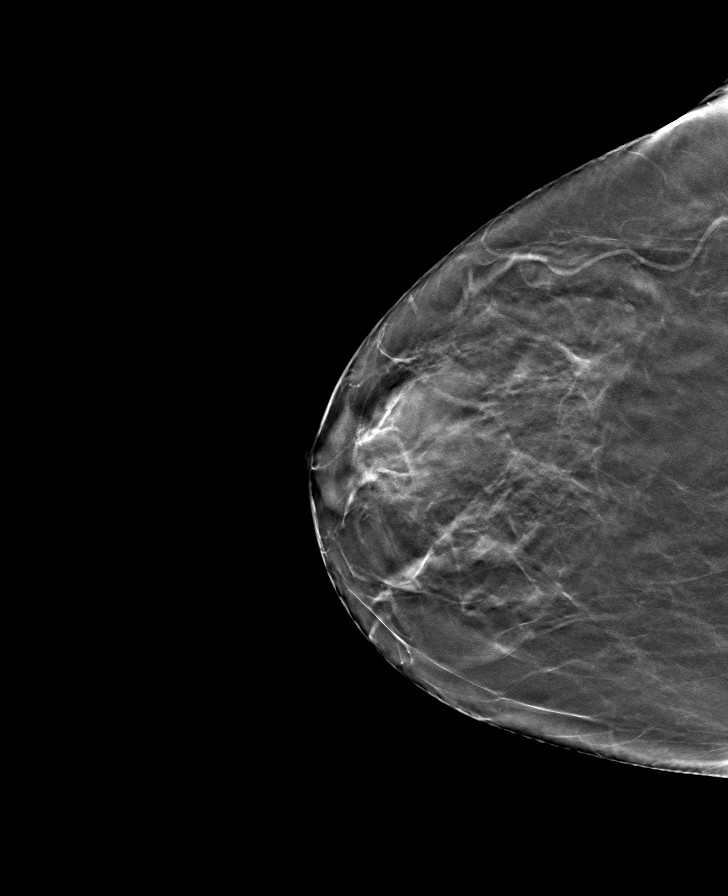

[L CC tomo · tomo slice 36/71.0]
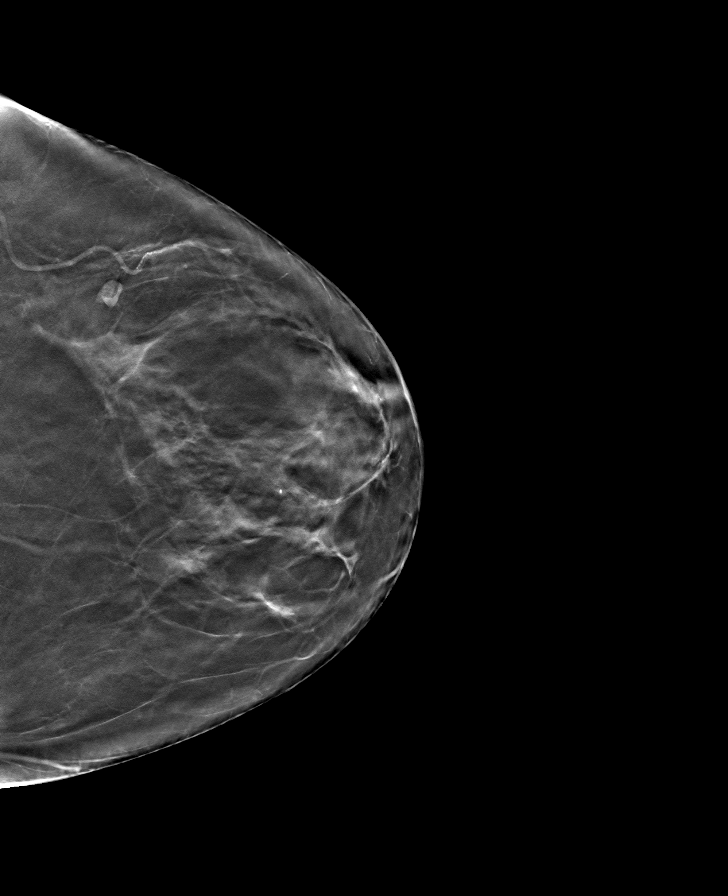

[8 of 24 positions shown; findings below may reference images not displayed]

ACR Breast Density Category b: There are scattered areas of
fibroglandular density.
FINDINGS: There are no findings suspicious for malignancy.
IMPRESSION: No mammographic evidence of malignancy. A result letter of this
screening mammogram will be mailed directly to the patient.

RECOMMENDATION:
Screening mammogram in one year. (Code:51-O-LD2)

BI-RADS CATEGORY  1: Negative.

## 2022-05-11 ENCOUNTER — Other Ambulatory Visit: Payer: Self-pay | Admitting: Internal Medicine

## 2022-05-11 DIAGNOSIS — I10 Essential (primary) hypertension: Secondary | ICD-10-CM

## 2022-05-13 ENCOUNTER — Encounter: Payer: Self-pay | Admitting: Internal Medicine

## 2022-05-13 ENCOUNTER — Ambulatory Visit (INDEPENDENT_AMBULATORY_CARE_PROVIDER_SITE_OTHER): Payer: Medicare PPO | Admitting: Internal Medicine

## 2022-05-13 VITALS — BP 122/78 | HR 54 | Ht 68.0 in | Wt 209.0 lb

## 2022-05-13 DIAGNOSIS — M8580 Other specified disorders of bone density and structure, unspecified site: Secondary | ICD-10-CM

## 2022-05-13 DIAGNOSIS — I10 Essential (primary) hypertension: Secondary | ICD-10-CM

## 2022-05-13 DIAGNOSIS — G250 Essential tremor: Secondary | ICD-10-CM

## 2022-05-13 DIAGNOSIS — F324 Major depressive disorder, single episode, in partial remission: Secondary | ICD-10-CM

## 2022-05-13 DIAGNOSIS — Z0000 Encounter for general adult medical examination without abnormal findings: Secondary | ICD-10-CM | POA: Diagnosis not present

## 2022-05-13 DIAGNOSIS — E782 Mixed hyperlipidemia: Secondary | ICD-10-CM | POA: Diagnosis not present

## 2022-05-13 DIAGNOSIS — D860 Sarcoidosis of lung: Secondary | ICD-10-CM

## 2022-05-13 DIAGNOSIS — Z1231 Encounter for screening mammogram for malignant neoplasm of breast: Secondary | ICD-10-CM

## 2022-05-13 MED ORDER — FLUOXETINE HCL 20 MG PO CAPS: ORAL_CAPSULE | ORAL | 3 refills | Status: DC

## 2022-05-13 NOTE — Assessment & Plan Note (Signed)
Tolerating statin medications without concerns LDL is  Lab Results  Component Value Date   LDLCALC 114 (H) 05/08/2021  with a goal of < 100 Current dose will be adjusted if needed.

## 2022-05-13 NOTE — Assessment & Plan Note (Signed)
Clinically stable on current regimen with good control of symptoms, No SI or HI. No change in management at this time - Prozac daily and PRN clonazepam

## 2022-05-13 NOTE — Progress Notes (Signed)
Date:  05/13/2022   Name:  Diana Mcdaniel   DOB:  Dec 22, 1950   MRN:  [id number]   Chief Complaint: Annual Exam Diana Mcdaniel is a 72 y.o. female who presents today for her Complete Annual Exam. She feels well. She reports exercising none. She reports she is sleeping well. Breast complaints none.  Mammogram: 07/2021 DEXA: 06/2020 Normal Colonoscopy: 09/2018 repeat 5 yrs  Health Maintenance Due  Topic Date Due   COVID-19 Vaccine (6 - 2023-24 season) 02/04/2022    Immunization History  Administered Date(s) Administered   Fluad Quad(high Dose 65+) 11/25/2018, 12/03/2019   Influenza, High Dose Seasonal PF 01/07/2018, 12/26/2020, 11/28/2021   Moderna SARS-COV2 Booster Vaccination 11/24/2020   Moderna Sars-Covid-2 Vaccination 04/30/2019, 05/29/2019, 01/07/2020, 06/23/2020, 11/24/2020   PNEUMOCOCCAL CONJUGATE-20 05/08/2021   Pneumococcal Polysaccharide-23 05/04/2020   Rsv, Bivalent, Protein Subunit Rsvpref,pf Diana Lance) 11/28/2021   Tdap 11/28/2021   Unspecified SARS-COV-2 Vaccination 12/10/2021    Hypertension This is a chronic problem. The problem is controlled. Pertinent negatives include no chest pain, headaches, palpitations or shortness of breath. Past treatments include beta blockers and angiotensin blockers. The current treatment provides significant improvement. There is no history of kidney disease, CAD/MI or CVA.  Hyperlipidemia This is a chronic problem. The problem is controlled. Pertinent negatives include no chest pain or shortness of breath. Current antihyperlipidemic treatment includes statins. The current treatment provides moderate improvement of lipids.  Tremor - benign essential tremor.  She does not feel that it is worse.  Medications deferred for now.   Lab Results  Component Value Date   NA 137 05/08/2021   K 4.7 05/08/2021   CO2 26 05/08/2021   GLUCOSE 100 (H) 05/08/2021   BUN 10 05/08/2021   CREATININE 0.84 05/08/2021   CALCIUM 9.8 05/08/2021    EGFR 74 05/08/2021   GFRNONAA 69 05/04/2020   Lab Results  Component Value Date   CHOL 205 (H) 05/08/2021   HDL 71 05/08/2021   LDLCALC 114 (H) 05/08/2021   TRIG 114 05/08/2021   CHOLHDL 2.9 05/08/2021   Lab Results  Component Value Date   TSH 3.720 05/08/2021   Lab Results  Component Value Date   HGBA1C 5.5 05/04/2020   Lab Results  Component Value Date   WBC 4.3 05/08/2021   HGB 13.3 05/08/2021   HCT 39.3 05/08/2021   MCV 86 05/08/2021   PLT 293 05/08/2021   Lab Results  Component Value Date   ALT 13 05/08/2021   AST 20 05/08/2021   ALKPHOS 87 05/08/2021   BILITOT 0.7 05/08/2021   Lab Results  Component Value Date   VD25OH 41.8 05/04/2020     Review of Systems  Constitutional:  Negative for chills, fatigue and fever.  HENT:  Negative for congestion, hearing loss, tinnitus, trouble swallowing and voice change.   Eyes:  Negative for visual disturbance.  Respiratory:  Negative for cough, chest tightness, shortness of breath and wheezing.   Cardiovascular:  Negative for chest pain, palpitations and leg swelling.  Gastrointestinal:  Negative for abdominal pain, constipation, diarrhea and vomiting.  Endocrine: Negative for polydipsia and polyuria.  Genitourinary:  Negative for dysuria, frequency, genital sores, vaginal bleeding and vaginal discharge.  Musculoskeletal:  Negative for arthralgias, gait problem and joint swelling.  Skin:  Negative for color change and rash.  Neurological:  Positive for tremors. Negative for dizziness, light-headedness and headaches.  Hematological:  Negative for adenopathy. Does not bruise/bleed easily.  Psychiatric/Behavioral:  Negative for dysphoric mood and sleep  disturbance. The patient is not nervous/anxious.     Patient Active Problem List   Diagnosis Date Noted   Primary insomnia 05/03/2019   Family history of colon cancer in father    Polyp of sigmoid colon    Osteopenia determined by x-ray 05/26/2018   Sarcoidosis of lung  ([place]) 04/29/2018   Migraine headache with aura 04/29/2018   Tremor, essential 04/29/2018   Panic disorder 04/29/2018   Essential hypertension 12/01/2017   Mixed hyperlipidemia 12/01/2017   Ganglion cyst of foot 12/01/2017   Major depressive disorder with single episode, in partial remission ([place]) 12/01/2017   Primary osteoarthritis of left hip 12/01/2017    No Known Allergies  Past Surgical History:  Procedure Laterality Date   CARDIAC CATHETERIZATION  2010   Normal   COLONOSCOPY WITH PROPOFOL N/A 09/28/2018   Procedure: COLONOSCOPY WITH  biopsy;  Surgeon: Lucilla Lame, MD;  Location: [facility];  Service: Endoscopy;  Laterality: N/A;   POLYPECTOMY  09/28/2018   Procedure: POLYPECTOMY;  Surgeon: Lucilla Lame, MD;  Location: [facility];  Service: Endoscopy;;    Social History   Tobacco Use   Smoking status: Former    Packs/day: 1.00    Years: 15.00    Total pack years: 15.00    Types: Cigarettes    Quit date: 03/18/1993    Years since quitting: 29.1   Smokeless tobacco: Never  Vaping Use   Vaping Use: Never used  Substance Use Topics   Alcohol use: Yes    Alcohol/week: 24.0 standard drinks of alcohol    Types: 24 Glasses of wine per week    Comment: 3-4 glasses wine/day   Drug use: Never     Medication list has been reviewed and updated.  Current Meds  Medication Sig   albuterol (VENTOLIN HFA) 108 (90 Base) MCG/ACT inhaler Inhale 2 puffs into the lungs every 6 (six) hours as needed for wheezing or shortness of breath.   atenolol (TENORMIN) 25 MG tablet TAKE 1 TABLET (25 MG TOTAL) BY MOUTH DAILY.   Calcium-Magnesium-Vitamin D (CALCIUM 1200+D3 PO) Take by mouth daily.   clonazePAM (KLONOPIN) 0.5 MG tablet TAKE 1 TABLET BY MOUTH TWICE A DAY AS NEEDED FOR ANXIETY   losartan (COZAAR) 25 MG tablet TAKE 1 TABLET (25 MG TOTAL) BY MOUTH DAILY.   mometasone (ELOCON) 0.1 % cream APPLY 1 APPLICATION TOPICALLY DAILY AS NEEDED (RASH).   mometasone (ELOCON) 0.1 %  cream Apply 1 Application topically 2 (two) times daily. Up to 5 days per week prn rash   mometasone (ELOCON) 0.1 % cream Apply 1 Application topically as directed. Apply to rash on legs and chest qd/bid 3 days a week Monday, Wednesday and Fridays   mometasone (ELOCON) 0.1 % lotion Apply topically daily. Apply 1 drop daily 5 days weekly to affected nails   simvastatin (ZOCOR) 20 MG tablet TAKE 1 TABLET BY MOUTH EVERY DAY   [DISCONTINUED] FLUoxetine (PROZAC) 20 MG capsule TAKE 1 CAPSULE BY MOUTH EVERY DAY       05/13/2022   10:39 AM 11/05/2021    9:37 AM 05/08/2021   10:00 AM 02/19/2021   10:36 AM  GAD 7 : Generalized Anxiety Score  Nervous, Anxious, on Edge 0 2 0 0  Control/stop worrying 0 0 0 0  Worry too much - different things 0 0 0 0  Trouble relaxing 0 0 0 0  Restless 0 0 0 0  Easily annoyed or irritable 0 0 0 0  Afraid - awful  might happen 0 0 0 0  Total GAD 7 Score 0 2 0 0  Anxiety Difficulty Not difficult at all Not difficult at all  Not difficult at all       05/13/2022   10:39 AM 01/09/2022    2:23 PM 11/05/2021    9:37 AM  Depression screen PHQ 2/9  Decreased Interest 0 0 0  Down, Depressed, Hopeless 0 0 0  PHQ - 2 Score 0 0 0  Altered sleeping 0 0 0  Tired, decreased energy 0 0 0  Change in appetite 0 0 0  Feeling bad or failure about yourself  0 0 0  Trouble concentrating 0 0 0  Moving slowly or fidgety/restless 0 0 0  Suicidal thoughts 0 0 0  PHQ-9 Score 0 0 0  Difficult doing work/chores Not difficult at all Not difficult at all Not difficult at all    BP Readings from Last 3 Encounters:  05/13/22 122/78  04/11/22 104/68  11/05/21 122/68    Physical Exam Vitals and nursing note reviewed.  Constitutional:      General: She is not in acute distress.    Appearance: She is well-developed.  HENT:     Head: Normocephalic and atraumatic.     Right Ear: Tympanic membrane and ear canal normal.     Left Ear: Tympanic membrane and ear canal normal.     Nose:      Right Sinus: No maxillary sinus tenderness.     Left Sinus: No maxillary sinus tenderness.  Eyes:     General: No scleral icterus.       Right eye: No discharge.        Left eye: No discharge.     Conjunctiva/sclera: Conjunctivae normal.  Neck:     Thyroid: No thyromegaly.     Vascular: No carotid bruit.  Cardiovascular:     Rate and Rhythm: Normal rate and regular rhythm.     Pulses: Normal pulses.     Heart sounds: Normal heart sounds.  Pulmonary:     Effort: Pulmonary effort is normal. No respiratory distress.     Breath sounds: No wheezing.  Chest:  Breasts:    Right: No mass, nipple discharge, skin change or tenderness.     Left: No mass, nipple discharge, skin change or tenderness.  Abdominal:     General: Bowel sounds are normal.     Palpations: Abdomen is soft.     Tenderness: There is no abdominal tenderness.  Musculoskeletal:     Cervical back: Normal range of motion. No erythema.     Right lower leg: No edema.     Left lower leg: No edema.  Lymphadenopathy:     Cervical: No cervical adenopathy.  Skin:    General: Skin is warm and dry.     Capillary Refill: Capillary refill takes less than 2 seconds.     Findings: No rash.     Comments: Color change of skin of both breasts and lower legs   Neurological:     General: No focal deficit present.     Mental Status: She is alert and oriented to person, place, and time.     Cranial Nerves: No cranial nerve deficit.     Sensory: No sensory deficit.     Motor: Tremor (of head) present.     Deep Tendon Reflexes: Reflexes are normal and symmetric.  Psychiatric:        Attention and Perception: Attention normal.  Mood and Affect: Mood normal.     Wt Readings from Last 3 Encounters:  05/13/22 209 lb (94.8 kg)  01/09/22 204 lb 12.8 oz (92.9 kg)  11/05/21 204 lb 12.8 oz (92.9 kg)    BP 122/78   Pulse (!) 54   Ht '5\' 8"'$  (1.727 m)   Wt 209 lb (94.8 kg)   SpO2 96%   BMI 31.78 kg/m   Assessment and  Plan: Problem List Items Addressed This Visit       Cardiovascular and Mediastinum   Essential hypertension (Chronic)    Clinically stable exam with well controlled BP on atenolol and losartan. Tolerating medications without side effects. Pt to continue current regimen and low sodium diet.       Relevant Orders   CBC with Differential/Platelet   Comprehensive metabolic panel     Respiratory   Sarcoidosis of lung (HCC) (Chronic)    Felt to be in remission Last CXR read as sequelae of sarcoidosis        Nervous and Auditory   Tremor, essential (Chronic)    Benign tremor unchanged Pt declines medication at this time.        Musculoskeletal and Integument   Osteopenia determined by x-ray (Chronic)    Normal bone density on DEXA  06/2020 Continue calcium and vitamin D Recommend weight bearing or other exercise as tolerated        Other   Major depressive disorder with single episode, in partial remission (HCC) (Chronic)    Clinically stable on current regimen with good control of symptoms, No SI or HI. No change in management at this time - Prozac daily and PRN clonazepam      Relevant Medications   FLUoxetine (PROZAC) 20 MG capsule   Other Relevant Orders   TSH   Mixed hyperlipidemia (Chronic)    Tolerating statin medications without concerns LDL is  Lab Results  Component Value Date   LDLCALC 114 (H) 05/08/2021  with a goal of < 100 Current dose will be adjusted if needed.       Relevant Orders   Lipid panel   Other Visit Diagnoses     Annual physical exam    -  Primary   up to date on all recommended immunizations and screenings   Encounter for screening mammogram for breast cancer       schedule at [facility]   Relevant Orders   MM 3D [place]        Partially dictated using Editor, commissioning. Any errors are unintentional.  Halina Maidens, MD [facility]  05/13/2022

## 2022-05-13 NOTE — Assessment & Plan Note (Signed)
Clinically stable exam with well controlled BP on atenolol and losartan. Tolerating medications without side effects. Pt to continue current regimen and low sodium diet.

## 2022-05-13 NOTE — Assessment & Plan Note (Signed)
Benign tremor unchanged Pt declines medication at this time.

## 2022-05-13 NOTE — Patient Instructions (Signed)
Call [facility] to schedule your mammogram at [phone].  

## 2022-05-13 NOTE — Assessment & Plan Note (Signed)
Felt to be in remission Last CXR read as sequelae of sarcoidosis

## 2022-05-13 NOTE — Assessment & Plan Note (Addendum)
Normal bone density on DEXA  06/2020 Continue calcium and vitamin D Recommend weight bearing or other exercise as tolerated

## 2022-05-14 LAB — CBC WITH DIFFERENTIAL/PLATELET
Basophils Absolute: 0 10*3/uL (ref 0.0–0.2)
Basos: 1 %
EOS (ABSOLUTE): 0.1 10*3/uL (ref 0.0–0.4)
Eos: 2 %
Hematocrit: 38 % (ref 34.0–46.6)
Hemoglobin: 13 g/dL (ref 11.1–15.9)
Immature Grans (Abs): 0 10*3/uL (ref 0.0–0.1)
Immature Granulocytes: 0 %
Lymphocytes Absolute: 0.7 10*3/uL (ref 0.7–3.1)
Lymphs: 15 %
MCH: 29.3 pg (ref 26.6–33.0)
MCHC: 34.2 g/dL (ref 31.5–35.7)
MCV: 86 fL (ref 79–97)
Monocytes Absolute: 0.6 10*3/uL (ref 0.1–0.9)
Monocytes: 12 %
Neutrophils Absolute: 3.6 10*3/uL (ref 1.4–7.0)
Neutrophils: 70 %
Platelets: 291 10*3/uL (ref 150–450)
RBC: 4.43 x10E6/uL (ref 3.77–5.28)
RDW: 11.5 % — ABNORMAL LOW (ref 11.7–15.4)
WBC: 5.1 10*3/uL (ref 3.4–10.8)

## 2022-05-14 LAB — LIPID PANEL
Chol/HDL Ratio: 2.9 ratio (ref 0.0–4.4)
Cholesterol, Total: 197 mg/dL (ref 100–199)
HDL: 69 mg/dL (ref >39)
LDL Chol Calc (NIH): 107 mg/dL — ABNORMAL HIGH (ref 0–99)
Triglycerides: 122 mg/dL (ref 0–149)
VLDL Cholesterol Cal: 21 mg/dL (ref 5–40)

## 2022-05-14 LAB — COMPREHENSIVE METABOLIC PANEL
ALT: 15 IU/L (ref 0–32)
AST: 20 IU/L (ref 0–40)
Albumin/Globulin Ratio: 1.6 (ref 1.2–2.2)
Albumin: 4.3 g/dL (ref 3.8–4.8)
Alkaline Phosphatase: 86 IU/L (ref 44–121)
BUN/Creatinine Ratio: 13 (ref 12–28)
BUN: 11 mg/dL (ref 8–27)
Bilirubin Total: 0.6 mg/dL (ref 0.0–1.2)
CO2: 21 mmol/L (ref 20–29)
Calcium: 9.9 mg/dL (ref 8.7–10.3)
Chloride: 97 mmol/L (ref 96–106)
Creatinine, Ser: 0.83 mg/dL (ref 0.57–1.00)
Globulin, Total: 2.7 g/dL (ref 1.5–4.5)
Glucose: 105 mg/dL — ABNORMAL HIGH (ref 70–99)
Potassium: 4.6 mmol/L (ref 3.5–5.2)
Sodium: 135 mmol/L (ref 134–144)
Total Protein: 7 g/dL (ref 6.0–8.5)
eGFR: 74 mL/min/{1.73_m2} (ref >59)

## 2022-05-14 LAB — TSH: TSH: 3.39 u[IU]/mL (ref 0.450–4.500)

## 2022-07-18 ENCOUNTER — Ambulatory Visit
Admission: RE | Admit: 2022-07-18 | Discharge: 2022-07-18 | Disposition: A | Discharge status: Home / Self Care | Payer: Medicare PPO | Source: Ambulatory Visit | Attending: Internal Medicine | Admitting: Internal Medicine

## 2022-07-18 DIAGNOSIS — Z1231 Encounter for screening mammogram for malignant neoplasm of breast: Secondary | ICD-10-CM | POA: Insufficient documentation

## 2022-07-29 ENCOUNTER — Other Ambulatory Visit: Payer: Self-pay | Admitting: Internal Medicine

## 2022-07-29 DIAGNOSIS — F410 Panic disorder [episodic paroxysmal anxiety] without agoraphobia: Secondary | ICD-10-CM

## 2022-07-30 NOTE — Telephone Encounter (Signed)
Please review.  KP

## 2022-07-30 NOTE — Telephone Encounter (Signed)
Requested medication (s) are due for refill today: Yes  Requested medication (s) are on the active medication list: Yes  Last refill:  04/17/22  Future visit scheduled: Yes  Notes to clinic:  See request.    Requested Prescriptions  Pending Prescriptions Disp Refills   clonazePAM (KLONOPIN) 0.5 MG tablet [Pharmacy Med Name: CLONAZEPAM 0.5 MG TABLET] 20 tablet 2    Sig: TAKE 1 TABLET BY MOUTH TWICE A DAY AS NEEDED FOR ANXIETY     Not Delegated - Psychiatry: Anxiolytics/Hypnotics 2 Failed - 07/29/2022  4:53 PM      Failed - This refill cannot be delegated      Failed - Urine Drug Screen completed in last 360 days      Passed - Patient is not pregnant      Passed - Valid encounter within last 6 months    Recent Outpatient Visits           2 months ago Annual physical exam   [place] [facility] at [facility], Nyoka Cowden, MD   8 months ago Essential hypertension   [place] [facility] at [facility], Nyoka Cowden, MD   1 year ago Annual physical exam   [facility] at [facility], Nyoka Cowden, MD   1 year ago Laryngitis, acute   [facility] at [facility], Nyoka Cowden, MD   1 year ago Essential hypertension   [place] [facility] at [facility], Nyoka Cowden, MD       Future Appointments             In 3 months Judithann Graves, Nyoka Cowden, MD [facility] at [facility]   In 6 months Deirdre Evener, MD [facility]

## 2022-11-11 ENCOUNTER — Ambulatory Visit: Payer: Self-pay | Admitting: *Deleted

## 2022-11-11 NOTE — Telephone Encounter (Signed)
  Chief Complaint: Tingling Symptoms: "Tingling" of right arm, begins at shoulder, down to thumb and index finger. Comes and goes "Lasts about a second."  No numbness, no injury Frequency: 1 week Pertinent Negatives: Patient denies Injury, numbness, neck, back pain, headache. Disposition: [] ED /[] Urgent Care (no appt availability in office) / [x] Appointment(In office/virtual)/ []  [place] [facility]/ [] Home Care/ [] Refused Recommended Disposition /[] [place] [facility]/ []  Follow-up with PCP Additional Notes: Appt secured for tomorrow. Care advise provided, worsening symptoms go to ED. Pt verbalizes understanding.  Reason for Disposition  [1] Tingling (e.g., pins and needles) of the face, arm / hand, or leg / foot on one side of the body AND [2] present now (Exceptions: Chronic or recurrent symptom lasting > 4 weeks; or tingling from known cause, such as: bumped elbow, carpal tunnel syndrome, pinched nerve, frostbite.)    Comes and goes x 1 week  Answer Assessment - Initial Assessment Questions 1. SYMPTOM: "What is the main symptom you are concerned about?" (e.g., weakness, numbness)     Tingling 2. ONSET: "When did this start?" (minutes, hours, days; while sleeping)     1 week ago 3. LAST NORMAL: "When was the last time you (the patient) were normal (no symptoms)?"     1 week ago 4. PATTERN "Does this come and go, or has it been constant since it started?"  "Is it present now?"     Comes and goes 5. CARDIAC SYMPTOMS: "Have you had any of the following symptoms: chest pain, difficulty breathing, palpitations?"     no 6. NEUROLOGIC SYMPTOMS: "Have you had any of the following symptoms: headache, dizziness, vision loss, double vision, changes in speech, unsteady on your feet?"     Shoulder tingling down to thumb and index finger. 7. OTHER SYMPTOMS: "Do you have any other symptoms?"     No  Protocols used: Neurologic Deficit-A-AH

## 2022-11-12 ENCOUNTER — Ambulatory Visit: Payer: Medicare PPO | Admitting: Physician Assistant

## 2022-11-12 ENCOUNTER — Encounter: Payer: Self-pay | Admitting: Physician Assistant

## 2022-11-12 VITALS — BP 136/80 | HR 49 | Temp 97.7°F | Ht 68.0 in

## 2022-11-12 DIAGNOSIS — R202 Paresthesia of skin: Secondary | ICD-10-CM

## 2022-11-12 NOTE — Patient Instructions (Signed)
-  It was a pleasure to see you today! Please review your visit summary for helpful information -I would encourage you to follow your care via MyChart where you can access lab results, notes, messages, and more -If you feel that we did a nice job today, please complete your after-visit survey and leave us a Google review! Your CMA today was Kieandra and your provider was Dan Waddell, PA-C, DMSc   

## 2022-11-12 NOTE — Progress Notes (Signed)
Date:  11/12/2022   Name:  Diana Mcdaniel   DOB:  1950/04/22   MRN:  [id number]   Chief Complaint: Arm Pain (Tingling in arm on and off. Started a week ago. Yesterday the tingling was happening frequently. Starts in upper arm, and tingles in fingers. Sometimes it only radiates into thumb, and other times it radiates into first 3 fingers. No chest pain, no sob.)  Arm Pain  Pertinent negatives include no chest pain or numbness.   Sharry is a very pleasant 72 year old female with a history of HTN, sarcoidosis, essential tremor new to me today, typically sees my colleague Dr. Bari Edward, MD for routine care, here for evaluation of intermittent fleeting paresthesia of the right arm for the last week.  This sensation is described as "tingling, chill, goosebumps" and appears to be entirely random, lasting only seconds before spontaneous resolution.  It is not associated with any functional loss or weakness, and she denies any other neurological symptoms.  She states that she did wake up the other morning with pretty much the entire arm numb, which she suspects was from sleeping on it; this improved spontaneously as well.   Medication list has been reviewed and updated.  Current Meds  Medication Sig   albuterol (VENTOLIN HFA) 108 (90 Base) MCG/ACT inhaler Inhale 2 puffs into the lungs every 6 (six) hours as needed for wheezing or shortness of breath.   atenolol (TENORMIN) 25 MG tablet TAKE 1 TABLET (25 MG TOTAL) BY MOUTH DAILY.   Calcium-Magnesium-Vitamin D (CALCIUM 1200+D3 PO) Take by mouth daily.   clonazePAM (KLONOPIN) 0.5 MG tablet TAKE 1 TABLET BY MOUTH TWICE A DAY AS NEEDED FOR ANXIETY   FLUoxetine (PROZAC) 20 MG capsule TAKE 1 CAPSULE BY MOUTH EVERY DAY   losartan (COZAAR) 25 MG tablet TAKE 1 TABLET (25 MG TOTAL) BY MOUTH DAILY.   mometasone (ELOCON) 0.1 % cream APPLY 1 APPLICATION TOPICALLY DAILY AS NEEDED (RASH).   mometasone (ELOCON) 0.1 % cream Apply 1 Application topically 2  (two) times daily. Up to 5 days per week prn rash   mometasone (ELOCON) 0.1 % cream Apply 1 Application topically as directed. Apply to rash on legs and chest qd/bid 3 days a week Monday, Wednesday and Fridays   mometasone (ELOCON) 0.1 % lotion Apply topically daily. Apply 1 drop daily 5 days weekly to affected nails   simvastatin (ZOCOR) 20 MG tablet TAKE 1 TABLET BY MOUTH EVERY DAY     Review of Systems  Constitutional:  Negative for fatigue and fever.  Respiratory:  Negative for chest tightness and shortness of breath.   Cardiovascular:  Negative for chest pain and palpitations.  Gastrointestinal:  Negative for abdominal pain.  Neurological:  Positive for tremors. Negative for weakness and numbness.       Intermittent right arm paresthesia    Patient Active Problem List   Diagnosis Date Noted   Primary insomnia 05/03/2019   Family history of colon cancer in father    Polyp of sigmoid colon    Osteopenia determined by x-ray 05/26/2018   Sarcoidosis of lung (HCC) 04/29/2018   Migraine headache with aura 04/29/2018   Tremor, essential 04/29/2018   Panic disorder 04/29/2018   Essential hypertension 12/01/2017   Mixed hyperlipidemia 12/01/2017   Ganglion cyst of foot 12/01/2017   Major depressive disorder with single episode, in partial remission (HCC) 12/01/2017   Primary osteoarthritis of left hip 12/01/2017    No Known Allergies  Immunization History  Administered  Date(s) Administered   Fluad Quad(high Dose 65+) 11/25/2018, 12/03/2019   Influenza, High Dose Seasonal PF 01/07/2018, 12/26/2020, 11/28/2021   Moderna SARS-COV2 Booster Vaccination 11/24/2020   Moderna Sars-Covid-2 Vaccination 04/30/2019, 05/29/2019, 01/07/2020, 06/23/2020, 11/24/2020   PNEUMOCOCCAL CONJUGATE-20 05/08/2021   Pneumococcal Polysaccharide-23 05/04/2020   Rsv, Bivalent, Protein Subunit Rsvpref,pf Verdis Frederickson) 11/28/2021   Tdap 11/28/2021   Unspecified SARS-COV-2 Vaccination 12/10/2021    Past  Surgical History:  Procedure Laterality Date   CARDIAC CATHETERIZATION  2010   Normal   COLONOSCOPY WITH PROPOFOL N/A 09/28/2018   Procedure: COLONOSCOPY WITH  biopsy;  Surgeon: Midge Minium, MD;  Location: [facility];  Service: Endoscopy;  Laterality: N/A;   POLYPECTOMY  09/28/2018   Procedure: POLYPECTOMY;  Surgeon: Midge Minium, MD;  Location: [facility];  Service: Endoscopy;;    Social History   Tobacco Use   Smoking status: Former    Current packs/day: 0.00    Average packs/day: 1 pack/day for 15.0 years (15.0 ttl pk-yrs)    Types: Cigarettes    Start date: 03/18/1978    Quit date: 03/18/1993    Years since quitting: 29.6   Smokeless tobacco: Never  Vaping Use   Vaping status: Never Used  Substance Use Topics   Alcohol use: Yes    Alcohol/week: 24.0 standard drinks of alcohol    Types: 24 Glasses of wine per week    Comment: 3-4 glasses wine/day   Drug use: Never    Family History  Problem Relation Age of Onset   Colon cancer Father    Skin cancer Sister    Breast cancer Sister 43   Skin cancer Brother    Breast cancer Maternal Grandmother 56   Diabetes Neg Hx    Heart disease Neg Hx    Hypertension Neg Hx         11/12/2022   10:30 AM 05/13/2022   10:39 AM 11/05/2021    9:37 AM 05/08/2021   10:00 AM  GAD 7 : Generalized Anxiety Score  Nervous, Anxious, on Edge 0 0 2 0  Control/stop worrying 0 0 0 0  Worry too much - different things 0 0 0 0  Trouble relaxing 0 0 0 0  Restless 0 0 0 0  Easily annoyed or irritable 0 0 0 0  Afraid - awful might happen 0 0 0 0  Total GAD 7 Score 0 0 2 0  Anxiety Difficulty Not difficult at all Not difficult at all Not difficult at all        11/12/2022   10:30 AM 05/13/2022   10:39 AM 01/09/2022    2:23 PM  Depression screen PHQ 2/9  Decreased Interest 0 0 0  Down, Depressed, Hopeless 0 0 0  PHQ - 2 Score 0 0 0  Altered sleeping 0 0 0  Tired, decreased energy 0 0 0  Change in appetite 0 0 0  Feeling bad  or failure about yourself  0 0 0  Trouble concentrating 0 0 0  Moving slowly or fidgety/restless 0 0 0  Suicidal thoughts 0 0 0  PHQ-9 Score 0 0 0  Difficult doing work/chores Not difficult at all Not difficult at all Not difficult at all    BP Readings from Last 3 Encounters:  11/12/22 136/80  05/13/22 122/78  04/11/22 104/68    Wt Readings from Last 3 Encounters:  05/13/22 209 lb (94.8 kg)  01/09/22 204 lb 12.8 oz (92.9 kg)  11/05/21 204 lb 12.8 oz (92.9 kg)  BP 136/80   Pulse (!) 49   Temp 97.7 F (36.5 C) (Oral)   Ht 5\' 8"  (1.727 m)   SpO2 97%   BMI 31.78 kg/m   Physical Exam Vitals and nursing note reviewed.  Constitutional:      Appearance: Normal appearance.  Cardiovascular:     Rate and Rhythm: Normal rate and regular rhythm.     Heart sounds: No murmur heard.    No friction rub. No gallop.  Pulmonary:     Effort: Pulmonary effort is normal.     Breath sounds: Normal breath sounds.  Abdominal:     General: There is no distension.  Musculoskeletal:        General: Normal range of motion.     Comments: Full ROM at the right shoulder, elbow, and wrist.  No cervical neck tenderness or significant muscle tension in the neck, trapezius, or shoulder.  Skin:    General: Skin is warm and dry.  Neurological:     Mental Status: She is alert and oriented to person, place, and time.     Gait: Gait is intact.     Comments: Tinel's negative at the right carpal and cubital tunnels.  Phalen's also negative.    Psychiatric:        Mood and Affect: Mood and affect normal.     Recent Labs     Component Value Date/Time   NA 135 05/13/2022 1108   K 4.6 05/13/2022 1108   CL 97 05/13/2022 1108   CO2 21 05/13/2022 1108   GLUCOSE 105 (H) 05/13/2022 1108   BUN 11 05/13/2022 1108   CREATININE 0.83 05/13/2022 1108   CALCIUM 9.9 05/13/2022 1108   PROT 7.0 05/13/2022 1108   ALBUMIN 4.3 05/13/2022 1108   AST 20 05/13/2022 1108   ALT 15 05/13/2022 1108   ALKPHOS 86  05/13/2022 1108   BILITOT 0.6 05/13/2022 1108   GFRNONAA 69 05/04/2020 0951   GFRAA 80 05/04/2020 0951    Lab Results  Component Value Date   WBC 5.1 05/13/2022   HGB 13.0 05/13/2022   HCT 38.0 05/13/2022   MCV 86 05/13/2022   PLT 291 05/13/2022   Lab Results  Component Value Date   HGBA1C 5.5 05/04/2020   Lab Results  Component Value Date   CHOL 197 05/13/2022   HDL 69 05/13/2022   LDLCALC 107 (H) 05/13/2022   TRIG 122 05/13/2022   CHOLHDL 2.9 05/13/2022   Lab Results  Component Value Date   TSH 3.390 05/13/2022     Assessment and Plan:  1. Paresthesia of right arm Patient reassured very likely this is simply an irritated nerve likely near the right brachial plexus.  Probably caused by sleeping on the arm or perhaps mild muscle strain.  Encouraged gentle stretching and range of motion exercises of the shoulder and arm at least twice per day. Expect spontaneous resolution in 1-2 weeks.   Considered checking B12 and folate today since she does not use any supplementation for this, but to save her the phlebotomy I have simply recommended she start a multivitamin or a B complex supplement at least for the next couple weeks, which may or may not have any benefit for the paresthesia.  Follow-up as scheduled 11/22/2022 with usual provider  Alvester Morin, PA-C, DMSc, Nutritionist [facility] Primary Care and [facility] [phone]

## 2022-11-22 ENCOUNTER — Ambulatory Visit: Payer: Medicare PPO | Admitting: Internal Medicine

## 2022-11-22 ENCOUNTER — Encounter: Payer: Self-pay | Admitting: Internal Medicine

## 2022-11-22 VITALS — BP 120/72 | HR 49 | Ht 68.0 in | Wt 211.0 lb

## 2022-11-22 DIAGNOSIS — F410 Panic disorder [episodic paroxysmal anxiety] without agoraphobia: Secondary | ICD-10-CM

## 2022-11-22 DIAGNOSIS — R202 Paresthesia of skin: Secondary | ICD-10-CM | POA: Insufficient documentation

## 2022-11-22 DIAGNOSIS — I10 Essential (primary) hypertension: Secondary | ICD-10-CM

## 2022-11-22 DIAGNOSIS — M67479 Ganglion, unspecified ankle and foot: Secondary | ICD-10-CM

## 2022-11-22 NOTE — Progress Notes (Signed)
Date:  11/22/2022   Name:  Diana Mcdaniel   DOB:  1950-11-03   MRN:  [id number]   Chief Complaint: Hypertension  Hypertension This is a chronic problem. The problem is controlled. Pertinent negatives include no chest pain, headaches, palpitations or shortness of breath. Past treatments include angiotensin blockers and beta blockers. The current treatment provides significant improvement. There is no history of kidney disease, CAD/MI or CVA.   Right arm tingling - intermittent mild tingling from mid humerus to the wrist.  No weakness or pain, no particular associated activity.  It does not wake her from sleep.  She has some mild discomfort in the posterior neck and right shoulder.  She has not tried anything for the symptoms.  Lab Results  Component Value Date   NA 135 05/13/2022   K 4.6 05/13/2022   CO2 21 05/13/2022   GLUCOSE 105 (H) 05/13/2022   BUN 11 05/13/2022   CREATININE 0.83 05/13/2022   CALCIUM 9.9 05/13/2022   EGFR 74 05/13/2022   GFRNONAA 69 05/04/2020   Lab Results  Component Value Date   CHOL 197 05/13/2022   HDL 69 05/13/2022   LDLCALC 107 (H) 05/13/2022   TRIG 122 05/13/2022   CHOLHDL 2.9 05/13/2022   Lab Results  Component Value Date   TSH 3.390 05/13/2022   Lab Results  Component Value Date   HGBA1C 5.5 05/04/2020   Lab Results  Component Value Date   WBC 5.1 05/13/2022   HGB 13.0 05/13/2022   HCT 38.0 05/13/2022   MCV 86 05/13/2022   PLT 291 05/13/2022   Lab Results  Component Value Date   ALT 15 05/13/2022   AST 20 05/13/2022   ALKPHOS 86 05/13/2022   BILITOT 0.6 05/13/2022   Lab Results  Component Value Date   VD25OH 41.8 05/04/2020     Review of Systems  Constitutional:  Negative for fatigue and unexpected weight change.  HENT:  Negative for nosebleeds.   Eyes:  Negative for visual disturbance.  Respiratory:  Negative for cough, chest tightness, shortness of breath and wheezing.   Cardiovascular:  Negative for chest pain,  palpitations and leg swelling.  Gastrointestinal:  Negative for abdominal pain, constipation and diarrhea.  Musculoskeletal:  Positive for back pain. Negative for arthralgias, joint swelling and myalgias.  Neurological:  Positive for tremors and numbness. Negative for dizziness, weakness, light-headedness and headaches.  Psychiatric/Behavioral:  Negative for dysphoric mood and sleep disturbance. The patient is not nervous/anxious.     Patient Active Problem List   Diagnosis Date Noted   Primary insomnia 05/03/2019   Family history of colon cancer in father    Polyp of sigmoid colon    Osteopenia determined by x-ray 05/26/2018   Sarcoidosis of lung (HCC) 04/29/2018   Migraine headache with aura 04/29/2018   Tremor, essential 04/29/2018   Panic disorder 04/29/2018   Essential hypertension 12/01/2017   Mixed hyperlipidemia 12/01/2017   Ganglion cyst of foot 12/01/2017   Major depressive disorder with single episode, in partial remission (HCC) 12/01/2017   Primary osteoarthritis of left hip 12/01/2017    No Known Allergies  Past Surgical History:  Procedure Laterality Date   CARDIAC CATHETERIZATION  2010   Normal   COLONOSCOPY WITH PROPOFOL N/A 09/28/2018   Procedure: COLONOSCOPY WITH  biopsy;  Surgeon: Midge Minium, MD;  Location: [facility];  Service: Endoscopy;  Laterality: N/A;   POLYPECTOMY  09/28/2018   Procedure: POLYPECTOMY;  Surgeon: Midge Minium, MD;  Location[facility];  Service: Endoscopy;;    Social History   Tobacco Use   Smoking status: Former    Current packs/day: 0.00    Average packs/day: 1 pack/day for 15.0 years (15.0 ttl pk-yrs)    Types: Cigarettes    Start date: 03/18/1978    Quit date: 03/18/1993    Years since quitting: 29.7   Smokeless tobacco: Never  Vaping Use   Vaping status: Never Used  Substance Use Topics   Alcohol use: Yes    Alcohol/week: 24.0 standard drinks of alcohol    Types: 24 Glasses of wine per week    Comment:  3-4 glasses wine/day   Drug use: Never     Medication list has been reviewed and updated.  Current Meds  Medication Sig   albuterol (VENTOLIN HFA) 108 (90 Base) MCG/ACT inhaler Inhale 2 puffs into the lungs every 6 (six) hours as needed for wheezing or shortness of breath.   atenolol (TENORMIN) 25 MG tablet TAKE 1 TABLET (25 MG TOTAL) BY MOUTH DAILY.   b complex vitamins capsule Take 1 capsule by mouth daily.   Calcium-Magnesium-Vitamin D (CALCIUM 1200+D3 PO) Take by mouth daily.   clonazePAM (KLONOPIN) 0.5 MG tablet TAKE 1 TABLET BY MOUTH TWICE A DAY AS NEEDED FOR ANXIETY   FLUoxetine (PROZAC) 20 MG capsule TAKE 1 CAPSULE BY MOUTH EVERY DAY   losartan (COZAAR) 25 MG tablet TAKE 1 TABLET (25 MG TOTAL) BY MOUTH DAILY.   mometasone (ELOCON) 0.1 % cream APPLY 1 APPLICATION TOPICALLY DAILY AS NEEDED (RASH).   mometasone (ELOCON) 0.1 % cream Apply 1 Application topically 2 (two) times daily. Up to 5 days per week prn rash   mometasone (ELOCON) 0.1 % cream Apply 1 Application topically as directed. Apply to rash on legs and chest qd/bid 3 days a week Monday, Wednesday and Fridays   mometasone (ELOCON) 0.1 % lotion Apply topically daily. Apply 1 drop daily 5 days weekly to affected nails   simvastatin (ZOCOR) 20 MG tablet TAKE 1 TABLET BY MOUTH EVERY DAY       11/22/2022   10:02 AM 11/12/2022   10:30 AM 05/13/2022   10:39 AM 11/05/2021    9:37 AM  GAD 7 : Generalized Anxiety Score  Nervous, Anxious, on Edge 0 0 0 2  Control/stop worrying 0 0 0 0  Worry too much - different things 0 0 0 0  Trouble relaxing 0 0 0 0  Restless 0 0 0 0  Easily annoyed or irritable 0 0 0 0  Afraid - awful might happen 0 0 0 0  Total GAD 7 Score 0 0 0 2  Anxiety Difficulty Not difficult at all Not difficult at all Not difficult at all Not difficult at all       11/22/2022   10:01 AM 11/12/2022   10:30 AM 05/13/2022   10:39 AM  Depression screen PHQ 2/9  Decreased Interest 0 0 0  Down, Depressed, Hopeless 0 0  0  PHQ - 2 Score 0 0 0  Altered sleeping 0 0 0  Tired, decreased energy 3 0 0  Change in appetite 0 0 0  Feeling bad or failure about yourself  0 0 0  Trouble concentrating 0 0 0  Moving slowly or fidgety/restless 0 0 0  Suicidal thoughts 0 0 0  PHQ-9 Score 3 0 0  Difficult doing work/chores Not difficult at all Not difficult at all Not difficult at all    BP Readings from Last  3 Encounters:  11/22/22 120/72  11/12/22 136/80  05/13/22 122/78    Physical Exam Vitals and nursing note reviewed.  Constitutional:      General: She is not in acute distress.    Appearance: Normal appearance. She is well-developed.  HENT:     Head: Normocephalic and atraumatic.  Cardiovascular:     Rate and Rhythm: Normal rate and regular rhythm.  Pulmonary:     Effort: Pulmonary effort is normal. No respiratory distress.     Breath sounds: No wheezing or rhonchi.  Musculoskeletal:     Right wrist: No crepitus. Normal range of motion.     Left wrist: No crepitus. Normal range of motion.     Cervical back: Normal range of motion. No swelling or spasms. Normal range of motion.     Lumbar back: No tenderness or bony tenderness.     Comments: Negative Tinel's and Phalen's on the right  Lymphadenopathy:     Cervical: No cervical adenopathy.  Skin:    General: Skin is warm and dry.     Findings: No rash.  Neurological:     Mental Status: She is alert and oriented to person, place, and time.     Sensory: Sensation is intact.     Motor: Tremor present. No weakness.  Psychiatric:        Mood and Affect: Mood normal.        Behavior: Behavior normal.     Wt Readings from Last 3 Encounters:  11/22/22 211 lb (95.7 kg)  05/13/22 209 lb (94.8 kg)  01/09/22 204 lb 12.8 oz (92.9 kg)    BP 120/72 (BP Location: Left Arm, Cuff Size: Large)   Pulse (!) 49   Ht 5\' 8"  (1.727 m)   Wt 211 lb (95.7 kg)   SpO2 97%   BMI 32.08 kg/m   Assessment and Plan:  Problem List Items Addressed This Visit        Unprioritized   Panic disorder    Managed with PRN clonazepam      Ganglion cyst of foot    This continues to be a minor annoyance - hard to find shoes to fit and it may be enlarging slightly She has seen Ortho and would need surgical removal - at this time she is no symptomatic to the degree that she wants to pursue this route      Essential hypertension - Primary (Chronic)    Normal exam with stable BP on atenolol and losartan. No concerns or side effects to current medication. No change in regimen; continue low sodium diet.       Other Visit Diagnoses     Tingling of right upper extremity       mild intermittent symptoms suspect mild nerve compression at the neck or possible the elbow monitor for worsening - may need Neurology EMG/NCS evaluation       Return in about 6 months (around 05/22/2023) for CPX.    Reubin Milan, MD [facility] Primary Care and Sports Medicine [place]

## 2022-11-22 NOTE — Assessment & Plan Note (Signed)
This continues to be a minor annoyance - hard to find shoes to fit and it may be enlarging slightly She has seen Ortho and would need surgical removal - at this time she is no symptomatic to the degree that she wants to pursue this route

## 2022-11-22 NOTE — Assessment & Plan Note (Signed)
Managed with PRN clonazepam

## 2022-11-22 NOTE — Assessment & Plan Note (Signed)
Normal exam with stable BP on atenolol and losartan. No concerns or side effects to current medication. No change in regimen; continue low sodium diet.

## 2023-01-14 ENCOUNTER — Telehealth: Payer: Self-pay

## 2023-01-14 ENCOUNTER — Ambulatory Visit: Payer: Medicare PPO

## 2023-01-14 NOTE — Telephone Encounter (Signed)
Unsuccessful attempt to reach patient on preferred number listed in notes for scheduled AWV. Left message on voicemail okay to reschedule. 

## 2023-01-21 ENCOUNTER — Other Ambulatory Visit: Payer: Self-pay | Admitting: Internal Medicine

## 2023-01-21 DIAGNOSIS — F410 Panic disorder [episodic paroxysmal anxiety] without agoraphobia: Secondary | ICD-10-CM

## 2023-01-22 NOTE — Telephone Encounter (Signed)
Please review.  KP

## 2023-01-22 NOTE — Telephone Encounter (Signed)
Requested medication (s) are due for refill today: yes  Requested medication (s) are on the active medication list: yes  Last refill:  07/30/22 #20 3 RF  Future visit scheduled:yes  Notes to clinic:  med not delegated to NT to RF   Requested Prescriptions  Pending Prescriptions Disp Refills   clonazePAM (KLONOPIN) 0.5 MG tablet [Pharmacy Med Name: CLONAZEPAM 0.5 MG TABLET] 20 tablet 3    Sig: TAKE 1 TABLET BY MOUTH TWICE A DAY AS NEEDED FOR ANXIETY     Not Delegated - Psychiatry: Anxiolytics/Hypnotics 2 Failed - 01/21/2023 10:38 AM      Failed - This refill cannot be delegated      Failed - Urine Drug Screen completed in last 360 days      Passed - Patient is not pregnant      Passed - Valid encounter within last 6 months    Recent Outpatient Visits           2 months ago Essential hypertension   [place] Primary Care & Sports Medicine at [facility], Nyoka Cowden, MD   2 months ago Paresthesia of right arm   [facility] Primary Care & Sports Medicine at [facility] [place] [facility], Melton Alar, PA   8 months ago Annual physical exam   [facility] Primary Care & Sports Medicine at [facility], Nyoka Cowden, MD   1 year ago Essential hypertension    Primary Care & Sports Medicine at [facility], Nyoka Cowden, MD   1 year ago Annual physical exam   [facility] Primary Care & Sports Medicine at [facility], Nyoka Cowden, MD       Future Appointments             In 2 weeks Deirdre Evener, MD [facility] [place]   In 4 months Judithann Graves, Nyoka Cowden, MD [facility] Primary Care & Sports Medicine at [facility], [facility]

## 2023-01-22 NOTE — Telephone Encounter (Signed)
Requested medication (s) are due for refill today: yes  Requested medication (s) are on the active medication list:yes  Last refill:  07/30/22 #20 3 RF   Future visit scheduled:yes  Notes to clinic:  med not delegated to NT to order   Requested Prescriptions  Pending Prescriptions Disp Refills   clonazePAM (KLONOPIN) 0.5 MG tablet [Pharmacy Med Name: CLONAZEPAM 0.5 MG TABLET] 20 tablet 3    Sig: TAKE 1 TABLET BY MOUTH TWICE A DAY AS NEEDED FOR ANXIETY     Not Delegated - Psychiatry: Anxiolytics/Hypnotics 2 Failed - 01/22/2023  1:01 PM      Failed - This refill cannot be delegated      Failed - Urine Drug Screen completed in last 360 days      Passed - Patient is not pregnant      Passed - Valid encounter within last 6 months    Recent Outpatient Visits           2 months ago Essential hypertension   [place] Primary Care & Sports Medicine at [facility], Nyoka Cowden, MD   2 months ago Paresthesia of right arm   [facility] Primary Care & Sports Medicine at [facility] [place] [facility]dec[facility] Maes, Melton Alar, PA   8 months ago Annual physical exam   [facility] Primary Care & Sports Medicine at [facility], Nyoka Cowden, MD   1 year ago Essential hypertension    Primary Care & Sports Medicine at [facility], Nyoka Cowden, MD   1 year ago Annual physical exam   [facility] Primary Care & Sports Medicine at [facility], Nyoka Cowden, MD       Future Appointments             In 2 weeks Deirdre Evener, MD [facility] [place] [facility]   In 4 months Judithann Graves, Nyoka Cowden, MD [facility] Primary Care & Sports Medicine at [facility], [facility]

## 2023-02-05 ENCOUNTER — Ambulatory Visit: Payer: Medicare PPO | Admitting: Dermatology

## 2023-02-05 DIAGNOSIS — L820 Inflamed seborrheic keratosis: Secondary | ICD-10-CM

## 2023-02-05 DIAGNOSIS — D229 Melanocytic nevi, unspecified: Secondary | ICD-10-CM | POA: Diagnosis not present

## 2023-02-05 DIAGNOSIS — L814 Other melanin hyperpigmentation: Secondary | ICD-10-CM | POA: Diagnosis not present

## 2023-02-05 DIAGNOSIS — Z7189 Other specified counseling: Secondary | ICD-10-CM | POA: Diagnosis not present

## 2023-02-05 DIAGNOSIS — L578 Other skin changes due to chronic exposure to nonionizing radiation: Secondary | ICD-10-CM | POA: Diagnosis not present

## 2023-02-05 DIAGNOSIS — L719 Rosacea, unspecified: Secondary | ICD-10-CM

## 2023-02-05 DIAGNOSIS — Z1283 Encounter for screening for malignant neoplasm of skin: Secondary | ICD-10-CM

## 2023-02-05 DIAGNOSIS — L2089 Other atopic dermatitis: Secondary | ICD-10-CM | POA: Diagnosis not present

## 2023-02-05 NOTE — Progress Notes (Signed)
Follow-Up Visit   Subjective  Diana Mcdaniel is a 72 y.o. female who presents for the following: Skin Cancer Screening and Full Body Skin Exam  The patient presents for Total-Body Skin Exam (TBSE) for skin cancer screening and mole check. The patient has spots, moles and lesions to be evaluated, some may be new or changing and the patient may have concern these could be cancer.    The following portions of the chart were reviewed this encounter and updated as appropriate: medications, allergies, medical history  Review of Systems:  No other skin or systemic complaints except as noted in HPI or Assessment and Plan.  Objective  Well appearing patient in no apparent distress; mood and affect are within normal limits.  A full examination was performed including scalp, head, eyes, ears, nose, lips, neck, chest, axillae, abdomen, back, buttocks, bilateral upper extremities, bilateral lower extremities, hands, feet, fingers, toes, fingernails, and toenails. All findings within normal limits unless otherwise noted below.   Relevant physical exam findings are noted in the Assessment and Plan.  Scalp x 1 Erythematous stuck-on, waxy papule or plaque    Assessment & Plan   SKIN CANCER SCREENING PERFORMED TODAY.  ACTINIC DAMAGE - Chronic condition, secondary to cumulative UV/sun exposure - diffuse scaly erythematous macules with underlying dyspigmentation - Recommend daily broad spectrum sunscreen SPF 30+ to sun-exposed areas, reapply every 2 hours as needed.  - Staying in the shade or wearing long sleeves, sun glasses (UVA+UVB protection) and wide brim hats (4-inch brim around the entire circumference of the hat) are also recommended for sun protection.  - Call for new or changing lesions.  LENTIGINES, SEBORRHEIC KERATOSES, HEMANGIOMAS - Benign normal skin lesions - Benign-appearing - Call for any changes  MELANOCYTIC NEVI - Tan-brown and/or pink-flesh-colored symmetric macules  and papules - Benign appearing on exam today - Observation - Call clinic for new or changing moles - Recommend daily use of broad spectrum spf 30+ sunscreen to sun-exposed areas.   Purpura - Chronic; persistent and recurrent.  Treatable, but not curable. - Violaceous macules and patches - Benign - Related to trauma, age, sun damage and/or use of blood thinners, chronic use of topical and/or oral steroids - Observe - Can use OTC arnica containing moisturizer such as Dermend Bruise Formula if desired - Call for worsening or other concerns  ROSACEA Exam Mid face erythema with telangiectasias  Chronic and persistent condition with duration or expected duration over one year. Condition is symptomatic/ bothersome to patient. Not currently at goal.  Rosacea is a chronic progressive skin condition usually affecting the face of adults, causing redness and/or acne bumps. It is treatable but not curable. It sometimes affects the eyes (ocular rosacea) as well. It may respond to topical and/or systemic medication and can flare with stress, sun exposure, alcohol, exercise, topical steroids (including hydrocortisone/cortisone 10) and some foods.  Daily application of broad spectrum spf 30+ sunscreen to face is recommended to reduce flares.  Treatment Plan Counseling for BBL / IPL / Laser and Coordination of Care Discussed the treatment option of Broad Band Light (BBL) /Intense Pulsed Light (IPL)/ Laser for skin discoloration, including brown spots and redness.  Typically we recommend at least 1-3 treatment sessions about 5-8 weeks apart for best results.  Cannot have tanned skin when BBL performed, and regular use of sunscreen/photoprotection is advised after the procedure to help maintain results. The patient's condition may also require "maintenance treatments" in the future.  The fee for BBL / laser  treatments is $350 per treatment session for the whole face.  A fee can be quoted for other parts of the  body.  Insurance typically does not pay for BBL/laser treatments and therefore the fee is an out-of-pocket cost. Recommend prophylactic valtrex treatment. Once scheduled for procedure, will send Rx in prior to patient's appointment.   LICHENOID SPONGIOTIC DERMATITIS bx proven Rash most consistent with Eczema (but medication reaction is possibility)  Plan repeat bx at follow up to rule out CTCL Patient wants to hold off doing anything this visit  Inflamed seborrheic keratosis Scalp x 1 Plan to treat at follow up appointment. Patient wants to hold off doing things this visit patient wants to hold off doing things this visit   Return in about 1 year (around 02/05/2024) for TBSE; May follow up for Bx and ISK tx.  I, Cari Caraway, CMA, am acting as scribe for Armida Sans, MD .   Documentation: I have reviewed the above documentation for accuracy and completeness, and I agree with the above.  Armida Sans, MD

## 2023-02-05 NOTE — Patient Instructions (Signed)

## 2023-02-11 ENCOUNTER — Encounter: Payer: Self-pay | Admitting: Dermatology

## 2023-03-24 DIAGNOSIS — R918 Other nonspecific abnormal finding of lung field: Secondary | ICD-10-CM | POA: Diagnosis not present

## 2023-03-24 DIAGNOSIS — R0600 Dyspnea, unspecified: Secondary | ICD-10-CM | POA: Diagnosis not present

## 2023-03-24 DIAGNOSIS — J9811 Atelectasis: Secondary | ICD-10-CM | POA: Diagnosis not present

## 2023-03-24 DIAGNOSIS — R062 Wheezing: Secondary | ICD-10-CM | POA: Diagnosis not present

## 2023-03-24 DIAGNOSIS — R0602 Shortness of breath: Secondary | ICD-10-CM | POA: Diagnosis not present

## 2023-03-24 DIAGNOSIS — D869 Sarcoidosis, unspecified: Secondary | ICD-10-CM | POA: Diagnosis not present

## 2023-03-24 DIAGNOSIS — M479 Spondylosis, unspecified: Secondary | ICD-10-CM | POA: Diagnosis not present

## 2023-03-24 DIAGNOSIS — Z20822 Contact with and (suspected) exposure to covid-19: Secondary | ICD-10-CM | POA: Diagnosis not present

## 2023-03-24 DIAGNOSIS — J069 Acute upper respiratory infection, unspecified: Secondary | ICD-10-CM | POA: Diagnosis not present

## 2023-03-31 ENCOUNTER — Encounter: Payer: Self-pay | Admitting: Internal Medicine

## 2023-03-31 ENCOUNTER — Ambulatory Visit: Payer: Medicare PPO | Admitting: Internal Medicine

## 2023-03-31 VITALS — BP 114/64 | HR 62 | Temp 98.0°F | Ht 68.0 in | Wt 208.0 lb

## 2023-03-31 DIAGNOSIS — I10 Essential (primary) hypertension: Secondary | ICD-10-CM | POA: Diagnosis not present

## 2023-03-31 DIAGNOSIS — F410 Panic disorder [episodic paroxysmal anxiety] without agoraphobia: Secondary | ICD-10-CM

## 2023-03-31 DIAGNOSIS — E782 Mixed hyperlipidemia: Secondary | ICD-10-CM | POA: Diagnosis not present

## 2023-03-31 DIAGNOSIS — J40 Bronchitis, not specified as acute or chronic: Secondary | ICD-10-CM

## 2023-03-31 MED ORDER — CLONAZEPAM 0.5 MG PO TABS: ORAL_TABLET | ORAL | 1 refills | Status: DC

## 2023-03-31 MED ORDER — SIMVASTATIN 20 MG PO TABS: 20.0000 mg | ORAL_TABLET | Freq: Every day | ORAL | 3 refills | Status: DC

## 2023-03-31 MED ORDER — AMOXICILLIN-POT CLAVULANATE 875-125 MG PO TABS: 1.0000 | ORAL_TABLET | Freq: Two times a day (BID) | ORAL | 0 refills | Status: AC

## 2023-03-31 MED ORDER — ATENOLOL 25 MG PO TABS: 25.0000 mg | ORAL_TABLET | Freq: Every day | ORAL | 3 refills | Status: DC

## 2023-03-31 MED ORDER — PREDNISONE 10 MG PO TABS: ORAL_TABLET | ORAL | 0 refills | Status: AC

## 2023-03-31 NOTE — Progress Notes (Signed)
 Date:  03/31/2023   Name:  Diana Mcdaniel   DOB:  09/16/1950   MRN:  [id number]   Chief Complaint: URI (Follow up from ER 03/24/2023. Patient said she was not prescribed abx. Only given an inhaler. She is feeling the same. No better. Still SOB. Cough- red/yellow mucous. No fever.)  Cough This is a new problem. The current episode started in the past 7 days. The problem has been unchanged. The problem occurs every few minutes. The cough is Productive of sputum. Associated symptoms include shortness of breath and wheezing. Pertinent negatives include no chest pain, chills, ear pain, fever or headaches.    Review of Systems  Constitutional:  Negative for chills, fatigue, fever and unexpected weight change.  HENT:  Negative for ear pain, sinus pressure and trouble swallowing.   Respiratory:  Positive for cough, chest tightness, shortness of breath and wheezing.   Cardiovascular:  Negative for chest pain and palpitations.  Gastrointestinal:  Negative for abdominal pain, nausea and vomiting.  Neurological:  Negative for dizziness and headaches.  Psychiatric/Behavioral:  Positive for sleep disturbance. Negative for dysphoric mood. The patient is not nervous/anxious.      Lab Results  Component Value Date   NA 135 05/13/2022   K 4.6 05/13/2022   CO2 21 05/13/2022   GLUCOSE 105 (H) 05/13/2022   BUN 11 05/13/2022   CREATININE 0.83 05/13/2022   CALCIUM  9.9 05/13/2022   EGFR 74 05/13/2022   GFRNONAA 69 05/04/2020   Lab Results  Component Value Date   CHOL 197 05/13/2022   HDL 69 05/13/2022   LDLCALC 107 (H) 05/13/2022   TRIG 122 05/13/2022   CHOLHDL 2.9 05/13/2022   Lab Results  Component Value Date   TSH 3.390 05/13/2022   Lab Results  Component Value Date   HGBA1C 5.5 05/04/2020   Lab Results  Component Value Date   WBC 5.1 05/13/2022   HGB 13.0 05/13/2022   HCT 38.0 05/13/2022   MCV 86 05/13/2022   PLT 291 05/13/2022   Lab Results  Component Value Date   ALT 15  05/13/2022   AST 20 05/13/2022   ALKPHOS 86 05/13/2022   BILITOT 0.6 05/13/2022   Lab Results  Component Value Date   VD25OH 41.8 05/04/2020     Patient Active Problem List   Diagnosis Date Noted   Tingling of right upper extremity 11/22/2022   Primary insomnia 05/03/2019   Family history of colon cancer in father    Polyp of sigmoid colon    Osteopenia determined by x-ray 05/26/2018   Sarcoidosis of lung (HCC) 04/29/2018   Migraine headache with aura 04/29/2018   Tremor, essential 04/29/2018   Panic disorder 04/29/2018   Essential hypertension 12/01/2017   Mixed hyperlipidemia 12/01/2017   Ganglion cyst of foot 12/01/2017   Major depressive disorder with single episode, in partial remission (HCC) 12/01/2017   Primary osteoarthritis of left hip 12/01/2017    No Known Allergies  Past Surgical History:  Procedure Laterality Date   CARDIAC CATHETERIZATION  2010   Normal   COLONOSCOPY WITH PROPOFOL  N/A 09/28/2018   Procedure: COLONOSCOPY WITH  biopsy;  Surgeon: Jinny Carmine, MD;  Location: [facility];  Service: Endoscopy;  Laterality: N/A;   POLYPECTOMY  09/28/2018   Procedure: POLYPECTOMY;  Surgeon: Jinny Carmine, MD;  Location: [facility];  Service: Endoscopy;;    Social History   Tobacco Use   Smoking status: Former    Current packs/day: 0.00  Average packs/day: 1 pack/day for 15.0 years (15.0 ttl pk-yrs)    Types: Cigarettes    Start date: 03/18/1978    Quit date: 03/18/1993    Years since quitting: 30.0   Smokeless tobacco: Never  Vaping Use   Vaping status: Never Used  Substance Use Topics   Alcohol use: Yes    Alcohol/week: 24.0 standard drinks of alcohol    Types: 24 Glasses of wine per week    Comment: 3-4 glasses wine/day   Drug use: Never     Medication list has been reviewed and updated.  Current Meds  Medication Sig   albuterol  (VENTOLIN  HFA) 108 (90 Base) MCG/ACT inhaler Inhale 2 puffs into the lungs every 6 (six) hours as  needed for wheezing or shortness of breath.   amoxicillin -clavulanate (AUGMENTIN ) 875-125 MG tablet Take 1 tablet by mouth 2 (two) times daily for 10 days.   b complex vitamins capsule Take 1 capsule by mouth daily.   Calcium -Magnesium-Vitamin D (CALCIUM  1200+D3 PO) Take by mouth daily.   FLUoxetine  (PROZAC ) 20 MG capsule TAKE 1 CAPSULE BY MOUTH EVERY DAY   losartan  (COZAAR ) 25 MG tablet TAKE 1 TABLET (25 MG TOTAL) BY MOUTH DAILY.   mometasone  (ELOCON ) 0.1 % cream APPLY 1 APPLICATION TOPICALLY DAILY AS NEEDED (RASH).   predniSONE  (DELTASONE ) 10 MG tablet Take 6 tablets (60 mg total) by mouth daily with breakfast for 1 day, THEN 5 tablets (50 mg total) daily with breakfast for 1 day, THEN 4 tablets (40 mg total) daily with breakfast for 1 day, THEN 3 tablets (30 mg total) daily with breakfast for 1 day, THEN 2 tablets (20 mg total) daily with breakfast for 1 day, THEN 1 tablet (10 mg total) daily with breakfast for 1 day.   [DISCONTINUED] atenolol  (TENORMIN ) 25 MG tablet TAKE 1 TABLET (25 MG TOTAL) BY MOUTH DAILY.   [DISCONTINUED] clonazePAM  (KLONOPIN ) 0.5 MG tablet TAKE 1 TABLET BY MOUTH TWICE A DAY AS NEEDED FOR ANXIETY   [DISCONTINUED] simvastatin  (ZOCOR ) 20 MG tablet TAKE 1 TABLET BY MOUTH EVERY DAY       03/31/2023    8:46 AM 11/22/2022   10:02 AM 11/12/2022   10:30 AM 05/13/2022   10:39 AM  GAD 7 : Generalized Anxiety Score  Nervous, Anxious, on Edge 0 0 0 0  Control/stop worrying 0 0 0 0  Worry too much - different things 0 0 0 0  Trouble relaxing 0 0 0 0  Restless 0 0 0 0  Easily annoyed or irritable 0 0 0 0  Afraid - awful might happen 0 0 0 0  Total GAD 7 Score 0 0 0 0  Anxiety Difficulty Not difficult at all Not difficult at all Not difficult at all Not difficult at all       03/31/2023    9:00 AM 03/31/2023    8:46 AM 11/22/2022   10:01 AM  Depression screen PHQ 2/9  Decreased Interest 0 0 0  Down, Depressed, Hopeless 0 0 0  PHQ - 2 Score 0 0 0  Altered sleeping 0  0   Tired, decreased energy 0  3  Change in appetite 0  0  Feeling bad or failure about yourself  0  0  Trouble concentrating 0  0  Moving slowly or fidgety/restless 0  0  Suicidal thoughts 0  0  PHQ-9 Score 0  3  Difficult doing work/chores Not difficult at all  Not difficult at all    BP Readings  from Last 3 Encounters:  03/31/23 114/64  11/22/22 120/72  11/12/22 136/80    Physical Exam Vitals and nursing note reviewed.  Constitutional:      General: She is not in acute distress.    Appearance: Normal appearance. She is well-developed.  HENT:     Head: Normocephalic and atraumatic.  Cardiovascular:     Rate and Rhythm: Normal rate and regular rhythm.     Pulses: Normal pulses.  Pulmonary:     Effort: Pulmonary effort is normal. No respiratory distress.     Breath sounds: Transmitted upper airway sounds present. Decreased breath sounds present. No wheezing or rhonchi.  Musculoskeletal:     Cervical back: Normal range of motion.  Lymphadenopathy:     Cervical: No cervical adenopathy.  Skin:    General: Skin is warm and dry.     Findings: No rash.  Neurological:     Mental Status: She is alert and oriented to person, place, and time.  Psychiatric:        Mood and Affect: Mood normal.        Behavior: Behavior normal.     Wt Readings from Last 3 Encounters:  03/31/23 208 lb (94.3 kg)  11/22/22 211 lb (95.7 kg)  05/13/22 209 lb (94.8 kg)    BP 114/64   Pulse 62   Temp 98 F (36.7 C) (Oral)   Ht 5' 8 (1.727 m)   Wt 208 lb (94.3 kg)   SpO2 95%   BMI 31.63 kg/m   Assessment and Plan:  Problem List Items Addressed This Visit       Unprioritized   Essential hypertension (Chronic)   Relevant Medications   atenolol  (TENORMIN ) 25 MG tablet   simvastatin  (ZOCOR ) 20 MG tablet   Mixed hyperlipidemia (Chronic)   Relevant Medications   atenolol  (TENORMIN ) 25 MG tablet   simvastatin  (ZOCOR ) 20 MG tablet   Panic disorder   Relevant Medications   clonazePAM   (KLONOPIN ) 0.5 MG tablet   Other Visit Diagnoses       Bronchitis    -  Primary   worsening cough with colored sputum following nonspecifici URI continue Albuterol  MDI, Nyquil add Augmentin  and steroid taper   Relevant Medications   amoxicillin -clavulanate (AUGMENTIN ) 875-125 MG tablet   predniSONE  (DELTASONE ) 10 MG tablet       No follow-ups on file.    [name], MD [facility] [place]

## 2023-04-02 ENCOUNTER — Ambulatory Visit: Payer: Medicare PPO

## 2023-04-02 DIAGNOSIS — Z0000 Encounter for general adult medical examination without abnormal findings: Secondary | ICD-10-CM

## 2023-04-02 NOTE — Progress Notes (Signed)
 Subjective:   Diana Mcdaniel is a 73 y.o. female who presents for Medicare Annual (Subsequent) preventive examination.  Visit Complete: Virtual I connected with  Diana Mcdaniel on 04/02/23 by a audio enabled telemedicine application and verified that I am speaking with the correct person using two identifiers.  This patient declined Interactive audio and Acupuncturist. Therefore the visit was completed with audio only.  Patient Location: Home  Provider Location: Office/Clinic  I discussed the limitations of evaluation and management by telemedicine. The patient expressed understanding and agreed to proceed.  Vital Signs: Because this visit was a virtual/telehealth visit, some criteria may be missing or patient reported. Any vitals not documented were not able to be obtained and vitals that have been documented are patient reported.      Objective:    There were no vitals filed for this visit. There is no height or weight on file to calculate BMI.     04/02/2023    4:12 PM 01/09/2022    2:24 PM 01/03/2021    3:28 PM 01/03/2020    9:44 AM 09/28/2018    7:15 AM  Advanced Directives  Does Patient Have a Medical Advance Directive? Yes Yes Yes Yes Yes  Type of Estate agent of [place];Living will Living will Healthcare Power of [place];Living will Healthcare Power of [place];Living will   Does patient want to make changes to medical advance directive? No - Patient declined    No - Patient declined  Copy of Healthcare Power of Attorney in Chart? Yes - validated most recent copy scanned in chart (See row information)  No - copy requested No - copy requested     Current Medications (verified) Outpatient Encounter Medications as of 04/02/2023  Medication Sig   albuterol  (VENTOLIN  HFA) 108 (90 Base) MCG/ACT inhaler Inhale 2 puffs into the lungs every 6 (six) hours as needed for wheezing or shortness of breath.   atenolol  (TENORMIN ) 25 MG tablet  Take 1 tablet (25 mg total) by mouth daily.   b complex vitamins capsule Take 1 capsule by mouth daily.   Calcium-Magnesium-Vitamin D (CALCIUM 1200+D3 PO) Take by mouth daily.   clonazePAM  (KLONOPIN ) 0.5 MG tablet TAKE 1 TABLET BY MOUTH TWICE A DAY AS NEEDED FOR ANXIETY   FLUoxetine  (PROZAC ) 20 MG capsule TAKE 1 CAPSULE BY MOUTH EVERY DAY   losartan  (COZAAR ) 25 MG tablet TAKE 1 TABLET (25 MG TOTAL) BY MOUTH DAILY.   mometasone  (ELOCON ) 0.1 % cream APPLY 1 APPLICATION TOPICALLY DAILY AS NEEDED (RASH).   simvastatin  (ZOCOR ) 20 MG tablet Take 1 tablet (20 mg total) by mouth daily.   amoxicillin -clavulanate (AUGMENTIN ) 875-125 MG tablet Take 1 tablet by mouth 2 (two) times daily for 10 days. (Patient not taking: Reported on 04/02/2023)   predniSONE  (DELTASONE ) 10 MG tablet Take 6 tablets (60 mg total) by mouth daily with breakfast for 1 day, THEN 5 tablets (50 mg total) daily with breakfast for 1 day, THEN 4 tablets (40 mg total) daily with breakfast for 1 day, THEN 3 tablets (30 mg total) daily with breakfast for 1 day, THEN 2 tablets (20 mg total) daily with breakfast for 1 day, THEN 1 tablet (10 mg total) daily with breakfast for 1 day. (Patient not taking: Reported on 04/02/2023)   No facility-administered encounter medications on file as of 04/02/2023.    Allergies (verified) Patient has no known allergies.   History: Past Medical History:  Diagnosis Date   Anxiety    Arthritis 2005  Elevated TSH 11/02/2019   Normal T4 04/2019   Hyperlipidemia    Hypertension    Major depressive disorder with single episode, in partial remission (HCC)    Migraine headache    1-2x/year   Sarcoidosis    Past Surgical History:  Procedure Laterality Date   CARDIAC CATHETERIZATION  2010   Normal   COLONOSCOPY WITH PROPOFOL  N/A 09/28/2018   Procedure: COLONOSCOPY WITH  biopsy;  Surgeon: Marnee Sink, MD;  Location: [facility];  Service: Endoscopy;  Laterality: N/A;   POLYPECTOMY  09/28/2018    Procedure: POLYPECTOMY;  Surgeon: Marnee Sink, MD;  Location: [facility];  Service: Endoscopy;;   Family History  Problem Relation Age of Onset   Colon cancer Father    Skin cancer Sister    Breast cancer Sister 56   Skin cancer Brother    Breast cancer Maternal Grandmother 31   Diabetes Neg Hx    Heart disease Neg Hx    Hypertension Neg Hx    Social History   Socioeconomic History   Marital status: Married    Spouse name: Not on file   Number of children: 1   Years of education: Not on file   Highest education level: Master's degree (e.g., MA, MS, MEng, MEd, MSW, MBA)  Occupational History   Not on file  Tobacco Use   Smoking status: Former    Current packs/day: 0.00    Average packs/day: 1 pack/day for 15.0 years (15.0 ttl pk-yrs)    Types: Cigarettes    Start date: 03/18/1978    Quit date: 03/18/1993    Years since quitting: 30.0   Smokeless tobacco: Never  Vaping Use   Vaping status: Never Used  Substance and Sexual Activity   Alcohol use: Yes    Alcohol/week: 24.0 standard drinks of alcohol    Types: 24 Glasses of wine per week    Comment: 3-4 glasses wine/day   Drug use: Never   Sexual activity: Yes    Birth control/protection: Post-menopausal  Other Topics Concern   Not on file  Social History Narrative   Not on file   Social Drivers of Health   Financial Resource Strain: Low Risk  (04/02/2023)   Overall Financial Resource Strain (CARDIA)    Difficulty of Paying Living Expenses: Not hard at all  Food Insecurity: No Food Insecurity (04/02/2023)   Hunger Vital Sign    Worried About Running Out of Food in the Last Year: Never true    Ran Out of Food in the Last Year: Never true  Transportation Needs: No Transportation Needs (04/02/2023)   PRAPARE - Administrator, Civil Service (Medical): No    Lack of Transportation (Non-Medical): No  Physical Activity: Inactive (04/02/2023)   Exercise Vital Sign    Days of Exercise per Week: 0 days     Minutes of Exercise per Session: 0 min  Stress: No Stress Concern Present (04/02/2023)   [employer] of Occupational Health - Occupational Stress Questionnaire    Feeling of Stress : Not at all  Social Connections: Moderately Isolated (04/02/2023)   Social Connection and Isolation Panel [NHANES]    Frequency of Communication with Friends and Family: More than three times a week    Frequency of Social Gatherings with Friends and Family: Once a week    Attends Religious Services: Never    Database administrator or Organizations: No    Attends Banker Meetings: Never    Marital Status:  Married    Tobacco Counseling Counseling given: Not Answered   Clinical Intake:  Pre-visit preparation completed: Yes  Pain : No/denies pain     BMI - recorded: 31.6 Nutritional Status: BMI > 30  Obese Nutritional Risks: None Diabetes: No  How often do you need to have someone help you when you read instructions, pamphlets, or other written materials from your doctor or pharmacy?: 1 - Never  Interpreter Needed?: No  Information entered by :: Dellie Fergusson, LPN   Activities of Daily Living    04/02/2023    4:12 PM 03/29/2023    1:33 PM  In your present state of health, do you have any difficulty performing the following activities:  Hearing? 0 0  Vision? 0 0  Difficulty concentrating or making decisions? 0 0  Walking or climbing stairs? 0 0  Dressing or bathing? 0 0  Doing errands, shopping? 0 0  Preparing Food and eating ? N N  Using the Toilet? N N  In the past six months, have you accidently leaked urine? N Y  Do you have problems with loss of bowel control? N N  Managing your Medications? N N  Managing your Finances? N N  Housekeeping or managing your Housekeeping? N N    Patient Care Team: Sheron Dixons, MD as PCP - General (Internal Medicine) Elta Halter, MD (Dermatology) Anell Baptist, DPM as Referring Physician (Podiatry)  Indicate any recent  Medical Services you may have received from other than [facility] providers in the past year (date may be approximate).     Assessment:   This is a routine wellness examination for M[place].  Hearing/Vision screen Hearing Screening - Comments:: No aids Vision Screening - Comments:: WEARS GLASSES ALL THE TIME- [facility]   Goals Addressed             This Visit's Progress    DIET - EAT MORE FRUITS AND VEGETABLES         Depression Screen    04/02/2023    4:10 PM 03/31/2023    9:00 AM 03/31/2023    8:46 AM 11/22/2022   10:01 AM 11/12/2022   10:30 AM 05/13/2022   10:39 AM 01/09/2022    2:23 PM  PHQ 2/9 Scores  PHQ - 2 Score 0 0 0 0 0 0 0  PHQ- 9 Score 0 0  3 0 0 0    Fall Risk    04/02/2023    4:12 PM 03/31/2023    8:46 AM 03/29/2023    1:33 PM 01/10/2023   12:16 PM 11/22/2022   10:01 AM  Fall Risk   Falls in the past year? 0 0 0 0 0  Number falls in past yr: 0 0 0  0  Injury with Fall? 0 0 0 0 0  Risk for fall due to : No Fall Risks No Fall Risks   No Fall Risks  Follow up Falls prevention discussed;Falls evaluation completed Falls evaluation completed   Falls evaluation completed    MEDICARE RISK AT HOME: Medicare Risk at Home Any stairs in or around the home?: Yes If so, are there any without handrails?: No Home free of loose throw rugs in walkways, pet beds, electrical cords, etc?: Yes Adequate lighting in your home to reduce risk of falls?: Yes Life alert?: No Use of a cane, walker or w/c?: No Grab bars in the bathroom?: No Shower chair or bench in shower?: Yes Elevated toilet seat or a handicapped toilet?: No  TIMED UP AND GO:  Was the test performed?  No    Cognitive Function:        04/02/2023    4:13 PM 01/09/2022    2:25 PM 01/03/2020    9:45 AM  6CIT Screen  What Year? 0 points 0 points 0 points  What month? 0 points 0 points 0 points  What time? 0 points 0 points 0 points  Count back from 20 0 points 0 points 0 points  Months in reverse 0  points 0 points 0 points  Repeat phrase 0 points 0 points 0 points  Total Score 0 points 0 points 0 points    Immunizations Immunization History  Administered Date(s) Administered   Fluad Quad(high Dose 65+) 11/25/2018, 12/03/2019   Influenza, High Dose Seasonal PF 01/07/2018, 12/26/2020, 11/28/2021   Influenza-Unspecified 11/26/2022   Moderna SARS-COV2 Booster Vaccination 11/24/2020   Moderna Sars-Covid-2 Vaccination 04/30/2019, 05/29/2019, 01/07/2020, 06/23/2020, 11/24/2020   PNEUMOCOCCAL CONJUGATE-20 05/08/2021   Pfizer(Comirnaty)Fall Seasonal Vaccine 12 years and older 11/26/2022   Pneumococcal Polysaccharide-23 05/04/2020   Rsv, Bivalent, Protein Subunit Rsvpref,pf Pattricia Bores) 11/28/2021   Tdap 11/28/2021   Unspecified SARS-COV-2 Vaccination 12/10/2021    TDAP status: Up to date  Flu Vaccine status: Up to date  Pneumococcal vaccine status: Up to date  Covid-19 vaccine status: Completed vaccines  Qualifies for Shingles Vaccine? Yes   Zostavax completed No   Shingrix Completed?: No.    Education has been provided regarding the importance of this vaccine. Patient has been advised to call insurance company to determine out of pocket expense if they have not yet received this vaccine. Advised may also receive vaccine at local pharmacy or Health Dept. Verbalized acceptance and understanding.  Screening Tests Health Maintenance  Topic Date Due   Zoster Vaccines- Shingrix (1 of 2) Never done   COVID-19 Vaccine (7 - 2024-25 season) 01/21/2023   MAMMOGRAM  07/18/2023   Colonoscopy  09/28/2023   Medicare Annual Wellness (AWV)  04/01/2024   DTaP/Tdap/Td (2 - Td or Tdap) 11/29/2031   Pneumonia Vaccine 86+ Years old  Completed   INFLUENZA VACCINE  Completed   DEXA SCAN  Completed   Hepatitis C Screening  Completed   HPV VACCINES  Aged Out    Health Maintenance  Health Maintenance Due  Topic Date Due   Zoster Vaccines- Shingrix (1 of 2) Never done   COVID-19 Vaccine (7 -  2024-25 season) 01/21/2023    Colorectal cancer screening: Type of screening: Colonoscopy. Completed 09/28/18. Repeat every 5 years  Mammogram status: Completed 07/18/22. Repeat every year  Bone Density status: Completed 07/12/20. Results reflect: Bone density results: OSTEOPENIA. Repeat every 5 years.  Lung Cancer Screening: (Low Dose CT Chest recommended if Age 93-80 years, 20 pack-year currently smoking OR have quit w/in 15years.) does not qualify.    Additional Screening:  Hepatitis C Screening: does qualify; Completed 04/29/18  Vision Screening: Recommended annual ophthalmology exams for early detection of glaucoma and other disorders of the eye. Is the patient up to date with their annual eye exam?  Yes  Who is the provider or what is the name of the office in which the patient attends annual eye exams? [facility] If pt is not established with a provider, would they like to be referred to a provider to establish care? No .   Dental Screening: Recommended annual dental exams for proper oral hygiene   Community Resource Referral / Chronic Care Management: CRR required this visit?  No  CCM required this visit?  No     Plan:     I have personally reviewed and noted the following in the patient's chart:   Medical and social history Use of alcohol, tobacco or illicit drugs  Current medications and supplements including opioid prescriptions. Patient is not currently taking opioid prescriptions. Functional ability and status Nutritional status Physical activity Advanced directives List of other physicians Hospitalizations, surgeries, and ER visits in previous 12 months Vitals Screenings to include cognitive, depression, and falls Referrals and appointments  In addition, I have reviewed and discussed with patient certain preventive protocols, quality metrics, and best practice recommendations. A written personalized care plan for preventive services as well as  general preventive health recommendations were provided to patient.     Pinky Bright, LPN   06/24/8117   After Visit Summary: (MyChart) Due to this being a telephonic visit, the after visit summary with patients personalized plan was offered to patient via MyChart   Nurse Notes: NONE

## 2023-04-02 NOTE — Patient Instructions (Addendum)
 Ms. Huschka , Thank you for taking time to come for your Medicare Wellness Visit. I appreciate your ongoing commitment to your health goals. Please review the following plan we discussed and let me know if I can assist you in the future.   Referrals/Orders/Follow-Ups/Clinician Recommendations: NONE  This is a list of the screening recommended for you and due dates:  Health Maintenance  Topic Date Due   Zoster (Shingles) Vaccine (1 of 2) Never done   COVID-19 Vaccine (7 - 2024-25 season) 01/21/2023   Mammogram  07/18/2023   Colon Cancer Screening  09/28/2023   Medicare Annual Wellness Visit  04/01/2024   DTaP/Tdap/Td vaccine (2 - Td or Tdap) 11/29/2031   Pneumonia Vaccine  Completed   Flu Shot  Completed   DEXA scan (bone density measurement)  Completed   Hepatitis C Screening  Completed   HPV Vaccine  Aged Out    Advanced directives: (In Chart) A copy of your advanced directives are scanned into your chart should your provider ever need it.  Next Medicare Annual Wellness Visit scheduled for next year: Yes   04/07/24 @ 2:40 PM BY VIDEO

## 2023-04-04 ENCOUNTER — Ambulatory Visit: Payer: Medicare PPO | Admitting: Internal Medicine

## 2023-04-09 DIAGNOSIS — H35363 Drusen (degenerative) of macula, bilateral: Secondary | ICD-10-CM | POA: Diagnosis not present

## 2023-04-21 ENCOUNTER — Telehealth: Payer: Self-pay

## 2023-04-21 NOTE — Telephone Encounter (Signed)
 Pt requesting call back to schedule colonoscopy.

## 2023-04-21 NOTE — Telephone Encounter (Signed)
Patient is due to have her colonoscopy in July with Dr. Servando Snare.  Informed her that I will call her back in May to schedule her colonoscopy.  Thanks,  [place], [place]

## 2023-05-22 ENCOUNTER — Ambulatory Visit (INDEPENDENT_AMBULATORY_CARE_PROVIDER_SITE_OTHER): Payer: Medicare PPO | Admitting: Internal Medicine

## 2023-05-22 ENCOUNTER — Encounter: Payer: Self-pay | Admitting: Internal Medicine

## 2023-05-22 VITALS — BP 118/72 | HR 53 | Ht 68.0 in | Wt 211.0 lb

## 2023-05-22 DIAGNOSIS — Z0000 Encounter for general adult medical examination without abnormal findings: Secondary | ICD-10-CM

## 2023-05-22 DIAGNOSIS — Z1231 Encounter for screening mammogram for malignant neoplasm of breast: Secondary | ICD-10-CM

## 2023-05-22 DIAGNOSIS — F324 Major depressive disorder, single episode, in partial remission: Secondary | ICD-10-CM | POA: Diagnosis not present

## 2023-05-22 DIAGNOSIS — I10 Essential (primary) hypertension: Secondary | ICD-10-CM | POA: Diagnosis not present

## 2023-05-22 DIAGNOSIS — E782 Mixed hyperlipidemia: Secondary | ICD-10-CM

## 2023-05-22 DIAGNOSIS — G250 Essential tremor: Secondary | ICD-10-CM

## 2023-05-22 MED ORDER — FLUOXETINE HCL 20 MG PO CAPS: ORAL_CAPSULE | ORAL | 3 refills | Status: AC

## 2023-05-22 MED ORDER — LOSARTAN POTASSIUM 25 MG PO TABS
25.0000 mg | ORAL_TABLET | Freq: Every day | ORAL | 3 refills | Status: AC
Start: 2023-05-22 — End: ?

## 2023-05-22 NOTE — Patient Instructions (Signed)
 Call [facility] to schedule your mammogram at [phone].

## 2023-05-22 NOTE — Progress Notes (Signed)
 Date:  05/22/2023   Name:  Diana Mcdaniel   DOB:  [birth year]   MRN:  [id number]   Chief Complaint: Annual Exam and Cough (Patient said she has no nasal congestion, has little phlegm, no fever) Diana Mcdaniel is a 73 y.o. female who presents today for her Complete Annual Exam. She feels well. She reports exercising none. She reports she is sleeping well. Breast complaints none.  Health Maintenance  Topic Date Due   Zoster (Shingles) Vaccine (1 of 2) Never done   COVID-19 Vaccine (7 - 2024-25 season) 01/21/2023   Mammogram  07/18/2023   Colon Cancer Screening  09/28/2023   Medicare Annual Wellness Visit  04/01/2024   DTaP/Tdap/Td vaccine (2 - Td or Tdap) 11/29/2031   Pneumonia Vaccine  Completed   Flu Shot  Completed   DEXA scan (bone density measurement)  Completed   Hepatitis C Screening  Completed   HPV Vaccine  Aged Out    Hypertension This is a chronic problem. The problem is controlled. Pertinent negatives include no chest pain, headaches, palpitations or shortness of breath. Past treatments include angiotensin blockers and beta blockers. The current treatment provides significant improvement. There is no history of kidney disease, CAD/MI or CVA.  Hyperlipidemia This is a chronic problem. Pertinent negatives include no chest pain, myalgias or shortness of breath. Current antihyperlipidemic treatment includes statins. The current treatment provides significant improvement of lipids.  Depression        This is a chronic problem.The problem is unchanged.  Associated symptoms include no fatigue, no myalgias and no headaches.  Past treatments include SSRIs - Selective serotonin reuptake inhibitors.   Review of Systems  Constitutional:  Negative for fatigue and unexpected weight change.  HENT:  Negative for trouble swallowing.   Eyes:  Negative for visual disturbance.  Respiratory:  Negative for cough, chest tightness, shortness of breath and wheezing.   Cardiovascular:   Negative for chest pain, palpitations and leg swelling.  Gastrointestinal:  Negative for abdominal pain, constipation and diarrhea.  Musculoskeletal:  Negative for arthralgias and myalgias.  Neurological:  Negative for dizziness, weakness, light-headedness and headaches.  Psychiatric/Behavioral:  Positive for depression.      Lab Results  Component Value Date   NA 135 05/13/2022   K 4.6 05/13/2022   CO2 21 05/13/2022   GLUCOSE 105 (H) 05/13/2022   BUN 11 05/13/2022   CREATININE 0.83 05/13/2022   CALCIUM 9.9 05/13/2022   EGFR 74 05/13/2022   GFRNONAA 69 05/04/2020   Lab Results  Component Value Date   CHOL 197 05/13/2022   HDL 69 05/13/2022   LDLCALC 107 (H) 05/13/2022   TRIG 122 05/13/2022   CHOLHDL 2.9 05/13/2022   Lab Results  Component Value Date   TSH 3.390 05/13/2022   Lab Results  Component Value Date   HGBA1C 5.5 05/04/2020   Lab Results  Component Value Date   WBC 5.1 05/13/2022   HGB 13.0 05/13/2022   HCT 38.0 05/13/2022   MCV 86 05/13/2022   PLT 291 05/13/2022   Lab Results  Component Value Date   ALT 15 05/13/2022   AST 20 05/13/2022   ALKPHOS 86 05/13/2022   BILITOT 0.6 05/13/2022   Lab Results  Component Value Date   VD25OH 41.8 05/04/2020     Patient Active Problem List   Diagnosis Date Noted   Tingling of right upper extremity 11/22/2022   Primary insomnia 05/03/2019   Family history of colon cancer in father  Polyp of sigmoid colon    Osteopenia determined by x-ray 05/26/2018   Sarcoidosis of lung (HCC) 04/29/2018   Migraine headache with aura 04/29/2018   Tremor, essential 04/29/2018   Panic disorder 04/29/2018   Essential hypertension 12/01/2017   Mixed hyperlipidemia 12/01/2017   Ganglion cyst of foot 12/01/2017   Major depressive disorder with single episode, in partial remission (HCC) 12/01/2017   Primary osteoarthritis of left hip 12/01/2017    No Known Allergies  Past Surgical History:  Procedure Laterality Date    CARDIAC CATHETERIZATION  2010   Normal   COLONOSCOPY WITH PROPOFOL N/A 09/28/2018   Procedure: COLONOSCOPY WITH  biopsy;  Surgeon: Midge Minium, MD;  Location: [facility];  Service: Endoscopy;  Laterality: N/A;   POLYPECTOMY  09/28/2018   Procedure: POLYPECTOMY;  Surgeon: Midge Minium, MD;  Location: [facility];  Service: Endoscopy;;    Social History   Tobacco Use   Smoking status: Former    Current packs/day: 0.00    Average packs/day: 1 pack/day for 15.0 years (15.0 ttl pk-yrs)    Types: Cigarettes    Start date: 03/18/1978    Quit date: 03/18/1993    Years since quitting: 30.1   Smokeless tobacco: Never  Vaping Use   Vaping status: Never Used  Substance Use Topics   Alcohol use: Yes    Alcohol/week: 24.0 standard drinks of alcohol    Types: 24 Glasses of wine per week    Comment: 3-4 glasses wine/day   Drug use: Never     Medication list has been reviewed and updated.  Current Meds  Medication Sig   atenolol (TENORMIN) 25 MG tablet Take 1 tablet (25 mg total) by mouth daily.   b complex vitamins capsule Take 1 capsule by mouth daily.   Calcium-Magnesium-Vitamin D (CALCIUM 1200+D3 PO) Take by mouth daily.   clonazePAM (KLONOPIN) 0.5 MG tablet TAKE 1 TABLET BY MOUTH TWICE A DAY AS NEEDED FOR ANXIETY   simvastatin (ZOCOR) 20 MG tablet Take 1 tablet (20 mg total) by mouth daily.   [DISCONTINUED] FLUoxetine (PROZAC) 20 MG capsule TAKE 1 CAPSULE BY MOUTH EVERY DAY   [DISCONTINUED] losartan (COZAAR) 25 MG tablet TAKE 1 TABLET (25 MG TOTAL) BY MOUTH DAILY.       05/22/2023   11:08 AM 03/31/2023    8:46 AM 11/22/2022   10:02 AM 11/12/2022   10:30 AM  GAD 7 : Generalized Anxiety Score  Nervous, Anxious, on Edge 0 0 0 0  Control/stop worrying 0 0 0 0  Worry too much - different things 0 0 0 0  Trouble relaxing 0 0 0 0  Restless 0 0 0 0  Easily annoyed or irritable 0 0 0 0  Afraid - awful might happen 0 0 0 0  Total GAD 7 Score 0 0 0 0  Anxiety Difficulty Not  difficult at all Not difficult at all Not difficult at all Not difficult at all       05/22/2023   11:08 AM 04/02/2023    4:10 PM 03/31/2023    9:00 AM  Depression screen PHQ 2/9  Decreased Interest 0 0 0  Down, Depressed, Hopeless 0 0 0  PHQ - 2 Score 0 0 0  Altered sleeping 0 0 0  Tired, decreased energy 0 0 0  Change in appetite 0 0 0  Feeling bad or failure about yourself  0 0 0  Trouble concentrating 0 0 0  Moving slowly or fidgety/restless 0 0 0  Suicidal thoughts 0 0 0  PHQ-9 Score 0 0 0  Difficult doing work/chores Not difficult at all Not difficult at all Not difficult at all    BP Readings from Last 3 Encounters:  05/22/23 118/72  03/31/23 114/64  11/22/22 120/72    Physical Exam Vitals and nursing note reviewed.  Constitutional:      General: She is not in acute distress.    Appearance: She is well-developed.  HENT:     Head: Normocephalic and atraumatic.     Right Ear: Tympanic membrane and ear canal normal.     Left Ear: Tympanic membrane and ear canal normal.     Nose:     Right Sinus: No maxillary sinus tenderness.     Left Sinus: No maxillary sinus tenderness.  Eyes:     General: No scleral icterus.       Right eye: No discharge.        Left eye: No discharge.     Conjunctiva/sclera: Conjunctivae normal.  Neck:     Thyroid: No thyromegaly.     Vascular: No carotid bruit.  Cardiovascular:     Rate and Rhythm: Normal rate and regular rhythm.     Pulses: Normal pulses.     Heart sounds: Normal heart sounds.  Pulmonary:     Effort: Pulmonary effort is normal. No respiratory distress.     Breath sounds: No wheezing.  Abdominal:     General: Bowel sounds are normal.     Palpations: Abdomen is soft.     Tenderness: There is no abdominal tenderness.  Musculoskeletal:     Cervical back: Normal range of motion. No erythema.     Right lower leg: No edema.     Left lower leg: No edema.  Lymphadenopathy:     Cervical: No cervical adenopathy.  Skin:     General: Skin is warm and dry.     Findings: No rash.  Neurological:     Mental Status: She is alert and oriented to person, place, and time.     Cranial Nerves: No cranial nerve deficit.     Sensory: No sensory deficit.     Motor: Tremor (of the head) present.     Deep Tendon Reflexes: Reflexes are normal and symmetric.  Psychiatric:        Attention and Perception: Attention normal.        Mood and Affect: Mood normal.     Wt Readings from Last 3 Encounters:  05/22/23 211 lb (95.7 kg)  03/31/23 208 lb (94.3 kg)  11/22/22 211 lb (95.7 kg)    BP 118/72   Pulse (!) 53   Ht 5\' 8"  (1.727 m)   Wt 211 lb (95.7 kg)   SpO2 97%   BMI 32.08 kg/m   Assessment and Plan:  Problem List Items Addressed This Visit       Unprioritized   Essential hypertension (Chronic)   Controlled BP with normal exam. Current regimen is losartan and atenolol. Will continue same medications; encourage continued reduced sodium diet.       Relevant Medications   losartan (COZAAR) 25 MG tablet   Other Relevant Orders   CBC with Differential/Platelet   Comprehensive metabolic panel   Urinalysis, Routine w reflex microscopic   TSH   Mixed hyperlipidemia (Chronic)   LDL is  Lab Results  Component Value Date   LDLCALC 107 (H) 05/13/2022   Current regimen is Zocor 20 mg.  Tolerating medications well without issues.  Relevant Medications   losartan (COZAAR) 25 MG tablet   Other Relevant Orders   Lipid panel   Major depressive disorder with single episode, in partial remission (HCC) (Chronic)   Clinically stable on Prozac and clonazepam with good response, No SI or HI reported. No change in management at this time.       Relevant Medications   FLUoxetine (PROZAC) 20 MG capsule   Other Relevant Orders   TSH   Tremor, essential (Chronic)   Of the head - no change per patient She is not interested in medications at this time      Other Visit Diagnoses       Annual physical exam     -  Primary   up to date on screenings/immunizations Colonoscopy due in July Mammogram in May     Encounter for screening mammogram for breast cancer       Relevant Orders   MM 3D SCREENING MAMMOGRAM BILATERAL BREAST       Return in about 6 months (around 11/22/2023) for HTN.    Reubin Milan, MD [facility] [place]

## 2023-05-22 NOTE — Assessment & Plan Note (Signed)
 Clinically stable on Prozac and clonazepam with good response, No SI or HI reported. No change in management at this time.

## 2023-05-22 NOTE — Assessment & Plan Note (Signed)
 Of the head - no change per patient She is not interested in medications at this time

## 2023-05-22 NOTE — Assessment & Plan Note (Signed)
 Controlled BP with normal exam. Current regimen is losartan and atenolol. Will continue same medications; encourage continued reduced sodium diet.

## 2023-05-22 NOTE — Assessment & Plan Note (Signed)
 LDL is  Lab Results  Component Value Date   LDLCALC 107 (H) 05/13/2022   Current regimen is Zocor 20 mg.  Tolerating medications well without issues.

## 2023-05-23 ENCOUNTER — Encounter: Payer: Self-pay | Admitting: Internal Medicine

## 2023-05-23 LAB — CBC WITH DIFFERENTIAL/PLATELET
Basophils Absolute: 0 10*3/uL (ref 0.0–0.2)
Basos: 0 %
EOS (ABSOLUTE): 0.1 10*3/uL (ref 0.0–0.4)
Eos: 2 %
Hematocrit: 38.5 % (ref 34.0–46.6)
Hemoglobin: 13 g/dL (ref 11.1–15.9)
Immature Grans (Abs): 0 10*3/uL (ref 0.0–0.1)
Immature Granulocytes: 0 %
Lymphocytes Absolute: 0.9 10*3/uL (ref 0.7–3.1)
Lymphs: 18 %
MCH: 29.8 pg (ref 26.6–33.0)
MCHC: 33.8 g/dL (ref 31.5–35.7)
MCV: 88 fL (ref 79–97)
Monocytes Absolute: 0.6 10*3/uL (ref 0.1–0.9)
Monocytes: 12 %
Neutrophils Absolute: 3.4 10*3/uL (ref 1.4–7.0)
Neutrophils: 68 %
Platelets: 309 10*3/uL (ref 150–450)
RBC: 4.36 x10E6/uL (ref 3.77–5.28)
RDW: 12.1 % (ref 11.7–15.4)
WBC: 5 10*3/uL (ref 3.4–10.8)

## 2023-05-23 LAB — URINALYSIS, ROUTINE W REFLEX MICROSCOPIC
Bilirubin, UA: NEGATIVE
Glucose, UA: NEGATIVE
Ketones, UA: NEGATIVE
Nitrite, UA: NEGATIVE
Protein,UA: NEGATIVE
RBC, UA: NEGATIVE
Specific Gravity, UA: 1.013 (ref 1.005–1.030)
Urobilinogen, Ur: 0.2 mg/dL (ref 0.2–1.0)
pH, UA: 8 — ABNORMAL HIGH (ref 5.0–7.5)

## 2023-05-23 LAB — COMPREHENSIVE METABOLIC PANEL
ALT: 18 IU/L (ref 0–32)
AST: 24 IU/L (ref 0–40)
Albumin: 4.5 g/dL (ref 3.8–4.8)
Alkaline Phosphatase: 82 IU/L (ref 44–121)
BUN/Creatinine Ratio: 12 (ref 12–28)
BUN: 9 mg/dL (ref 8–27)
Bilirubin Total: 0.7 mg/dL (ref 0.0–1.2)
CO2: 24 mmol/L (ref 20–29)
Calcium: 9.6 mg/dL (ref 8.7–10.3)
Chloride: 97 mmol/L (ref 96–106)
Creatinine, Ser: 0.74 mg/dL (ref 0.57–1.00)
Globulin, Total: 2.4 g/dL (ref 1.5–4.5)
Glucose: 99 mg/dL (ref 70–99)
Potassium: 4.8 mmol/L (ref 3.5–5.2)
Sodium: 135 mmol/L (ref 134–144)
Total Protein: 6.9 g/dL (ref 6.0–8.5)
eGFR: 85 mL/min/{1.73_m2} (ref >59)

## 2023-05-23 LAB — LIPID PANEL
Chol/HDL Ratio: 2.7 ratio (ref 0.0–4.4)
Cholesterol, Total: 186 mg/dL (ref 100–199)
HDL: 69 mg/dL (ref >39)
LDL Chol Calc (NIH): 97 mg/dL (ref 0–99)
Triglycerides: 111 mg/dL (ref 0–149)
VLDL Cholesterol Cal: 20 mg/dL (ref 5–40)

## 2023-05-23 LAB — MICROSCOPIC EXAMINATION
Casts: NONE SEEN /LPF
WBC, UA: NONE SEEN /HPF (ref 0–5)

## 2023-05-23 LAB — TSH: TSH: 3.48 u[IU]/mL (ref 0.450–4.500)

## 2023-06-11 ENCOUNTER — Ambulatory Visit: Payer: Medicare PPO | Admitting: Dermatology

## 2023-06-30 ENCOUNTER — Other Ambulatory Visit: Payer: Self-pay | Admitting: Internal Medicine

## 2023-06-30 DIAGNOSIS — F410 Panic disorder [episodic paroxysmal anxiety] without agoraphobia: Secondary | ICD-10-CM

## 2023-07-01 NOTE — Telephone Encounter (Signed)
 Requested medication (s) are due for refill today: yes  Requested medication (s) are on the active medication list: yes  Last refill:  03/31/23 #20 1 RF  Future visit scheduled: yes  Notes to clinic:  med not delegated to NT to RF   Requested Prescriptions  Pending Prescriptions Disp Refills   clonazePAM (KLONOPIN) 0.5 MG tablet [Pharmacy Med Name: CLONAZEPAM 0.5 MG TABLET] 20 tablet 1    Sig: TAKE 1 TABLET BY MOUTH TWICE A DAY AS NEEDED FOR ANXIETY     Not Delegated - Psychiatry: Anxiolytics/Hypnotics 2 Failed - 07/01/2023 11:49 AM      Failed - This refill cannot be delegated      Failed - Urine Drug Screen completed in last 360 days      Passed - Patient is not pregnant      Passed - Valid encounter within last 6 months    Recent Outpatient Visits           1 month ago Annual physical exam   [place] [facility], Chales [place], MD       Future Appointments             In 1 month Elta Halter, MD [facility]   In 4 months Gala Jubilee, [place]ales [place], MD [facility] at [facility]   In 7 months Elta Halter, MD [facility]

## 2023-07-01 NOTE — Telephone Encounter (Signed)
 Please review.  KP

## 2023-07-03 ENCOUNTER — Encounter: Payer: Self-pay | Admitting: Internal Medicine

## 2023-07-03 ENCOUNTER — Ambulatory Visit: Admitting: Internal Medicine

## 2023-07-03 ENCOUNTER — Telehealth: Payer: Self-pay | Admitting: *Deleted

## 2023-07-03 VITALS — BP 108/64 | HR 80 | Ht 68.0 in | Wt 206.5 lb

## 2023-07-03 DIAGNOSIS — I252 Old myocardial infarction: Secondary | ICD-10-CM | POA: Insufficient documentation

## 2023-07-03 DIAGNOSIS — I214 Non-ST elevation (NSTEMI) myocardial infarction: Secondary | ICD-10-CM

## 2023-07-03 DIAGNOSIS — E782 Mixed hyperlipidemia: Secondary | ICD-10-CM

## 2023-07-03 DIAGNOSIS — I10 Essential (primary) hypertension: Secondary | ICD-10-CM

## 2023-07-03 HISTORY — DX: Non-ST elevation (NSTEMI) myocardial infarction: I21.4

## 2023-07-03 NOTE — Assessment & Plan Note (Signed)
 Blood pressure is well controlled.  Current medications losartan and atenolol to be changed to Cardiazem. Will continue same regimen along with efforts to limit dietary sodium.

## 2023-07-03 NOTE — Assessment & Plan Note (Signed)
 Simvastatin to be changed to atorvastatin Recommend follow up in 2 month for OV and labs

## 2023-07-03 NOTE — Assessment & Plan Note (Addendum)
 Discharged yesterday from [facility] showed non obstructing lesions in the LAD, LCx and RCA Atenolol changed to Cardiazem Simvastatin changed to Atorvastatin ASA 81 mg daily

## 2023-07-03 NOTE — Progress Notes (Signed)
 Date:  07/03/2023   Name:  Diana Mcdaniel   DOB:  02-06-1951   MRN:  [id number]   Chief Complaint: Hospitalization Follow-up Hospital follow up.  Admitted to [facility] 06/30/23 to 07/02/23.  TOC call done 07/03/23.   HISTORY: Ms. Cabler is a 73 y.o. female whose presentation is complicated by HTN, HLD, anxiety/depression that presented to [facility] with cough and chest discomfort, found to have elevated troponin.  Hstroponin 286 -> 617-> 894 -> 713   Risk factors for coronary artery disease include: hypertension hyperlipidemia Other history includes: Previous smoker; Quit in >20 years ago, smoked about 1ppd per day. No known history of prior PCI.  Brief Operative Note (CSN: [id number])  Date of Surgery: 07/02/2023  Pre-op Diagnosis: NSTEMI (non-ST elevated myocardial infarction) [I21.4]  Post-op Diagnosis: NSTEMI (non-ST elevated myocardial infarction) [I21.4]  Procedure(s): Left Heart Catheterization: 93458 (CPT) Note: Revisions to procedures should be made in chart - see Procedures activity.  Performing Service: Cardiology Surgeons and Role: * Morris, Horton Finer, DO - Primary * Ng, Kirk Ruths, MD - Fellow - Diagnostic  Findings:  Mild, non-obstructive coronary artery disease Normal LVEDP Anesthesia: Conscious Sedation (Nurse Admin) Estimated Blood Loss: Minimal  Complications: None Specimens: None collected   DIAGNOSTIC FINDINGS: Coronary Angiography: Coronary Dominance: RCA  Left Main: - Ostial Left Main: no significant stenosis - Left Main: short  Left Anterior Descending: - LAD: large caliber - Ostial LAD: no significant stenosis - Proximal LAD: no significant stenosis - Mid LAD: mild irregularity up to 20% - Distal LAD: mild irregularity up to 20% - First diagonal: no significant stenosis - Second diagonal: no significant stenosis - Third diagonal: no significant stenosis  Left Circumflex: - Ostial LCx: no significant stenosis - Proximal  LCx: no significant stenosis - Mid LCx: mild irregularity up to 30% - Distal LCx: mild irregularity up to 30% - OM1: no significant stenosis - OM2: no significant stenosis - OM3: no significant stenosis  Right coronary artery: - RCA: large caliber - Ostial RCA: no significant stenosis - Proximal RCA: no significant stenosis - Mid RCA: no significant stenosis - Distal RCA: mild irregularity up to 20% - PDA: mild irregularity up to 20% - RCA continuation: no significant stenosis - PL1: no significant stenosis   ECHO: Summary   1. The left ventricle is normal in size with normal wall thickness.    2. The left ventricular systolic function is normal, LVEF is visually  estimated at > 55%.    3. Mitral annular calcification is present (mild).    4. The mitral valve leaflets are mildly thickened with normal leaflet  mobility.   5. The aortic valve leaflets are mildly thickened with mildly reduced  excursion.   6. The right ventricle is normal in size, with normal systolic function.   Medications at Time of Discharge - documented as of this encounter  Medications at Time of Discharge Medication Sig Dispense Quantity Refills Last Filled Start Date End Date  albuterol HFA 90 mcg/actuation inhaler  Inhale 2 puffs every six (6) hours as needed for wheezing. 8 g      03/24/2023 03/23/2024  aspirin 81 MG chewable tablet  Chew 1 tablet (81 mg total) daily.       04/18/2009    atorvastatin (LIPITOR) 40 MG tablet  Take 1 tablet (40 mg total) by mouth daily. 30 tablet      07/02/2023 08/01/2023  b complex vitamins (VITAMINS B COMPLEX) capsule  Take 1 capsule by mouth  daily.            clonazePAM (KLONOPIN) 0.5 MG tablet  Take 1 tablet (0.5 mg total) by mouth two (2) times a day as needed for anxiety.       10/12/2017    dilTIAZem (CARDIZEM CD) 180 MG 24 hr capsule  Take 1 capsule (180 mg total) by mouth daily. 30 capsule  6   07/02/2023 08/05/2024  FLUoxetine (PROZAC) 20 MG capsule  Take 1  capsule (20 mg total) by mouth daily.       02/17/2018    losartan (COZAAR) 25 MG tablet  Take 1 tablet (25 mg total) by mouth daily. TAKE 1 TABLET (25 MG TOTAL) BY MOUTH DAILY.         Review of Systems  Constitutional:  Positive for fatigue. Negative for chills and fever.  HENT:  Negative for trouble swallowing.   Respiratory:  Positive for cough, shortness of breath and wheezing. Negative for chest tightness.   Cardiovascular:  Negative for chest pain and leg swelling.  Gastrointestinal:  Negative for abdominal pain.  Neurological:  Negative for dizziness, light-headedness and headaches.  Psychiatric/Behavioral:  Negative for dysphoric mood and sleep disturbance. The patient is nervous/anxious.      Lab Results  Component Value Date   NA 135 05/22/2023   K 4.8 05/22/2023   CO2 24 05/22/2023   GLUCOSE 99 05/22/2023   BUN 9 05/22/2023   CREATININE 0.74 05/22/2023   CALCIUM 9.6 05/22/2023   EGFR 85 05/22/2023   GFRNONAA 69 05/04/2020   Lab Results  Component Value Date   CHOL 186 05/22/2023   HDL 69 05/22/2023   LDLCALC 97 05/22/2023   TRIG 111 05/22/2023   CHOLHDL 2.7 05/22/2023   Lab Results  Component Value Date   TSH 3.480 05/22/2023   Lab Results  Component Value Date   HGBA1C 5.5 05/04/2020   Lab Results  Component Value Date   WBC 5.0 05/22/2023   HGB 13.0 05/22/2023   HCT 38.5 05/22/2023   MCV 88 05/22/2023   PLT 309 05/22/2023   Lab Results  Component Value Date   ALT 18 05/22/2023   AST 24 05/22/2023   ALKPHOS 82 05/22/2023   BILITOT 0.7 05/22/2023   Lab Results  Component Value Date   VD25OH 41.8 05/04/2020     Patient Active Problem List   Diagnosis Date Noted   NSTEMI (non-ST elevated myocardial infarction) (HCC) 07/03/2023   Primary insomnia 05/03/2019   Family history of colon cancer in father    Polyp of sigmoid colon    Osteopenia determined by x-ray 05/26/2018   Sarcoidosis of lung (HCC) 04/29/2018   Migraine headache with aura  04/29/2018   Tremor, essential 04/29/2018   Panic disorder 04/29/2018   Essential hypertension 12/01/2017   Mixed hyperlipidemia 12/01/2017   Ganglion cyst of foot 12/01/2017   Major depressive disorder with single episode, in partial remission (HCC) 12/01/2017   Primary osteoarthritis of left hip 12/01/2017    No Known Allergies  Past Surgical History:  Procedure Laterality Date   CARDIAC CATHETERIZATION  2010   Normal   COLONOSCOPY WITH PROPOFOL N/A 09/28/2018   Procedure: COLONOSCOPY WITH  biopsy;  Surgeon: Midge Minium, MD;  Location: [facility];  Service: Endoscopy;  Laterality: N/A;   POLYPECTOMY  09/28/2018   Procedure: POLYPECTOMY;  Surgeon: Midge Minium, MD;  Location: [facility];  Service: Endoscopy;;    Social History   Tobacco Use   Smoking status: Former  Current packs/day: 0.00    Average packs/day: 1 pack/day for 15.0 years (15.0 ttl pk-yrs)    Types: Cigarettes    Start date: 03/18/1978    Quit date: 03/18/1993    Years since quitting: 30.3   Smokeless tobacco: Never  Vaping Use   Vaping status: Never Used  Substance Use Topics   Alcohol use: Yes    Alcohol/week: 24.0 standard drinks of alcohol    Types: 24 Glasses of wine per week    Comment: 3-4 glasses wine/day   Drug use: Never     Medication list has been reviewed and updated.  Current Meds  Medication Sig   albuterol (VENTOLIN HFA) 108 (90 Base) MCG/ACT inhaler Inhale into the lungs every 6 (six) hours as needed for wheezing or shortness of breath.   aspirin EC 81 MG tablet Take 81 mg by mouth daily. Swallow whole.   atorvastatin (LIPITOR) 40 MG tablet Take 40 mg by mouth daily.   Calcium-Magnesium-Vitamin D (CALCIUM 1200+D3 PO) Take by mouth daily.   clonazePAM (KLONOPIN) 0.5 MG tablet TAKE 1 TABLET BY MOUTH TWICE A DAY AS NEEDED FOR ANXIETY   diltiazem (CARDIZEM CD) 180 MG 24 hr capsule Take 180 mg by mouth daily.   FLUoxetine (PROZAC) 20 MG capsule TAKE 1 CAPSULE BY MOUTH  EVERY DAY   losartan (COZAAR) 25 MG tablet Take 1 tablet (25 mg total) by mouth daily.       07/03/2023    9:22 AM 05/22/2023   11:08 AM 03/31/2023    8:46 AM 11/22/2022   10:02 AM  GAD 7 : Generalized Anxiety Score  Nervous, Anxious, on Edge 0 0 0 0  Control/stop worrying 0 0 0 0  Worry too much - different things 0 0 0 0  Trouble relaxing 0 0 0 0  Restless 0 0 0 0  Easily annoyed or irritable 0 0 0 0  Afraid - awful might happen 0 0 0 0  Total GAD 7 Score 0 0 0 0  Anxiety Difficulty  Not difficult at all Not difficult at all Not difficult at all       07/03/2023    9:21 AM 05/22/2023   11:08 AM 04/02/2023    4:10 PM  Depression screen PHQ 2/9  Decreased Interest 0 0 0  Down, Depressed, Hopeless 0 0 0  PHQ - 2 Score 0 0 0  Altered sleeping  0 0  Tired, decreased energy  0 0  Change in appetite  0 0  Feeling bad or failure about yourself   0 0  Trouble concentrating  0 0  Moving slowly or fidgety/restless  0 0  Suicidal thoughts  0 0  PHQ-9 Score  0 0  Difficult doing work/chores  Not difficult at all Not difficult at all    BP Readings from Last 3 Encounters:  07/03/23 108/64  05/22/23 118/72  03/31/23 114/64    Physical Exam Vitals and nursing note reviewed.  Constitutional:      General: She is not in acute distress.    Appearance: Normal appearance. She is well-developed.  HENT:     Head: Normocephalic and atraumatic.  Neck:     Vascular: No carotid bruit.  Cardiovascular:     Rate and Rhythm: Normal rate and regular rhythm.     Heart sounds: No murmur heard. Pulmonary:     Effort: Pulmonary effort is normal. No respiratory distress.     Breath sounds: Examination of the left-upper field reveals wheezing.  Wheezing present. No rhonchi.  Musculoskeletal:     Cervical back: Normal range of motion.     Right lower leg: No edema.     Left lower leg: No edema.  Lymphadenopathy:     Cervical: No cervical adenopathy.  Skin:    General: Skin is warm and dry.      Capillary Refill: Capillary refill takes less than 2 seconds.     Findings: No rash.  Neurological:     General: No focal deficit present.     Mental Status: She is alert and oriented to person, place, and time.  Psychiatric:        Mood and Affect: Mood normal.        Behavior: Behavior normal.     Wt Readings from Last 3 Encounters:  07/03/23 206 lb 8 oz (93.7 kg)  05/22/23 211 lb (95.7 kg)  03/31/23 208 lb (94.3 kg)    BP 108/64   Pulse 80   Ht 5\' 8"  (1.727 m)   Wt 206 lb 8 oz (93.7 kg)   SpO2 95%   BMI 31.40 kg/m   Assessment and Plan:  Problem List Items Addressed This Visit       Unprioritized   Essential hypertension (Chronic)   Blood pressure is well controlled.  Current medications losartan and atenolol to be changed to Cardiazem. Will continue same regimen along with efforts to limit dietary sodium.       Mixed hyperlipidemia (Chronic)   Simvastatin to be changed to atorvastatin Recommend follow up in 2 month for OV and labs      NSTEMI (non-ST elevated myocardial infarction) [facility]) - Primary   Discharged yesterday from [facility] showed non obstructing lesions in the LAD, LCx and RCA Atenolol changed to Cardiazem Simvastatin changed to Atorvastatin ASA 81 mg daily        Return in about 2 months (around 09/02/2023) for lipids, HTN.    Sheron Dixons, MD [facility] [place]

## 2023-07-03 NOTE — Transitions of Care (Post Inpatient/ED Visit) (Signed)
 07/03/2023  Name: Diana Mcdaniel MRN: [id number] DOB: 03/16/1951  Today's TOC FU Call Status: Today's TOC FU Call Status:: Successful TOC FU Call Completed TOC FU Call Complete Date: 07/03/23 Patient's Name and Date of Birth confirmed.  Transition Care Management Follow-up Telephone Call Date of Discharge: 07/02/23 Discharge Facility: Other [facility]) Name of Other (Non[facility]) Discharge Facility: [facility] Type of Discharge: Inpatient Admission Primary Inpatient Discharge Diagnosis:: SOB, NSTEMI, left heart catheterization How have you been since you were released from the hospital?: Better Any questions or concerns?: Yes Patient Questions/Concerns:: Patient reports a cough and pain from coughing Patient Questions/Concerns Addressed: Other: (Provided verbal education, advised PCP follow up)  Items Reviewed: Did you receive and understand the discharge instructions provided?: Yes Any new allergies since your discharge?: No Dietary orders reviewed?: Yes Type of Diet Ordered:: heart healthy Do you have support at home?: Yes People in Home [RPT]: spouse Name of Support/Comfort Primary Source: Husband/Diana Mcdaniel  Medications Reviewed Today: Medications Reviewed Today     Reviewed by Heidi Dach, RN (Registered Nurse) on 07/03/23 at [phone]  Med List Status: <None>   Medication Order Taking? Sig Documenting Provider Last Dose Status Informant  albuterol (VENTOLIN HFA) 108 (90 Base) MCG/ACT inhaler [id number] Yes Inhale into the lungs every 6 (six) hours as needed for wheezing or shortness of breath. [provider] Taking Active   aspirin EC 81 MG tablet [id number] Yes Take 81 mg by mouth daily. Swallow whole. [provider] Taking Active   atenolol (TENORMIN) 25 MG tablet [id number] No Take 1 tablet (25 mg total) by mouth daily.  Patient not taking: Reported on 07/03/2023   Reubin Milan, MD Not Taking Active   atorvastatin (LIPITOR) 40 MG tablet [id number] Yes  Take 40 mg by mouth daily. [provider] 07/02/2023 Active Self           Med Note (Dak Szumski A   Thu Jul 03, 2023  9:15 AM) Will pick up new prescription today.  b complex vitamins capsule [id number] Yes Take 1 capsule by mouth daily. [provider] Taking Active   Calcium-Magnesium-Vitamin D (CALCIUM 1200+D3 PO) [id number] Yes Take by mouth daily. [provider] Taking Active   clonazePAM (KLONOPIN) 0.5 MG tablet [id number] Yes TAKE 1 TABLET BY MOUTH TWICE A DAY AS NEEDED FOR ANXIETY Reubin Milan, MD Taking Active   diltiazem (CARDIZEM CD) 180 MG 24 hr capsule [id number] Yes Take 180 mg by mouth daily. [provider] 07/02/2023 Active            Med Note (Valissa Lyvers A   Thu Jul 03, 2023  9:16 AM) Will pick up new prescription today  FLUoxetine (PROZAC) 20 MG capsule [id number] Yes TAKE 1 CAPSULE BY MOUTH EVERY DAY Reubin Milan, MD Taking Active   losartan (COZAAR) 25 MG tablet [id number] Yes Take 1 tablet (25 mg total) by mouth daily. Reubin Milan, MD Taking Active   simvastatin (ZOCOR) 20 MG tablet [id number] No Take 1 tablet (20 mg total) by mouth daily.  Patient not taking: Reported on 07/03/2023   Reubin Milan, MD Not Taking Active             Home Care and Equipment/Supplies: Were Home Health Services Ordered?: No Any new equipment or medical supplies ordered?: No  Functional Questionnaire: Do you need assistance with bathing/showering or dressing?: No Do you need assistance with meal preparation?: No Do you need assistance with eating?: No Do you have difficulty  maintaining continence: No Do you need assistance with getting out of bed/getting out of a chair/moving?: No Do you have difficulty managing or taking your medications?: No  Follow up appointments reviewed: PCP Follow-up appointment confirmed?: No (Patient will call to schedule with PCP today) MD Provider Line Number:[phone] Given: No Specialist  Hospital Follow-up appointment confirmed?: NA Do you need transportation to your follow-up appointment?: No Do you understand care options if your condition(s) worsen?: Yes-patient verbalized understanding  SDOH Interventions Today    Flowsheet Row Most Recent Value  SDOH Interventions   Food Insecurity Interventions Intervention Not Indicated  Housing Interventions Intervention Not Indicated  Transportation Interventions Intervention Not Indicated  Utilities Interventions Intervention Not Indicated       Arna Better RN, BSN [place]  [facility] RN Care Manager [phone]

## 2023-07-14 ENCOUNTER — Other Ambulatory Visit: Payer: Self-pay | Admitting: Otolaryngology

## 2023-07-14 DIAGNOSIS — R1310 Dysphagia, unspecified: Secondary | ICD-10-CM

## 2023-07-17 ENCOUNTER — Ambulatory Visit
Admission: RE | Admit: 2023-07-17 | Discharge: 2023-07-17 | Disposition: A | Discharge status: Home / Self Care | Source: Ambulatory Visit | Attending: Otolaryngology | Admitting: Otolaryngology

## 2023-07-17 DIAGNOSIS — R1310 Dysphagia, unspecified: Secondary | ICD-10-CM | POA: Diagnosis present

## 2023-07-21 ENCOUNTER — Ambulatory Visit
Admission: RE | Admit: 2023-07-21 | Discharge: 2023-07-21 | Disposition: A | Discharge status: Home / Self Care | Source: Ambulatory Visit | Attending: Internal Medicine | Admitting: Internal Medicine

## 2023-07-21 DIAGNOSIS — Z1231 Encounter for screening mammogram for malignant neoplasm of breast: Secondary | ICD-10-CM | POA: Insufficient documentation

## 2023-08-12 ENCOUNTER — Telehealth: Payer: Self-pay

## 2023-08-12 ENCOUNTER — Other Ambulatory Visit: Payer: Self-pay

## 2023-08-12 DIAGNOSIS — Z860109 Personal history of other colon polyps: Secondary | ICD-10-CM

## 2023-08-12 DIAGNOSIS — Z800 Family history of malignant neoplasm of digestive organs: Secondary | ICD-10-CM

## 2023-08-12 MED ORDER — NA SULFATE-K SULFATE-MG SULF 17.5-3.13-1.6 GM/177ML PO SOLN: 1.0000 | Freq: Once | ORAL | 0 refills | Status: AC

## 2023-08-12 NOTE — Telephone Encounter (Signed)
 Gastroenterology Pre-Procedure Review  Request Date: 10/02/23 Requesting Physician: Dr. Ole Berkeley  PATIENT REVIEW QUESTIONS: The patient responded to the following health history questions as indicated:    1. Are you having any GI issues? no 2. Do you have a personal history of Polyps? yes (last colonoscopy performed by Dr. Ole Berkeley 09/28/2018 recommended repeat in 5 years) 3. Do you have a family history of Colon Cancer or Polyps? yes (father had colon cancer) 4. Diabetes Mellitus? no 5. Joint replacements in the past 12 months?no 6. Major health problems in the past 3 months?no 7. Any artificial heart valves, MVP, or defibrillator?no    MEDICATIONS & ALLERGIES:    Patient reports the following regarding taking any anticoagulation/antiplatelet therapy:   Plavix, Coumadin, Eliquis, Xarelto, Lovenox, Pradaxa, Brilinta, or Effient? no Aspirin? yes (81 mg daily)  Patient confirms/reports the following medications:  Current Outpatient Medications  Medication Sig Dispense Refill   albuterol  (VENTOLIN  HFA) 108 (90 Base) MCG/ACT inhaler Inhale into the lungs every 6 (six) hours as needed for wheezing or shortness of breath.     aspirin EC 81 MG tablet Take 81 mg by mouth daily. Swallow whole.     atorvastatin (LIPITOR) 40 MG tablet Take 40 mg by mouth daily.     Calcium-Magnesium-Vitamin D (CALCIUM 1200+D3 PO) Take by mouth daily.     clonazePAM  (KLONOPIN ) 0.5 MG tablet TAKE 1 TABLET BY MOUTH TWICE A DAY AS NEEDED FOR ANXIETY 20 tablet 1   diltiazem (CARDIZEM CD) 180 MG 24 hr capsule Take 180 mg by mouth daily.     FLUoxetine  (PROZAC ) 20 MG capsule TAKE 1 CAPSULE BY MOUTH EVERY DAY 90 capsule 3   losartan  (COZAAR ) 25 MG tablet Take 1 tablet (25 mg total) by mouth daily. 90 tablet 3   No current facility-administered medications for this visit.    Patient confirms/reports the following allergies:  No Known Allergies  No orders of the defined types were placed in this  encounter.   AUTHORIZATION INFORMATION Primary Insurance: 1D#: Group #:  Secondary Insurance: 1D#: Group #:  SCHEDULE INFORMATION: Date: 10/02/23 Time: Location: [facility]

## 2023-08-12 NOTE — Telephone Encounter (Signed)
 Attempted to contact patient by phone to schedule her repeat colonoscopy with Dr. Ole Berkeley in July however voice mail never gave option to lave voice message.  Mychart message was sent to her to call office to schedule.  Thanks, [place], CMA

## 2023-08-25 ENCOUNTER — Ambulatory Visit: Admitting: Dermatology

## 2023-08-25 ENCOUNTER — Encounter: Payer: Self-pay | Admitting: Dermatology

## 2023-08-25 DIAGNOSIS — W908XXA Exposure to other nonionizing radiation, initial encounter: Secondary | ICD-10-CM

## 2023-08-25 DIAGNOSIS — R21 Rash and other nonspecific skin eruption: Secondary | ICD-10-CM

## 2023-08-25 DIAGNOSIS — L986 Other infiltrative disorders of the skin and subcutaneous tissue: Secondary | ICD-10-CM | POA: Diagnosis not present

## 2023-08-25 DIAGNOSIS — L578 Other skin changes due to chronic exposure to nonionizing radiation: Secondary | ICD-10-CM | POA: Diagnosis not present

## 2023-08-25 DIAGNOSIS — D489 Neoplasm of uncertain behavior, unspecified: Secondary | ICD-10-CM

## 2023-08-25 DIAGNOSIS — L905 Scar conditions and fibrosis of skin: Secondary | ICD-10-CM

## 2023-08-25 DIAGNOSIS — D492 Neoplasm of unspecified behavior of bone, soft tissue, and skin: Secondary | ICD-10-CM

## 2023-08-25 DIAGNOSIS — Z7189 Other specified counseling: Secondary | ICD-10-CM

## 2023-08-25 NOTE — Patient Instructions (Addendum)
Biopsy Wound Care Instructions  Leave the original bandage on for 24 hours if possible.  If the bandage becomes soaked or soiled before that time, it is OK to remove it and examine the wound.  A small amount of post-operative bleeding is normal.  If excessive bleeding occurs, remove the bandage, place gauze over the site and apply continuous pressure (no peeking) over the area for 30 minutes. If this does not work, please call our clinic as soon as possible or page your doctor if it is after hours.   Once a day, cleanse the wound with soap and water. It is fine to shower. If a thick crust develops you may use a Q-tip dipped into dilute hydrogen peroxide (mix 1:1 with water) to dissolve it.  Hydrogen peroxide can slow the healing process, so use it only as needed.    After washing, apply petroleum jelly (Vaseline) or an antibiotic ointment if your doctor prescribed one for you, followed by a bandage.    For best healing, the wound should be covered with a layer of ointment at all times. If you are not able to keep the area covered with a bandage to hold the ointment in place, this may mean re-applying the ointment several times a day.  Continue this wound care until the wound has healed and is no longer open.   Itching and mild discomfort is normal during the healing process. However, if you develop pain or severe itching, please call our office.   If you have any discomfort, you can take Tylenol (acetaminophen) or ibuprofen as directed on the bottle. (Please do not take these if you have an allergy to them or cannot take them for another reason).  Some redness, tenderness and white or yellow material in the wound is normal healing.  If the area becomes very sore and red, or develops a thick yellow-green material (pus), it may be infected; please notify us.    If you have stitches, return to clinic as directed to have the stitches removed. You will continue wound care for 2-3 days after the stitches  are removed.   Wound healing continues for up to one year following surgery. It is not unusual to experience pain in the scar from time to time during the interval.  If the pain becomes severe or the scar thickens, you should notify the office.    A slight amount of redness in a scar is expected for the first six months.  After six months, the redness will fade and the scar will soften and fade.  The color difference becomes less noticeable with time.  If there are any problems, return for a post-op surgery check at your earliest convenience.  To improve the appearance of the scar, you can use silicone scar gel, cream, or sheets (such as Mederma or Serica) every night for up to one year. These are available over the counter (without a prescription).  Please call our office at [phone] for any questions or concerns.      Due to recent changes in healthcare laws, you may see results of your pathology and/or laboratory studies on MyChart before the doctors have had a chance to review them. We understand that in some cases there may be results that are confusing or concerning to you. Please understand that not all results are received at the same time and often the doctors may need to interpret multiple results in order to provide you with the best plan of care or  course of treatment. Therefore, we ask that you please give [place] 2 business days to thoroughly review all your results before contacting the office for clarification. Should we see a critical lab result, you will be contacted sooner.   If You Need Anything After Your Visit  If you have any questions or concerns for your doctor, please call our main line at [phone] and press option 4 to reach your doctor's medical assistant. If no one answers, please leave a voicemail as directed and we will return your call as soon as possible. Messages left after 4 pm will be answered the following business day.   You may also send [place] a message  via MyChart. We typically respond to MyChart messages within 1-2 business days.  For prescription refills, please ask your pharmacy to contact our office. Our fax number is [phone].  If you have an urgent issue when the clinic is closed that cannot wait until the next business day, you can page your doctor at the number below.    Please note that while we do our best to be available for urgent issues outside of office hours, we are not available 24/7.   If you have an urgent issue and are unable to reach [place], you may choose to seek medical care at your doctor's office, retail clinic, urgent care center, or emergency room.  If you have a medical emergency, please immediately call 911 or go to the emergency department.  Pager Numbers  - Dr. Gwen Pounds: [phone]  - Dr. Roseanne Reno: [phone]  - Dr. Katrinka Blazing: [phone]   In the event of inclement weather, please call our main line at [phone] for an update on the status of any delays or closures.  Dermatology Medication Tips: Please keep the boxes that topical medications come in in order to help keep track of the instructions about where and how to use these. Pharmacies typically print the medication instructions only on the boxes and not directly on the medication tubes.   If your medication is too expensive, please contact our office at [phone] option 4 or send [place] a message through MyChart.   We are unable to tell what your co-pay for medications will be in advance as this is different depending on your insurance coverage. However, we may be able to find a substitute medication at lower cost or fill out paperwork to get insurance to cover a needed medication.   If a prior authorization is required to get your medication covered by your insurance company, please allow [place] 1-2 business days to complete this process.  Drug prices often vary depending on where the prescription is filled and some pharmacies may offer cheaper  prices.  The website www.goodrx.com contains coupons for medications through different pharmacies. The prices here do not account for what the cost may be with help from insurance (it may be cheaper with your insurance), but the website can give you the price if you did not use any insurance.  - You can print the associated coupon and take it with your prescription to the pharmacy.  - You may also stop by our office during regular business hours and pick up a GoodRx coupon card.  - If you need your prescription sent electronically to a different pharmacy, notify our office through [facility] or by phone at [phone] option 4.     Si Usted Necesita Algo Despus de Su Visita  Tambin puede enviarnos un mensaje a travs de Clinical cytogeneticist. Por lo general respondemos  a los mensajes de MyChart en el transcurso de 1 a 2 das hbiles.  Para renovar recetas, por favor pida a su farmacia que se ponga en contacto con nuestra oficina. Annie Sable de fax es [place] [phone].  Si tiene un asunto urgente cuando la clnica est cerrada y que no puede esperar hasta el siguiente da hbil, puede llamar/localizar a su doctor(a) al nmero que aparece a continuacin.   Por favor, tenga en cuenta que aunque hacemos todo lo posible para estar disponibles para asuntos urgentes fuera del horario de [place], no estamos disponibles las 24 horas del da, los 7 [address]  [address] de la [place].   Si tiene un problema urgente y no puede comunicarse con nosotros, puede optar por buscar atencin mdica  en el consultorio de su doctor(a), en una clnica privada, en un centro de atencin urgente o en una sala de emergencias.  Si tiene Engineer, drilling, por favor llame inmediatamente al 911 o vaya a la sala de emergencias.  Nmeros de bper  - Dr. Gwen Pounds: [phone]  - Dra. Roseanne Reno: [phone]  - Dr. Katrinka Blazing: [phone]   En caso de inclemencias del tiempo, por favor llame a Lacy Duverney principal al [phone]  para una actualizacin sobre el [place] de cualquier retraso o cierre.  Consejos para la medicacin en dermatologa: Por favor, guarde las cajas en las que vienen los medicamentos de uso tpico para ayudarle a seguir las instrucciones sobre dnde y cmo usarlos. Las farmacias generalmente imprimen las instrucciones del medicamento slo en las cajas y no directamente en los tubos del [place].   Si su medicamento es muy caro, por favor, pngase en contacto con Rolm [place] llamando al [phone] y presione la opcin 4 o envenos un mensaje a travs de Clinical cytogeneticist.   No podemos decirle cul ser su copago por los medicamentos por adelantado ya que esto es diferente dependiendo de la cobertura de su seguro. Sin embargo, es posible que podamos encontrar un medicamento sustituto a Audiological scientist un formulario para que el seguro cubra el medicamento que se considera necesario.   Si se requiere una autorizacin previa para que su compaa de seguros [place] su medicamento, por favor permtanos de 1 a 2 das hbiles para completar [address]  Los precios de los medicamentos varan con frecuencia dependiendo del Environmental consultant de dnde se surte la receta y alguna farmacias pueden ofrecer precios ms baratos.  El sitio web www.goodrx.com tiene cupones para medicamentos de Health and safety inspector. Los precios aqu no tienen en cuenta lo que podra costar con la ayuda del seguro (puede ser ms barato con su seguro), pero el sitio web puede darle el precio si no utiliz Tourist information centre manager.  - Puede imprimir el cupn correspondiente y llevarlo con su receta a la farmacia.  - Tambin puede pasar por nuestra oficina durante el horario de atencin regular y Education officer, museum una tarjeta de cupones de GoodRx.  - Si necesita que su receta se enve electrnicamente a una farmacia diferente, informe a nuestra oficina a travs de MyChart de [place] o por telfono llamando al [phone] y presione la opcin 4.

## 2023-08-25 NOTE — Progress Notes (Signed)
   Follow-Up Visit   Subjective  Diana Mcdaniel is a 73 y.o. 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   The patient has spots, moles and lesions to be evaluated, some may be new or changing and the patient may have concern these could be cancer.  The following portions of the chart were reviewed this encounter and updated as appropriate: medications, allergies, medical history  Review of Systems:  No other skin or systemic complaints except as noted in HPI or Assessment and Plan.  Objective  Well appearing patient in no apparent distress; mood and affect are within normal limits.  A focused examination was performed of the following areas: Left lateral knee, lower legs   Relevant exam findings are noted in the Assessment and Plan.                     left lateral knee Erythematous macular patches    Assessment & Plan   ACTINIC DAMAGE - chronic, secondary to cumulative UV radiation exposure/sun exposure over time - diffuse scaly erythematous macules with underlying dyspigmentation - Recommend daily broad spectrum sunscreen SPF 30+ to sun-exposed areas, reapply every 2 hours as needed.  - Recommend staying in the shade or wearing long sleeves, sun glasses (UVA+UVB protection) and wide brim hats (4-inch brim around the entire circumference of the hat). - Call for new or changing lesions.  NEOPLASM OF UNCERTAIN BEHAVIOR left lateral knee Skin / nail biopsy Type of biopsy: tangential   Informed consent: discussed and consent obtained   Timeout: patient name, date of birth, surgical site, and procedure verified   Procedure prep:  Patient was prepped and draped in usual sterile fashion Prep type:  Isopropyl alcohol Anesthesia: the lesion was anesthetized in a standard fashion   Anesthetic:  1% lidocaine  w/ epinephrine  1-100,000 buffered w/ 8.4% NaHCO3 Instrument used: flexible razor blade   Hemostasis achieved with: pressure, aluminum chloride and electrodesiccation   Outcome: patient tolerated procedure well   Post-procedure details: sterile dressing applied and wound care instructions given   Dressing type: petrolatum and bandage   Specimen 1 - Surgical pathology Differential Diagnosis: Atopic dermatitis  vs contact dermatitis vs Cutaneous T-Cell Lymphoma   Check Margins: No Atopic dermatitis  vs contact dermatitis vs Cutaneous T-Cell Lymphoma vs Medication reaction  Hx consistent with eczema (but medication reaction is possibility)  Bx proven LICHENOID SPONGIOTIC DERMATITIS  Refer to previous pathology 02/04/2022 Accession: [id number] Has used topical steroid Mometasone  cream to affected areas in the past  Discussed possible patch testing pending results   Rash at b/l legs Exam: erythematous macule patches at lower legs see photos   Differential diagnosis:  Hx of Bx proven LICHENOID SPONGIOTIC DERMATITIS  Refer to previous pathology 02/04/2022 Accession: [id number]  Treatment Plan: Bx pending Atopic dermatitis  vs contact dermatitis vs Cutaneous T-Cell Lymphoma   May consider patch testing in future   Return if symptoms worsen or fail to improve.  IRandee Busing, CMA, am acting as scribe for Celine Collard, MD.  Documentation: I have reviewed the above documentation for accuracy and completeness, and I agree with the above.  Celine Collard, MD

## 2023-08-29 ENCOUNTER — Ambulatory Visit: Payer: Self-pay | Admitting: Dermatology

## 2023-09-01 NOTE — Telephone Encounter (Addendum)
 Called and discussed results with patient. She verbalized understanding. Patient scheduled for clinic follow up to have punch bx for T Cell rearrangement studies.     ---- Message from Celine Collard sent at 08/29/2023  2:52 PM EDT ----- FINAL DIAGNOSIS        1. Skin, left lateral knee :       ATYPICAL SUPERFICIAL DERMAL LYMPHOCYTIC INFILTRATE WITH DELICATE PAPILLARY       DERMAL FIBROSIS SUGGESTIVE OF EARLY MYCOSIS FUNGOIDES, SEE COMMENT.   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 Recommend additional biopsy for T cell rearrangement studies. Please schedule for punch biopsy. ----- Message ----- From: Interface, Lab In Three Zero One Sent: 08/29/2023   1:00 PM EDT To: Elta Halter, MD

## 2023-09-03 ENCOUNTER — Ambulatory Visit: Admitting: Internal Medicine

## 2023-09-03 ENCOUNTER — Encounter: Payer: Self-pay | Admitting: Internal Medicine

## 2023-09-03 VITALS — BP 102/64 | HR 59 | Ht 68.0 in | Wt 206.4 lb

## 2023-09-03 DIAGNOSIS — F410 Panic disorder [episodic paroxysmal anxiety] without agoraphobia: Secondary | ICD-10-CM | POA: Diagnosis not present

## 2023-09-03 DIAGNOSIS — E782 Mixed hyperlipidemia: Secondary | ICD-10-CM

## 2023-09-03 DIAGNOSIS — F324 Major depressive disorder, single episode, in partial remission: Secondary | ICD-10-CM

## 2023-09-03 DIAGNOSIS — I10 Essential (primary) hypertension: Secondary | ICD-10-CM | POA: Diagnosis not present

## 2023-09-03 MED ORDER — DILTIAZEM HCL ER COATED BEADS 180 MG PO CP24: 180.0000 mg | ORAL_CAPSULE | Freq: Every day | ORAL | 1 refills | Status: DC

## 2023-09-03 MED ORDER — CLONAZEPAM 0.5 MG PO TABS
ORAL_TABLET | ORAL | 1 refills | Status: DC
Start: 2023-09-03 — End: 2023-12-02

## 2023-09-03 MED ORDER — ATORVASTATIN CALCIUM 40 MG PO TABS: 40.0000 mg | ORAL_TABLET | Freq: Every day | ORAL | 1 refills | Status: DC

## 2023-09-03 NOTE — Assessment & Plan Note (Signed)
 Clinically stable on Prozac .   No SI or HI on evaluation. Plan to continue same medications for now.

## 2023-09-03 NOTE — Assessment & Plan Note (Signed)
 On Prozac  daily and PRN clonazepam .

## 2023-09-03 NOTE — Progress Notes (Signed)
 Date:  09/03/2023   Name:  Diana Mcdaniel   DOB:  Jan 25, 1951   MRN:  [id number]   Chief Complaint: Hypertension and Depression  Hypertension This is a chronic problem. The problem is controlled. Pertinent negatives include no chest pain, headaches, palpitations or shortness of breath. Past treatments include angiotensin blockers and calcium channel blockers.  Depression        This is a chronic problem.The problem is unchanged.  Associated symptoms include no fatigue, no myalgias and no headaches.  Past treatments include SSRIs - Selective serotonin reuptake inhibitors.  Compliance with treatment is good. Hyperlipidemia This is a chronic problem. Recent lipid tests were reviewed and are high. Pertinent negatives include no chest pain, myalgias or shortness of breath. Current antihyperlipidemic treatment includes statins (simvastatin  changed atorvastatin).    Review of Systems  Constitutional:  Negative for chills, fatigue and unexpected weight change.  HENT:  Negative for trouble swallowing.   Eyes:  Negative for visual disturbance.  Respiratory:  Negative for cough, chest tightness, shortness of breath and wheezing.   Cardiovascular:  Negative for chest pain, palpitations and leg swelling.  Gastrointestinal:  Negative for abdominal pain, constipation and diarrhea.  Musculoskeletal:  Negative for arthralgias and myalgias.  Neurological:  Negative for dizziness, weakness, light-headedness and headaches.  Psychiatric/Behavioral:  Positive for depression. Negative for dysphoric mood and sleep disturbance. The patient is not nervous/anxious.      Lab Results  Component Value Date   NA 135 05/22/2023   K 4.8 05/22/2023   CO2 24 05/22/2023   GLUCOSE 99 05/22/2023   BUN 9 05/22/2023   CREATININE 0.74 05/22/2023   CALCIUM 9.6 05/22/2023   EGFR 85 05/22/2023   GFRNONAA 69 05/04/2020   Lab Results  Component Value Date   CHOL 186 05/22/2023   HDL 69 05/22/2023   LDLCALC 97  05/22/2023   TRIG 111 05/22/2023   CHOLHDL 2.7 05/22/2023   Lab Results  Component Value Date   TSH 3.480 05/22/2023   Lab Results  Component Value Date   HGBA1C 5.5 05/04/2020   Lab Results  Component Value Date   WBC 5.0 05/22/2023   HGB 13.0 05/22/2023   HCT 38.5 05/22/2023   MCV 88 05/22/2023   PLT 309 05/22/2023   Lab Results  Component Value Date   ALT 18 05/22/2023   AST 24 05/22/2023   ALKPHOS 82 05/22/2023   BILITOT 0.7 05/22/2023   Lab Results  Component Value Date   VD25OH 41.8 05/04/2020     Patient Active Problem List   Diagnosis Date Noted   NSTEMI (non-ST elevated myocardial infarction) (HCC) 07/03/2023   Primary insomnia 05/03/2019   Family history of colon cancer in father    Polyp of sigmoid colon    Osteopenia determined by x-ray 05/26/2018   Sarcoidosis of lung (HCC) 04/29/2018   Migraine headache with aura 04/29/2018   Tremor, essential 04/29/2018   Panic disorder 04/29/2018   Essential hypertension 12/01/2017   Mixed hyperlipidemia 12/01/2017   Ganglion cyst of foot 12/01/2017   Major depressive disorder with single episode, in partial remission (HCC) 12/01/2017   Primary osteoarthritis of left hip 12/01/2017    No Known Allergies  Past Surgical History:  Procedure Laterality Date   CARDIAC CATHETERIZATION  2010   Normal   COLONOSCOPY WITH PROPOFOL  N/A 09/28/2018   Procedure: COLONOSCOPY WITH  biopsy;  Surgeon: Marnee Sink, MD;  Location: [facility];  Service: Endoscopy;  Laterality: N/A;   POLYPECTOMY  09/28/2018   Procedure: POLYPECTOMY;  Surgeon: Marnee Sink, MD;  Location: [facility];  Service: Endoscopy;;    Social History   Tobacco Use   Smoking status: Former    Current packs/day: 0.00    Average packs/day: 1 pack/day for 15.0 years (15.0 ttl pk-yrs)    Types: Cigarettes    Start date: 03/18/1978    Quit date: 03/18/1993    Years since quitting: 30.4   Smokeless tobacco: Never  Vaping Use   Vaping  status: Never Used  Substance Use Topics   Alcohol use: Yes    Alcohol/week: 24.0 standard drinks of alcohol    Types: 24 Glasses of wine per week    Comment: 3-4 glasses wine/day   Drug use: Never     Medication list has been reviewed and updated.  Current Meds  Medication Sig   aspirin EC 81 MG tablet Take 81 mg by mouth daily. Swallow whole.   Calcium-Magnesium-Vitamin D (CALCIUM 1200+D3 PO) Take by mouth daily.   FLUoxetine  (PROZAC ) 20 MG capsule TAKE 1 CAPSULE BY MOUTH EVERY DAY   losartan  (COZAAR ) 25 MG tablet Take 1 tablet (25 mg total) by mouth daily.   omeprazole (PRILOSEC) 20 MG capsule Take 20 mg by mouth daily.   [DISCONTINUED] atenolol  (TENORMIN ) 25 MG tablet Take 25 mg by mouth daily.   [DISCONTINUED] atorvastatin (LIPITOR) 40 MG tablet Take 40 mg by mouth daily.   [DISCONTINUED] clonazePAM  (KLONOPIN ) 0.5 MG tablet TAKE 1 TABLET BY MOUTH TWICE A DAY AS NEEDED FOR ANXIETY   [DISCONTINUED] diltiazem (CARDIZEM CD) 180 MG 24 hr capsule Take 180 mg by mouth daily.       09/03/2023   11:00 AM 07/03/2023    9:22 AM 05/22/2023   11:08 AM 03/31/2023    8:46 AM  GAD 7 : Generalized Anxiety Score  Nervous, Anxious, on Edge 1 0 0 0  Control/stop worrying 1 0 0 0  Worry too much - different things 0 0 0 0  Trouble relaxing 0 0 0 0  Restless 0 0 0 0  Easily annoyed or irritable 0 0 0 0  Afraid - awful might happen 1 0 0 0  Total GAD 7 Score 3 0 0 0  Anxiety Difficulty Not difficult at all  Not difficult at all Not difficult at all       09/03/2023   11:00 AM 07/03/2023    9:21 AM 05/22/2023   11:08 AM  Depression screen PHQ 2/9  Decreased Interest 0 0 0  Down, Depressed, Hopeless 0 0 0  PHQ - 2 Score 0 0 0  Altered sleeping 0  0  Tired, decreased energy 3  0  Change in appetite 0  0  Feeling bad or failure about yourself    0  Trouble concentrating 0  0  Moving slowly or fidgety/restless 3  0  Suicidal thoughts 0  0  PHQ-9 Score 6  0  Difficult doing work/chores Not  difficult at all  Not difficult at all    BP Readings from Last 3 Encounters:  09/03/23 102/64  07/03/23 108/64  05/22/23 118/72    Physical Exam Vitals and nursing note reviewed.  Constitutional:      General: She is not in acute distress.    Appearance: Normal appearance. She is well-developed.  HENT:     Head: Normocephalic and atraumatic.   Cardiovascular:     Rate and Rhythm: Normal rate and regular rhythm.  Pulmonary:  Effort: Pulmonary effort is normal. No respiratory distress.     Breath sounds: No wheezing or rhonchi.   Musculoskeletal:     Cervical back: Normal range of motion.     Right lower leg: No edema.     Left lower leg: No edema.   Skin:    General: Skin is warm and dry.     Capillary Refill: Capillary refill takes less than 2 seconds.     Findings: No rash.   Neurological:     General: No focal deficit present.     Mental Status: She is alert and oriented to person, place, and time.   Psychiatric:        Mood and Affect: Mood normal.        Behavior: Behavior normal.     Wt Readings from Last 3 Encounters:  09/03/23 206 lb 6.4 oz (93.6 kg)  07/03/23 206 lb 8 oz (93.7 kg)  05/22/23 211 lb (95.7 kg)    BP 102/64   Pulse (!) 59   Ht 5' 8 (1.727 m)   Wt 206 lb 6.4 oz (93.6 kg)   SpO2 96%   BMI 31.38 kg/m   Assessment and Plan:  Problem List Items Addressed This Visit       Unprioritized   Essential hypertension - Primary (Chronic)   Blood pressure is well controlled.  Current medications are losartan  and Cardiazem.  Cardiazem started after LHC suggestive of vasospasm.  She has not had any further chest pain. Will continue same regimen along with efforts to limit dietary sodium.       Relevant Medications   atorvastatin (LIPITOR) 40 MG tablet   diltiazem (CARDIZEM CD) 180 MG 24 hr capsule   Mixed hyperlipidemia (Chronic)   Now on Atorvastatin and off simvastatin  after LHC Will get labs next visit in September       Relevant Medications   atorvastatin (LIPITOR) 40 MG tablet   diltiazem (CARDIZEM CD) 180 MG 24 hr capsule   Major depressive disorder with single episode, in partial remission (HCC) (Chronic)   Clinically stable on Prozac .   No SI or HI on evaluation. Plan to continue same medications for now.       Panic disorder   On Prozac  daily and PRN clonazepam .      Relevant Medications   clonazePAM  (KLONOPIN ) 0.5 MG tablet    No follow-ups on file.    Sheron Dixons, MD [facility] [place]

## 2023-09-03 NOTE — Assessment & Plan Note (Signed)
 Now on Atorvastatin and off simvastatin  after LHC Will get labs next visit in September

## 2023-09-03 NOTE — Assessment & Plan Note (Signed)
 Blood pressure is well controlled.  Current medications are losartan  and Cardiazem.  Cardiazem started after LHC suggestive of vasospasm.  She has not had any further chest pain. Will continue same regimen along with efforts to limit dietary sodium.

## 2023-09-17 ENCOUNTER — Ambulatory Visit: Admitting: Dermatology

## 2023-09-17 ENCOUNTER — Encounter: Payer: Self-pay | Admitting: Dermatology

## 2023-09-17 DIAGNOSIS — C84A0 Cutaneous T-cell lymphoma, unspecified, unspecified site: Secondary | ICD-10-CM

## 2023-09-17 DIAGNOSIS — Z7189 Other specified counseling: Secondary | ICD-10-CM

## 2023-09-17 DIAGNOSIS — Z79899 Other long term (current) drug therapy: Secondary | ICD-10-CM

## 2023-09-17 MED ORDER — TRIAMCINOLONE ACETONIDE 0.1 % EX CREA: 1.0000 | TOPICAL_CREAM | CUTANEOUS | 1 refills | Status: DC

## 2023-09-17 NOTE — Progress Notes (Signed)
   Follow-Up Visit   Subjective  Diana Mcdaniel is a 73 y.o. female who presents for the following: Bx proven  ATYPICAL SUPERFICIAL DERMAL LYMPHOCYTIC INFILTRATE WITH DELICATE PAPILLARY       DERMAL FIBROSIS SUGGESTIVE OF EARLY MYCOSIS FUNGOIDES, pt presents for additional biopsy  The following portions of the chart were reviewed this encounter and updated as appropriate: medications, allergies, medical history  Review of Systems:  No other skin or systemic complaints except as noted in HPI or Assessment and Plan.  Objective  Well appearing patient in no apparent distress; mood and affect are within normal limits.  A focused examination was performed of the following areas: legs Relevant exam findings are noted in the Assessment and Plan.   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 Exam: erythematous macules and patches lower legs  Treatment Plan: Diana Mcdaniel discussed with Diana Mcdaniel at [facility], that previous specimen from 08/25/23 can be used for the T-Cell gene rearrangement study.  Advised pt we will call when we get results and discuss results/treatment. Start TMC 0.1% cr qd/bid 5d/wk aa rash on legs, avoid f/g/a  Topical steroids (such as triamcinolone , fluocinolone, fluocinonide, mometasone , clobetasol, halobetasol, betamethasone, hydrocortisone) can cause thinning and lightening of the skin if they are used for too long in the same area. Your physician has selected the right strength medicine for your problem and area affected on the body. Please use your medication only as directed by your physician to prevent side effects.      Return for prn pending T-Cell gene rearrangement study results.  I, Diana Mcdaniel, RMA, am acting as scribe for Diana Hester, MD .   Documentation: I have reviewed the above documentation for accuracy and completeness, and  I agree with the above.  Diana Hester, MD

## 2023-09-17 NOTE — Patient Instructions (Addendum)

## 2023-10-01 ENCOUNTER — Encounter: Payer: Self-pay | Admitting: Gastroenterology

## 2023-10-02 ENCOUNTER — Ambulatory Visit: Admitting: Certified Registered"

## 2023-10-02 ENCOUNTER — Encounter
Admission: RE | Disposition: A | Discharge status: Home / Self Care | Payer: Self-pay | Source: Home / Self Care | Attending: Gastroenterology

## 2023-10-02 ENCOUNTER — Ambulatory Visit
Admission: RE | Admit: 2023-10-02 | Discharge: 2023-10-02 | Disposition: A | Discharge status: Home / Self Care | Attending: Gastroenterology | Admitting: Gastroenterology

## 2023-10-02 ENCOUNTER — Other Ambulatory Visit: Payer: Self-pay

## 2023-10-02 ENCOUNTER — Encounter: Payer: Self-pay | Admitting: Gastroenterology

## 2023-10-02 DIAGNOSIS — K5730 Diverticulosis of large intestine without perforation or abscess without bleeding: Secondary | ICD-10-CM | POA: Insufficient documentation

## 2023-10-02 DIAGNOSIS — I10 Essential (primary) hypertension: Secondary | ICD-10-CM | POA: Diagnosis not present

## 2023-10-02 DIAGNOSIS — D125 Benign neoplasm of sigmoid colon: Secondary | ICD-10-CM | POA: Diagnosis not present

## 2023-10-02 DIAGNOSIS — D869 Sarcoidosis, unspecified: Secondary | ICD-10-CM | POA: Diagnosis not present

## 2023-10-02 DIAGNOSIS — Z800 Family history of malignant neoplasm of digestive organs: Secondary | ICD-10-CM | POA: Insufficient documentation

## 2023-10-02 DIAGNOSIS — I252 Old myocardial infarction: Secondary | ICD-10-CM | POA: Diagnosis not present

## 2023-10-02 DIAGNOSIS — Z860109 Personal history of other colon polyps: Secondary | ICD-10-CM

## 2023-10-02 DIAGNOSIS — Z87891 Personal history of nicotine dependence: Secondary | ICD-10-CM | POA: Diagnosis not present

## 2023-10-02 DIAGNOSIS — Z1211 Encounter for screening for malignant neoplasm of colon: Secondary | ICD-10-CM | POA: Insufficient documentation

## 2023-10-02 DIAGNOSIS — K641 Second degree hemorrhoids: Secondary | ICD-10-CM | POA: Diagnosis not present

## 2023-10-02 HISTORY — PX: POLYPECTOMY: SHX149

## 2023-10-02 HISTORY — PX: COLONOSCOPY: SHX5424

## 2023-10-02 SURGERY — COLONOSCOPY
Anesthesia: General

## 2023-10-02 MED ORDER — PROPOFOL 500 MG/50ML IV EMUL
INTRAVENOUS | Status: DC | PRN
  Administered 2023-10-02: 125 ug/kg/min via INTRAVENOUS

## 2023-10-02 MED ORDER — GLYCOPYRROLATE 0.2 MG/ML IJ SOLN
INTRAMUSCULAR | Status: AC
  Filled 2023-10-02: qty 1

## 2023-10-02 MED ORDER — ONDANSETRON HCL 4 MG/2ML IJ SOLN
INTRAMUSCULAR | Status: AC
  Filled 2023-10-02: qty 2

## 2023-10-02 MED ORDER — ONDANSETRON HCL 4 MG/2ML IJ SOLN
INTRAMUSCULAR | Status: DC | PRN
  Administered 2023-10-02: 4 mg via INTRAVENOUS

## 2023-10-02 MED ORDER — PROPOFOL 10 MG/ML IV BOLUS
INTRAVENOUS | Status: DC | PRN
  Administered 2023-10-02: 50 mg via INTRAVENOUS

## 2023-10-02 MED ORDER — LIDOCAINE HCL (CARDIAC) PF 100 MG/5ML IV SOSY
PREFILLED_SYRINGE | INTRAVENOUS | Status: DC | PRN
  Administered 2023-10-02: 50 mg via INTRAVENOUS

## 2023-10-02 MED ORDER — SODIUM CHLORIDE 0.9 % IV SOLN: INTRAVENOUS | Status: DC

## 2023-10-02 NOTE — Transfer of Care (Signed)
 Immediate Anesthesia Transfer of Care Note  Patient: Diana Mcdaniel  Procedure(s) Performed: COLONOSCOPY POLYPECTOMY, INTESTINE  Patient Location: PACU and Endoscopy Unit  Anesthesia Type:General  Level of Consciousness: drowsy and patient cooperative  Airway & Oxygen Therapy: Patient Spontanous Breathing  Post-op Assessment: Report given to RN and Post -op Vital signs reviewed and stable  Post vital signs: Reviewed and stable  Last Vitals:  Vitals Value Taken Time  BP 121/46   Temp    Pulse 80 10/02/23 09:43  Resp 18 10/02/23 09:43  SpO2 98 % 10/02/23 09:43  Vitals shown include unfiled device data.  Last Pain:  Vitals:   10/02/23 0828  TempSrc: Temporal  PainSc: 0-No pain         Complications: No notable events documented.

## 2023-10-02 NOTE — Anesthesia Preprocedure Evaluation (Addendum)
 Anesthesia Evaluation  Patient identified by MRN, date of birth, ID band Patient awake    Reviewed: Allergy & Precautions, H&P , NPO status , Patient's Chart, lab work & pertinent test results  Airway Mallampati: II  TM Distance: >3 FB Neck ROM: full    Dental no notable dental hx. (+) Chipped   Pulmonary former smoker sarcoidosis   Pulmonary exam normal breath sounds clear to auscultation       Cardiovascular hypertension, + Past MI  Normal cardiovascular exam Rhythm:regular Rate:Normal     Neuro/Psych  PSYCHIATRIC DISORDERS Anxiety Depression    negative neurological ROS     GI/Hepatic negative GI ROS, Neg liver ROS,,,  Endo/Other  negative endocrine ROS    Renal/GU negative Renal ROS  negative genitourinary   Musculoskeletal   Abdominal   Peds  Hematology negative hematology ROS (+)   Anesthesia Other Findings Past Medical History: No date: Anxiety 2005: Arthritis 11/02/2019: Elevated TSH     Comment:  Normal T4 04/2019 No date: Hyperlipidemia No date: Hypertension No date: Major depressive disorder with single episode, in partial  remission (HCC) No date: Migraine headache     Comment:  1-2x/year 07/03/2023: NSTEMI (non-ST elevated myocardial infarction) (HCC) No date: Sarcoidosis  Past Surgical History: 2010: CARDIAC CATHETERIZATION     Comment:  Normal 09/28/2018: COLONOSCOPY WITH PROPOFOL ; N/A     Comment:  Procedure: COLONOSCOPY WITH  biopsy;  Surgeon: Jinny Carmine, MD;  Location: [facility];  Service:               Endoscopy;  Laterality: N/A; 09/28/2018: POLYPECTOMY     Comment:  Procedure: POLYPECTOMY;  Surgeon: Jinny Carmine, MD;                Location: [facility];  Service: Endoscopy;;  BMI    Body Mass Index: 30.59 kg/m      Reproductive/Obstetrics negative OB ROS                              Anesthesia Physical Anesthesia  Plan  ASA: 3  Anesthesia Plan: General   Post-op Pain Management: Minimal or no pain anticipated   Induction: Intravenous  PONV Risk Score and Plan: 3 and Propofol  infusion, TIVA and Ondansetron   Airway Management Planned: Nasal Cannula  Additional Equipment: None  Intra-op Plan:   Post-operative Plan:   Informed Consent: I have reviewed the patients History and Physical, chart, labs and discussed the procedure including the risks, benefits and alternatives for the proposed anesthesia with the patient or authorized representative who has indicated his/her understanding and acceptance.     Dental advisory given  Plan Discussed with: CRNA and Surgeon  Anesthesia Plan Comments: (Discussed risks of anesthesia with patient, including possibility of difficulty with spontaneous ventilation under anesthesia necessitating airway intervention, PONV, and rare risks such as cardiac or respiratory or neurological events, and allergic reactions. Discussed the role of CRNA in patient's perioperative care. Patient understands.)         Anesthesia Quick Evaluation

## 2023-10-02 NOTE — H&P (Signed)
 Diana Copping, MD [facility] [address], [address] [place], [place] [address] [phone] Fax : [phone]  Primary Care Physician:  Justus [name], MD Primary Gastroenterologist:  Dr. Copping  Pre-Procedure History & Physical: HPI:  Diana Mcdaniel is a 73 y.o. female is here for an colonoscopy.   Past Medical History:  Diagnosis Date   Anxiety    Arthritis 2005   Elevated TSH 11/02/2019   Normal T4 04/2019   Hyperlipidemia    Hypertension    Major depressive disorder with single episode, in partial remission [facility])    Migraine headache    1-2x/year   NSTEMI (non-ST elevated myocardial infarction) (HCC) 07/03/2023   Sarcoidosis     Past Surgical History:  Procedure Laterality Date   CARDIAC CATHETERIZATION  2010   Normal   COLONOSCOPY WITH PROPOFOL  N/A 09/28/2018   Procedure: COLONOSCOPY WITH  biopsy;  Surgeon: Mcdaniel Rogelia, MD;  Location: [facility];  Service: Endoscopy;  Laterality: N/A;   POLYPECTOMY  09/28/2018   Procedure: POLYPECTOMY;  Surgeon: Mcdaniel Rogelia, MD;  Location: [facility];  Service: Endoscopy;;    Prior to Admission medications   Medication Sig Start Date End Date Taking? Authorizing Provider  aspirin EC 81 MG tablet Take 81 mg by mouth daily. Swallow whole.   Yes [provider]  atorvastatin  (LIPITOR) 40 MG tablet Take 1 tablet (40 mg total) by mouth daily. 09/03/23  Yes Justus [name], MD  Calcium -Magnesium-Vitamin D (CALCIUM  1200+D3 PO) Take by mouth daily.   Yes [provider]  clonazePAM  (KLONOPIN ) 0.5 MG tablet TAKE 1 TABLET BY MOUTH TWICE A DAY AS NEEDED FOR ANXIETY 09/03/23  Yes Justus [name], MD  diltiazem  (CARDIZEM  CD) 180 MG 24 hr capsule Take 1 capsule (180 mg total) by mouth daily. 09/03/23  Yes Justus [name], MD  FLUoxetine  (PROZAC ) 20 MG capsule TAKE 1 CAPSULE BY MOUTH EVERY DAY 05/22/23  Yes Justus [name], MD  losartan  (COZAAR ) 25 MG tablet Take 1 tablet (25 mg total) by mouth daily.  05/22/23  Yes Justus [name], MD  omeprazole (PRILOSEC) 20 MG capsule Take 20 mg by mouth daily. 07/11/23  Yes [provider]  triamcinolone  cream (KENALOG ) 0.1 % Apply 1 Application topically as directed. qd to bid aa rash on legs 5 days a week, avoid face, groin, axilla 09/17/23  Yes Hester Alm BROCKS, MD    Allergies as of 08/12/2023   (No Known Allergies)    Family History  Problem Relation Age of Onset   Colon cancer Father    Skin cancer Sister    Breast cancer Sister 42   Skin cancer Brother    Breast cancer Maternal Grandmother 78   Diabetes Neg Hx    Heart disease Neg Hx    Hypertension Neg Hx     Social History   Socioeconomic History   Marital status: Married    Spouse name: Not on file   Number of children: 1   Years of education: Not on file   Highest education level: Master's degree (e.g., MA, MS, MEng, MEd, MSW, MBA)  Occupational History   Not on file  Tobacco Use   Smoking status: Former    Current packs/day: 0.00    Average packs/day: 1 pack/day for 15.0 years (15.0 ttl pk-yrs)    Types: Cigarettes    Start date: 03/18/1978    Quit date: 03/18/1993    Years since quitting: 30.5   Smokeless tobacco: Never  Vaping  Use   Vaping status: Never Used  Substance and Sexual Activity   Alcohol use: Yes    Alcohol/week: 24.0 standard drinks of alcohol    Types: 24 Glasses of wine per week    Comment: 3-4 glasses wine/day   Drug use: Never   Sexual activity: Yes    Birth control/protection: Post-menopausal  Other Topics Concern   Not on file  Social History Narrative   Not on file   Social Drivers of Health   Financial Resource Strain: Low Risk  (09/01/2023)   Overall Financial Resource Strain (CARDIA)    Difficulty of Paying Living Expenses: Not hard at all  Food Insecurity: No Food Insecurity (09/01/2023)   Hunger Vital Sign    Worried About Running Out of Food in the Last Year: Never true    Ran Out of Food in the Last Year: Never true   Transportation Needs: No Transportation Needs (09/01/2023)   PRAPARE - Administrator, Civil Service (Medical): No    Lack of Transportation (Non-Medical): No  Physical Activity: Inactive (09/01/2023)   Exercise Vital Sign    Days of Exercise per Week: 0 days    Minutes of Exercise per Session: Not on file  Stress: No Stress Concern Present (09/01/2023)   [employer] of Occupational Health - Occupational Stress Questionnaire    Feeling of Stress: Only a little  Social Connections: Moderately Isolated (09/01/2023)   Social Connection and Isolation Panel    Frequency of Communication with Friends and Family: Twice a week    Frequency of Social Gatherings with Friends and Family: Once a week    Attends Religious Services: Never    Database administrator or Organizations: No    Attends Engineer, structural: Not on file    Marital Status: Married  Catering manager Violence: Not At Risk (07/03/2023)   Humiliation, Afraid, Rape, and Kick questionnaire    Fear of Current or Ex-Partner: No    Emotionally Abused: No    Physically Abused: No    Sexually Abused: No    Review of Systems: See HPI, otherwise negative ROS  Physical Exam: BP (!) 140/67   Pulse 69   Temp (!) 96.8 F (36 C) (Temporal)   Resp 17   Ht 5' 8 (1.727 m)   Wt 91.3 kg   SpO2 99%   BMI 30.59 kg/m  General:   Alert,  pleasant and cooperative in NAD Head:  Normocephalic and atraumatic. Neck:  Supple; no masses or thyromegaly. Lungs:  Clear throughout to auscultation.    Heart:  Regular rate and rhythm. Abdomen:  Soft, nontender and nondistended. Normal bowel sounds, without guarding, and without rebound.   Neurologic:  Alert and  oriented x4;  grossly normal neurologically.  Impression/Plan: Diana Mcdaniel is here for an colonoscopy to be performed for family history of colon cancer  Risks, benefits, limitations, and alternatives regarding  colonoscopy have been reviewed with the  patient.  Questions have been answered.  All parties agreeable.   Diana Copping, MD  10/02/2023, 9:08 AM

## 2023-10-02 NOTE — Anesthesia Postprocedure Evaluation (Signed)
 Anesthesia Post Note  Patient: Diana Mcdaniel  Procedure(s) Performed: COLONOSCOPY POLYPECTOMY, INTESTINE  Patient location during evaluation: Endoscopy Anesthesia Type: General Level of consciousness: awake and alert Pain management: pain level controlled Vital Signs Assessment: post-procedure vital signs reviewed and stable Respiratory status: spontaneous breathing, nonlabored ventilation, respiratory function stable and patient connected to nasal cannula oxygen Cardiovascular status: blood pressure returned to baseline and stable Postop Assessment: no apparent nausea or vomiting Anesthetic complications: no   No notable events documented.   Last Vitals:  Vitals:   10/02/23 0951 10/02/23 1001  BP: 105/61 (!) 117/43  Pulse: 72 64  Resp: (!) 23 19  Temp: (!) 36.2 C (!) 36.2 C  SpO2: 99% 100%    Last Pain:  Vitals:   10/02/23 1001  TempSrc: Temporal  PainSc: 0-No pain                 Debby Mines

## 2023-10-02 NOTE — Op Note (Signed)
 [facility]ology Patient Name: Diana Mcdaniel Procedure Date: 10/02/2023 9:14 AM MRN: [id number] Account #: [id number] Date of Birth: [birth year] Admit Type: Outpatient Age: 73 Room: [facility] ENDO ROOM 4 Gender: Female Note Status: Finalized Instrument Name: Arvis [id number] Procedure:             Colonoscopy Indications:           Family history of colon cancer in a first-degree                         relative Providers:             Rogelia Copping MD, MD Referring MD:          Leita Adie, MD (Referring MD) Medicines:             Propofol  per Anesthesia Complications:         No immediate complications. Procedure:             Pre-Anesthesia Assessment:                        - Prior to the procedure, a History and Physical was                         performed, and patient medications and allergies were                         reviewed. The patient's tolerance of previous                         anesthesia was also reviewed. The risks and benefits                         of the procedure and the sedation options and risks                         were discussed with the patient. All questions were                         answered, and informed consent was obtained. Prior                         Anticoagulants: The patient has taken no anticoagulant                         or antiplatelet agents. ASA Grade Assessment: II - A                         patient with mild systemic disease. After reviewing                         the risks and benefits, the patient was deemed in                         satisfactory condition to undergo the procedure.                        After obtaining informed consent, the colonoscope was  passed under direct vision. Throughout the procedure,                         the patient's blood pressure, pulse, and oxygen                         saturations were monitored continuously. The                          Colonoscope was introduced through the anus and                         advanced to the the cecum, identified by appendiceal                         orifice and ileocecal valve. The colonoscopy was                         performed without difficulty. The patient tolerated                         the procedure well. The quality of the bowel                         preparation was excellent. Findings:      The perianal and digital rectal examinations were normal.      A 4 mm polyp was found in the sigmoid colon. The polyp was sessile. The       polyp was removed with a cold snare. Resection and retrieval were       complete.      Multiple small-mouthed diverticula were found in the entire colon.      Non-bleeding internal hemorrhoids were found during retroflexion. The       hemorrhoids were Grade II (internal hemorrhoids that prolapse but reduce       spontaneously). Impression:            - One 4 mm polyp in the sigmoid colon, removed with a                         cold snare. Resected and retrieved.                        - Diverticulosis in the entire examined colon.                        - Non-bleeding internal hemorrhoids. Recommendation:        - Discharge patient to home.                        - Resume previous diet.                        - Continue present medications.                        - Await pathology results.                        - Repeat colonoscopy in 5 years for surveillance. Procedure Code(s):     ---  Professional ---                        [phone], Colonoscopy, flexible; with removal of                         tumor(s), polyp(s), or other lesion(s) by snare                         technique Diagnosis Code(s):     --- Professional ---                        Z80.0, Family history of malignant neoplasm of                         digestive organs CPT copyright 2022 American Medical Association. All rights reserved. The codes documented in this report are  preliminary and upon coder review may  be revised to meet current compliance requirements. Rogelia Copping MD, MD 10/02/2023 9:38:20 AM This report has been signed electronically. Number of Addenda: 0 Note Initiated On: 10/02/2023 9:14 AM Scope Withdrawal Time: 0 hours 5 minutes 58 seconds  Total Procedure Duration: 0 hours 13 minutes 37 seconds  Estimated Blood Loss:  Estimated blood loss: none.      [facility]

## 2023-10-02 NOTE — Anesthesia Procedure Notes (Signed)
 Procedure Name: MAC Date/Time: 10/02/2023 9:20 AM  Performed by: Lacretia Camelia NOVAK, CRNAPre-anesthesia Checklist: Patient identified, Emergency Drugs available, Suction available and Patient being monitored Patient Re-evaluated:Patient Re-evaluated prior to induction Oxygen Delivery Method: Nasal cannula

## 2023-10-03 ENCOUNTER — Encounter: Payer: Self-pay | Admitting: Gastroenterology

## 2023-10-03 LAB — SURGICAL PATHOLOGY

## 2023-10-06 ENCOUNTER — Ambulatory Visit: Payer: Self-pay | Admitting: Gastroenterology

## 2023-10-23 ENCOUNTER — Telehealth: Payer: Self-pay

## 2023-10-23 NOTE — Telephone Encounter (Signed)
 Patient left a voicemail that she hasn't heard back about additional testing from the biopsy performed on 08/25/23. I contacted Porsha at [facility] and left a voicemail for her to return my call.

## 2023-10-27 NOTE — Telephone Encounter (Signed)
 Left voicemail to return my call

## 2023-10-27 NOTE — Telephone Encounter (Signed)
 Results have been faxed in and placed in media. aw

## 2023-10-28 NOTE — Telephone Encounter (Signed)
 Patient advised of information per Dr. Hester. aw

## 2023-11-24 ENCOUNTER — Other Ambulatory Visit: Payer: Self-pay | Admitting: Dermatology

## 2023-11-24 ENCOUNTER — Encounter: Payer: Self-pay | Admitting: Internal Medicine

## 2023-11-24 ENCOUNTER — Ambulatory Visit: Admitting: Internal Medicine

## 2023-11-24 VITALS — BP 108/66 | HR 73 | Ht 68.0 in | Wt 200.0 lb

## 2023-11-24 DIAGNOSIS — G250 Essential tremor: Secondary | ICD-10-CM

## 2023-11-24 DIAGNOSIS — Z23 Encounter for immunization: Secondary | ICD-10-CM | POA: Diagnosis not present

## 2023-11-24 DIAGNOSIS — E782 Mixed hyperlipidemia: Secondary | ICD-10-CM | POA: Diagnosis not present

## 2023-11-24 DIAGNOSIS — I252 Old myocardial infarction: Secondary | ICD-10-CM | POA: Diagnosis not present

## 2023-11-24 DIAGNOSIS — G43109 Migraine with aura, not intractable, without status migrainosus: Secondary | ICD-10-CM | POA: Diagnosis not present

## 2023-11-24 DIAGNOSIS — I10 Essential (primary) hypertension: Secondary | ICD-10-CM

## 2023-11-24 MED ORDER — SPIKEVAX COVID-19 VACCINE 100 MCG/0.5ML IM SUSP: 0.5000 mL | Freq: Once | INTRAMUSCULAR | 0 refills | Status: AC

## 2023-11-24 MED ORDER — DILTIAZEM HCL ER COATED BEADS 120 MG PO CP24: 120.0000 mg | ORAL_CAPSULE | Freq: Every day | ORAL | 1 refills | Status: AC

## 2023-11-24 MED ORDER — GABAPENTIN 100 MG PO CAPS: 100.0000 mg | ORAL_CAPSULE | Freq: Two times a day (BID) | ORAL | 0 refills | Status: DC

## 2023-11-24 NOTE — Addendum Note (Signed)
 Addended by: CLAUDIS EUAL SAILOR on: 11/24/2023 02:32 PM   Modules accepted: Orders

## 2023-11-24 NOTE — Assessment & Plan Note (Signed)
 Doing well without CP or shortness of breath. Has not established with a local cardiologist and desires to hold off for now. Will continue BP, lipid control

## 2023-11-24 NOTE — Patient Instructions (Addendum)
 Take Ubrelvy 50 mg - one tablet at headache onset.  You can repeat the dose once after 2 hours if needed.  No more than 2 doses in a 24 hour period.  Take gabapentin  100 mg every evening for 1-2 weeks for tremor.  If needed, can increase dose to 100 mg twice a day OR 200 mg in the evening if the tremor is most troublesome at night.  I have reduced the dose of Diltiazem  to 120 mg per day.  Let me know after a few weeks if your blood pressure is still low and you are still lightheaded.

## 2023-11-24 NOTE — Assessment & Plan Note (Addendum)
 Head symptoms are getting more bothersome. Will start Gabapentin  100-200 mg QHS

## 2023-11-24 NOTE — Assessment & Plan Note (Signed)
 On Atorvastatin  40 mg without side effects. Lab Results  Component Value Date   LDLCALC 97 05/22/2023  Goal LD < 55 Will recheck labs.

## 2023-11-24 NOTE — Assessment & Plan Note (Addendum)
 Migraines has resolved until three weeks ago had a ocular migraine but no headache. She is concerned about recurrence. Will give samples of Ubrelvy 50 mg to use prn

## 2023-11-24 NOTE — Progress Notes (Signed)
 Date:  11/24/2023   Name:  Diana Mcdaniel   DOB:  [birth year]   MRN:  [id number]   Chief Complaint: Hypertension  Hypertension This is a chronic problem. The problem is controlled. Pertinent negatives include no chest pain, headaches, palpitations or shortness of breath. Past treatments include angiotensin blockers and calcium  channel blockers. The current treatment provides significant improvement. Hypertensive end-organ damage includes CAD/MI. There is no history of kidney disease or CVA.  Hyperlipidemia This is a chronic problem. The problem is controlled. Recent lipid tests were reviewed and are variable. Pertinent negatives include no chest pain, myalgias or shortness of breath. Current antihyperlipidemic treatment includes statins.    Review of Systems  Constitutional:  Negative for fatigue and unexpected weight change.  HENT:  Negative for trouble swallowing.   Eyes:  Negative for visual disturbance.  Respiratory:  Negative for cough, chest tightness, shortness of breath and wheezing.   Cardiovascular:  Negative for chest pain, palpitations and leg swelling.  Gastrointestinal:  Negative for abdominal pain, constipation and diarrhea.  Musculoskeletal:  Negative for arthralgias and myalgias.  Neurological:  Negative for dizziness, weakness, light-headedness and headaches.     Lab Results  Component Value Date   NA 135 05/22/2023   K 4.8 05/22/2023   CO2 24 05/22/2023   GLUCOSE 99 05/22/2023   BUN 9 05/22/2023   CREATININE 0.74 05/22/2023   CALCIUM  9.6 05/22/2023   EGFR 85 05/22/2023   GFRNONAA 69 05/04/2020   Lab Results  Component Value Date   CHOL 186 05/22/2023   HDL 69 05/22/2023   LDLCALC 97 05/22/2023   TRIG 111 05/22/2023   CHOLHDL 2.7 05/22/2023   Lab Results  Component Value Date   TSH 3.480 05/22/2023   Lab Results  Component Value Date   HGBA1C 5.5 05/04/2020   Lab Results  Component Value Date   WBC 5.0 05/22/2023   HGB 13.0 05/22/2023    HCT 38.5 05/22/2023   MCV 88 05/22/2023   PLT 309 05/22/2023   Lab Results  Component Value Date   ALT 18 05/22/2023   AST 24 05/22/2023   ALKPHOS 82 05/22/2023   BILITOT 0.7 05/22/2023   Lab Results  Component Value Date   VD25OH 41.8 05/04/2020     Patient Active Problem List   Diagnosis Date Noted   History of non-ST elevation myocardial infarction (NSTEMI) 07/03/2023   Primary insomnia 05/03/2019   Family history of colon cancer in father    Polyp of sigmoid colon    Osteopenia determined by x-ray 05/26/2018   Sarcoidosis of lung (HCC) 04/29/2018   Migraine headache with aura 04/29/2018   Tremor, essential 04/29/2018   Panic disorder 04/29/2018   Essential hypertension 12/01/2017   Mixed hyperlipidemia 12/01/2017   Ganglion cyst of foot 12/01/2017   Major depressive disorder with single episode, in partial remission (HCC) 12/01/2017   Primary osteoarthritis of left hip 12/01/2017    No Known Allergies  Past Surgical History:  Procedure Laterality Date   CARDIAC CATHETERIZATION  2010   Normal   COLONOSCOPY N/A 10/02/2023   Procedure: COLONOSCOPY;  Surgeon: Jinny Carmine, MD;  Location: [facility] ENDOSCOPY;  Service: Endoscopy;  Laterality: N/A;   COLONOSCOPY WITH PROPOFOL  N/A 09/28/2018   Procedure: COLONOSCOPY WITH  biopsy;  Surgeon: Jinny Carmine, MD;  Location: [facility] SURGERY CNTR;  Service: Endoscopy;  Laterality: N/A;   POLYPECTOMY  09/28/2018   Procedure: POLYPECTOMY;  Surgeon: Jinny Carmine, MD;  Location: [facility] SURGERY CNTR;  Service: Endoscopy;;  POLYPECTOMY  10/02/2023   Procedure: POLYPECTOMY, INTESTINE;  Surgeon: Jinny Carmine, MD;  Location: [facility] ENDOSCOPY;  Service: Endoscopy;;    Social History   Tobacco Use   Smoking status: Former    Current packs/day: 0.00    Average packs/day: 1 pack/day for 15.0 years (15.0 ttl pk-yrs)    Types: Cigarettes    Start date: 03/18/1978    Quit date: 03/18/1993    Years since quitting: 30.7   Smokeless tobacco: Never   Vaping Use   Vaping status: Never Used  Substance Use Topics   Alcohol use: Yes    Alcohol/week: 24.0 standard drinks of alcohol    Types: 24 Glasses of wine per week    Comment: 3-4 glasses wine/day   Drug use: Never     Medication list has been reviewed and updated.  Current Meds  Medication Sig   aspirin EC 81 MG tablet Take 81 mg by mouth daily. Swallow whole.   atorvastatin  (LIPITOR) 40 MG tablet Take 1 tablet (40 mg total) by mouth daily.   Calcium -Magnesium-Vitamin D (CALCIUM  1200+D3 PO) Take by mouth daily.   clonazePAM  (KLONOPIN ) 0.5 MG tablet TAKE 1 TABLET BY MOUTH TWICE A DAY AS NEEDED FOR ANXIETY   diltiazem  (CARDIZEM  CD) 120 MG 24 hr capsule Take 1 capsule (120 mg total) by mouth daily.   FLUoxetine  (PROZAC ) 20 MG capsule TAKE 1 CAPSULE BY MOUTH EVERY DAY   gabapentin  (NEURONTIN ) 100 MG capsule Take 1-2 capsules (100-200 mg total) by mouth 2 (two) times daily.   losartan  (COZAAR ) 25 MG tablet Take 1 tablet (25 mg total) by mouth daily.   triamcinolone  cream (KENALOG ) 0.1 % Apply 1 Application topically as directed. qd to bid aa rash on legs 5 days a week, avoid face, groin, axilla   [DISCONTINUED] diltiazem  (CARDIZEM  CD) 180 MG 24 hr capsule Take 1 capsule (180 mg total) by mouth daily.       11/24/2023   10:43 AM 09/03/2023   11:00 AM 07/03/2023    9:22 AM 05/22/2023   11:08 AM  GAD 7 : Generalized Anxiety Score  Nervous, Anxious, on Edge 1 1 0 0  Control/stop worrying 1 1 0 0  Worry too much - different things 0 0 0 0  Trouble relaxing 0 0 0 0  Restless 0 0 0 0  Easily annoyed or irritable 0 0 0 0  Afraid - awful might happen 0 1 0 0  Total GAD 7 Score 2 3 0 0  Anxiety Difficulty Not difficult at all Not difficult at all  Not difficult at all       11/24/2023   10:42 AM 09/03/2023   11:00 AM 07/03/2023    9:21 AM  Depression screen PHQ 2/9  Decreased Interest 0 0 0  Down, Depressed, Hopeless 0 0 0  PHQ - 2 Score 0 0 0  Altered sleeping 0 0   Tired,  decreased energy 1 3   Change in appetite 0 0   Feeling bad or failure about yourself  0    Trouble concentrating 0 0   Moving slowly or fidgety/restless 0 3   Suicidal thoughts 0 0   PHQ-9 Score 1 6   Difficult doing work/chores Not difficult at all Not difficult at all     BP Readings from Last 3 Encounters:  11/24/23 108/66  10/02/23 (!) 117/43  09/03/23 102/64    Physical Exam Vitals and nursing note reviewed.  Constitutional:      General:  She is not in acute distress.    Appearance: Normal appearance. She is well-developed.  HENT:     Head: Normocephalic and atraumatic.  Neck:     Vascular: No carotid bruit.  Cardiovascular:     Rate and Rhythm: Normal rate and regular rhythm.     Heart sounds: No murmur heard. Pulmonary:     Effort: Pulmonary effort is normal. No respiratory distress.     Breath sounds: No wheezing or rhonchi.  Musculoskeletal:     Cervical back: Normal range of motion.     Right lower leg: No edema.     Left lower leg: No edema.  Lymphadenopathy:     Cervical: No cervical adenopathy.  Skin:    General: Skin is warm and dry.     Capillary Refill: Capillary refill takes less than 2 seconds.     Findings: No rash.  Neurological:     General: No focal deficit present.     Mental Status: She is alert and oriented to person, place, and time.  Psychiatric:        Mood and Affect: Mood normal.        Behavior: Behavior normal.     Wt Readings from Last 3 Encounters:  11/24/23 200 lb (90.7 kg)  10/02/23 201 lb 3.2 oz (91.3 kg)  09/03/23 206 lb 6.4 oz (93.6 kg)    BP 108/66   Pulse 73   Ht 5' 8 (1.727 m)   Wt 200 lb (90.7 kg)   SpO2 98%   BMI 30.41 kg/m   Assessment and Plan:  Problem List Items Addressed This Visit       Unprioritized   Essential hypertension - Primary (Chronic)   Blood pressure is well controlled on losartan  and diltiazem  but she is having low readings and some lightheadedness. No chest pain or DOE.          Relevant Medications   diltiazem  (CARDIZEM  CD) 120 MG 24 hr capsule   Other Relevant Orders   Comprehensive metabolic panel with GFR   Mixed hyperlipidemia (Chronic)   On Atorvastatin  40 mg without side effects. Lab Results  Component Value Date   LDLCALC 97 05/22/2023  Goal LD < 55 Will recheck labs.      Relevant Medications   diltiazem  (CARDIZEM  CD) 120 MG 24 hr capsule   Other Relevant Orders   Comprehensive metabolic panel with GFR   Lipid panel   Migraine headache with aura (Chronic)   Migraines has resolved until three weeks ago had a ocular migraine but no headache. She is concerned about recurrence. Will give samples of Ubrelvy 50 mg to use prn       Relevant Medications   gabapentin  (NEURONTIN ) 100 MG capsule   diltiazem  (CARDIZEM  CD) 120 MG 24 hr capsule   Tremor, essential (Chronic)   Head symptoms are getting more bothersome. Will start Gabapentin  100-200 mg QHS      Relevant Medications   gabapentin  (NEURONTIN ) 100 MG capsule   History of non-ST elevation myocardial infarction (NSTEMI)   Doing well without CP or shortness of breath. Has not established with a local cardiologist and desires to hold off for now. Will continue BP, lipid control      Other Visit Diagnoses       Immunization due       Relevant Orders   Pfizer Comirnaty Covid -19 Vaccine 84yrs and older     Need for COVID-19 vaccine  Encounter for immunization       Relevant Orders   Flu vaccine HIGH DOSE PF(Fluzone Trivalent) (Completed)       Return in about 4 months (around 03/25/2024) for HTN TOC Dr. Lemon.    [name], MD [facility] [place]

## 2023-11-24 NOTE — Assessment & Plan Note (Addendum)
 Blood pressure is well controlled on losartan  and diltiazem  but she is having low readings and some lightheadedness. No chest pain or DOE.

## 2023-11-25 ENCOUNTER — Ambulatory Visit: Payer: Self-pay | Admitting: Internal Medicine

## 2023-11-25 LAB — COMPREHENSIVE METABOLIC PANEL WITH GFR
ALT: 18 IU/L (ref 0–32)
AST: 19 IU/L (ref 0–40)
Albumin: 4.5 g/dL (ref 3.8–4.8)
Alkaline Phosphatase: 112 IU/L (ref 44–121)
BUN/Creatinine Ratio: 13 (ref 12–28)
BUN: 13 mg/dL (ref 8–27)
Bilirubin Total: 0.5 mg/dL (ref 0.0–1.2)
CO2: 25 mmol/L (ref 20–29)
Calcium: 10.5 mg/dL — ABNORMAL HIGH (ref 8.7–10.3)
Chloride: 99 mmol/L (ref 96–106)
Creatinine, Ser: 1.04 mg/dL — ABNORMAL HIGH (ref 0.57–1.00)
Globulin, Total: 2.8 g/dL (ref 1.5–4.5)
Glucose: 92 mg/dL (ref 70–99)
Potassium: 5 mmol/L (ref 3.5–5.2)
Sodium: 137 mmol/L (ref 134–144)
Total Protein: 7.3 g/dL (ref 6.0–8.5)
eGFR: 56 mL/min/1.73 — ABNORMAL LOW (ref >59)

## 2023-11-25 LAB — LIPID PANEL
Chol/HDL Ratio: 2.3 ratio (ref 0.0–4.4)
Cholesterol, Total: 155 mg/dL (ref 100–199)
HDL: 68 mg/dL (ref >39)
LDL Chol Calc (NIH): 67 mg/dL (ref 0–99)
Triglycerides: 110 mg/dL (ref 0–149)
VLDL Cholesterol Cal: 20 mg/dL (ref 5–40)

## 2023-12-01 ENCOUNTER — Other Ambulatory Visit: Payer: Self-pay | Admitting: Internal Medicine

## 2023-12-01 ENCOUNTER — Ambulatory Visit: Admitting: Internal Medicine

## 2023-12-01 ENCOUNTER — Encounter: Payer: Self-pay | Admitting: Internal Medicine

## 2023-12-01 VITALS — BP 118/74 | HR 80 | Ht 68.0 in | Wt 203.0 lb

## 2023-12-01 DIAGNOSIS — L601 Onycholysis: Secondary | ICD-10-CM | POA: Diagnosis not present

## 2023-12-01 DIAGNOSIS — L03031 Cellulitis of right toe: Secondary | ICD-10-CM

## 2023-12-01 DIAGNOSIS — F410 Panic disorder [episodic paroxysmal anxiety] without agoraphobia: Secondary | ICD-10-CM

## 2023-12-01 MED ORDER — DOXYCYCLINE HYCLATE 100 MG PO TABS: 100.0000 mg | ORAL_TABLET | Freq: Two times a day (BID) | ORAL | 0 refills | Status: AC

## 2023-12-01 NOTE — Progress Notes (Signed)
 Date:  12/01/2023   Name:  Diana Mcdaniel   DOB:  November 07, 1950   MRN:  [id number]   Chief Complaint: Nail Problem (Big toe pain, does not remember having any injury to her foot, swollen, redness, painful when bending big toe back)  Toe Pain  Incident onset: left great toenail loosened and thick/yellow. There was no injury mechanism. The pain is present in the left toes. The pain is mild. The pain has been Constant since onset. The symptoms are aggravated by palpation. She has tried nothing for the symptoms.  She removed polish that was placed about a month ago and noticed the nail changes.  She also has mild swelling and some redness around the base of the nail.  Review of Systems  Constitutional:  Negative for fatigue.  Respiratory:  Negative for chest tightness and shortness of breath.   Cardiovascular:  Negative for chest pain.  Skin:  Positive for color change.     Lab Results  Component Value Date   NA 137 11/24/2023   K 5.0 11/24/2023   CO2 25 11/24/2023   GLUCOSE 92 11/24/2023   BUN 13 11/24/2023   CREATININE 1.04 (H) 11/24/2023   CALCIUM  10.5 (H) 11/24/2023   EGFR 56 (L) 11/24/2023   GFRNONAA 69 05/04/2020   Lab Results  Component Value Date   CHOL 155 11/24/2023   HDL 68 11/24/2023   LDLCALC 67 11/24/2023   TRIG 110 11/24/2023   CHOLHDL 2.3 11/24/2023   Lab Results  Component Value Date   TSH 3.480 05/22/2023   Lab Results  Component Value Date   HGBA1C 5.5 05/04/2020   Lab Results  Component Value Date   WBC 5.0 05/22/2023   HGB 13.0 05/22/2023   HCT 38.5 05/22/2023   MCV 88 05/22/2023   PLT 309 05/22/2023   Lab Results  Component Value Date   ALT 18 11/24/2023   AST 19 11/24/2023   ALKPHOS 112 11/24/2023   BILITOT 0.5 11/24/2023   Lab Results  Component Value Date   VD25OH 41.8 05/04/2020     Patient Active Problem List   Diagnosis Date Noted   History of non-ST elevation myocardial infarction (NSTEMI) 07/03/2023   Primary insomnia  05/03/2019   Family history of colon cancer in father    Polyp of sigmoid colon    Osteopenia determined by x-ray 05/26/2018   Sarcoidosis of lung (HCC) 04/29/2018   Migraine headache with aura 04/29/2018   Tremor, essential 04/29/2018   Panic disorder 04/29/2018   Essential hypertension 12/01/2017   Mixed hyperlipidemia 12/01/2017   Ganglion cyst of foot 12/01/2017   Major depressive disorder with single episode, in partial remission (HCC) 12/01/2017   Primary osteoarthritis of left hip 12/01/2017    No Known Allergies  Past Surgical History:  Procedure Laterality Date   CARDIAC CATHETERIZATION  2010   Normal   COLONOSCOPY N/A 10/02/2023   Procedure: COLONOSCOPY;  Surgeon: Jinny Carmine, MD;  Location: [facility] ENDOSCOPY;  Service: Endoscopy;  Laterality: N/A;   COLONOSCOPY WITH PROPOFOL  N/A 09/28/2018   Procedure: COLONOSCOPY WITH  biopsy;  Surgeon: Jinny Carmine, MD;  Location: [facility] SURGERY CNTR;  Service: Endoscopy;  Laterality: N/A;   POLYPECTOMY  09/28/2018   Procedure: POLYPECTOMY;  Surgeon: Jinny Carmine, MD;  Location: [facility] SURGERY CNTR;  Service: Endoscopy;;   POLYPECTOMY  10/02/2023   Procedure: POLYPECTOMY, INTESTINE;  Surgeon: Jinny Carmine, MD;  Location: [facility] ENDOSCOPY;  Service: Endoscopy;;    Social History   Tobacco Use  Smoking status: Former    Current packs/day: 0.00    Average packs/day: 1 pack/day for 15.0 years (15.0 ttl pk-yrs)    Types: Cigarettes    Start date: 03/18/1978    Quit date: 03/18/1993    Years since quitting: 30.7   Smokeless tobacco: Never  Vaping Use   Vaping status: Never Used  Substance Use Topics   Alcohol use: Yes    Alcohol/week: 24.0 standard drinks of alcohol    Types: 24 Glasses of wine per week    Comment: 3-4 glasses wine/day   Drug use: Never     Medication list has been reviewed and updated.  Current Meds  Medication Sig   aspirin EC 81 MG tablet Take 81 mg by mouth daily. Swallow whole.   atorvastatin  (LIPITOR) 40 MG  tablet Take 1 tablet (40 mg total) by mouth daily.   Calcium -Magnesium-Vitamin D (CALCIUM  1200+D3 PO) Take by mouth daily.   clonazePAM  (KLONOPIN ) 0.5 MG tablet TAKE 1 TABLET BY MOUTH TWICE A DAY AS NEEDED FOR ANXIETY   diltiazem  (CARDIZEM  CD) 120 MG 24 hr capsule Take 1 capsule (120 mg total) by mouth daily.   doxycycline  (VIBRA -TABS) 100 MG tablet Take 1 tablet (100 mg total) by mouth 2 (two) times daily for 10 days.   FLUoxetine  (PROZAC ) 20 MG capsule TAKE 1 CAPSULE BY MOUTH EVERY DAY   gabapentin  (NEURONTIN ) 100 MG capsule Take 1-2 capsules (100-200 mg total) by mouth 2 (two) times daily.   losartan  (COZAAR ) 25 MG tablet Take 1 tablet (25 mg total) by mouth daily.   triamcinolone  cream (KENALOG ) 0.1 % APPLY DAILY TO TWICE DAILY AS DIRECTED TO AFFECTED AREA RASH ON LEGS 5 DAYS A WEEK, AVOID FACE, GROIN, AXILLA       12/01/2023    3:20 PM 11/24/2023   10:43 AM 09/03/2023   11:00 AM 07/03/2023    9:22 AM  GAD 7 : Generalized Anxiety Score  Nervous, Anxious, on Edge 0 1 1 0  Control/stop worrying 0 1 1 0  Worry too much - different things 0 0 0 0  Trouble relaxing 0 0 0 0  Restless 0 0 0 0  Easily annoyed or irritable 0 0 0 0  Afraid - awful might happen 0 0 1 0  Total GAD 7 Score 0 2 3 0  Anxiety Difficulty Not difficult at all Not difficult at all Not difficult at all        12/01/2023    3:20 PM 11/24/2023   10:42 AM 09/03/2023   11:00 AM  Depression screen PHQ 2/9  Decreased Interest 0 0 0  Down, Depressed, Hopeless 0 0 0  PHQ - 2 Score 0 0 0  Altered sleeping 0 0 0  Tired, decreased energy 0 1 3  Change in appetite 0 0 0  Feeling bad or failure about yourself  0 0   Trouble concentrating 0 0 0  Moving slowly or fidgety/restless 0 0 3  Suicidal thoughts 0 0 0  PHQ-9 Score 0 1 6  Difficult doing work/chores Not difficult at all Not difficult at all Not difficult at all    BP Readings from Last 3 Encounters:  12/01/23 118/74  11/24/23 108/66  10/02/23 (!) 117/43     Physical Exam Vitals and nursing note reviewed.  Constitutional:      General: She is not in acute distress.    Appearance: She is well-developed.  HENT:     Head: Normocephalic and atraumatic.  Pulmonary:  Effort: Pulmonary effort is normal. No respiratory distress.  Musculoskeletal:     Comments: Left great toe nail thick and yellow; loosening from the nail bed about 1/2 way down. Skin around the cuticle is red, warm, tender and slightly swollen. No drainage noted.  Skin:    General: Skin is warm and dry.     Findings: No rash.  Neurological:     Mental Status: She is alert and oriented to person, place, and time.  Psychiatric:        Mood and Affect: Mood normal.        Behavior: Behavior normal.     Wt Readings from Last 3 Encounters:  12/01/23 203 lb (92.1 kg)  11/24/23 200 lb (90.7 kg)  10/02/23 201 lb 3.2 oz (91.3 kg)    BP 118/74   Pulse 80   Ht 5' 8 (1.727 m)   Wt 203 lb (92.1 kg)   SpO2 96%   BMI 30.87 kg/m   Assessment and Plan:  Problem List Items Addressed This Visit   None Visit Diagnoses       Cellulitis of great toe of right foot    -  Primary   elevate to reduce swelling Doxycycline  bid for early skin infection   Relevant Medications   doxycycline  (VIBRA -TABS) 100 MG tablet     Onycholysis of toenail       keep nail protected with a bandaid See podiatry if the nail does not avulse on its own.       No follow-ups on file.    [name], MD [facility] [place]

## 2023-12-02 NOTE — Telephone Encounter (Signed)
 Please review.  KP

## 2023-12-02 NOTE — Telephone Encounter (Signed)
 Requested medications are due for refill today.  yes  Requested medications are on the active medications list.  yes  Last refill. 09/03/2023 #20 01 rf  Future visit scheduled.   yes  Notes to clinic.  Refill not delegated.    Requested Prescriptions  Pending Prescriptions Disp Refills   clonazePAM  (KLONOPIN ) 0.5 MG tablet [Pharmacy Med Name: CLONAZEPAM  0.5 MG TABLET] 20 tablet 1    Sig: TAKE 1 TABLET BY MOUTH TWICE A DAY AS NEEDED FOR ANXIETY     Not Delegated - Psychiatry: Anxiolytics/Hypnotics 2 Failed - 12/02/2023  4:39 PM      Failed - This refill cannot be delegated      Failed - Urine Drug Screen completed in last 360 days      Passed - Patient is not pregnant      Passed - Valid encounter within last 6 months    Recent Outpatient Visits           Yesterday Cellulitis of great toe of right foot   [place] [facility], [name], MD   1 week ago Essential hypertension   [place] [facility], [name], MD   3 months ago Essential hypertension   [place] [facility] at [facility], [name], MD   5 months ago NSTEMI (non-ST elevated myocardial infarction) [facility])   [place] [facility] at [facility], [name], MD   6 months ago Annual physical exam   [facility] at [facility], [name], MD       Future Appointments             In 2 months Hester Alm BROCKS, MD [facility]

## 2023-12-08 DIAGNOSIS — M79674 Pain in right toe(s): Secondary | ICD-10-CM | POA: Diagnosis not present

## 2023-12-08 DIAGNOSIS — L03031 Cellulitis of right toe: Secondary | ICD-10-CM | POA: Diagnosis not present

## 2023-12-08 DIAGNOSIS — D860 Sarcoidosis of lung: Secondary | ICD-10-CM | POA: Diagnosis not present

## 2023-12-08 DIAGNOSIS — B351 Tinea unguium: Secondary | ICD-10-CM | POA: Diagnosis not present

## 2023-12-19 ENCOUNTER — Ambulatory Visit
Admission: EM | Admit: 2023-12-19 | Discharge: 2023-12-19 | Disposition: A | Discharge status: Home / Self Care | Attending: Emergency Medicine | Admitting: Emergency Medicine

## 2023-12-19 DIAGNOSIS — S46011A Strain of muscle(s) and tendon(s) of the rotator cuff of right shoulder, initial encounter: Secondary | ICD-10-CM | POA: Diagnosis not present

## 2023-12-19 MED ORDER — BACLOFEN 5 MG PO TABS: 5.0000 mg | ORAL_TABLET | Freq: Three times a day (TID) | ORAL | 0 refills | Status: DC | PRN

## 2023-12-19 MED ORDER — IBUPROFEN 600 MG PO TABS: 600.0000 mg | ORAL_TABLET | Freq: Four times a day (QID) | ORAL | 0 refills | Status: AC | PRN

## 2023-12-19 NOTE — ED Triage Notes (Signed)
 Patient to Urgent Care with complaints of right sided shoulder pain that radiates into her hand.   Symptoms x3-4 days. Denies any known injury. Reports she lifted heavy luggage over the weekend.   Taking tylenol  w/ some relief.

## 2023-12-19 NOTE — ED Provider Notes (Signed)
 [facility]    CSN: [id number] Arrival date & time: 12/19/23  1037      History   Chief Complaint Chief Complaint  Patient presents with   Shoulder Pain    HPI Diana Mcdaniel is a 73 y.o. female.   HPI  73 year old female with past medical history significant for NSTEMI, migraine headaches, major depressive disorder, hypertension, hyperlipidemia, elevated TSH, and anxiety presents for evaluation of 3 to 4 days worth of global right shoulder pain.  She reports that prior to the onset of symptoms she had been at the beach and was carrying heavy luggage.  She cannot remember any discrete injury.  She does have intermittent tingling that goes to her hand but it is not present currently.  No numbness or weakness.  She did take 2 dual action Tylenol /ibuprofen tablets prior to arrival without any significant improvement of pain.  Past Medical History:  Diagnosis Date   Anxiety    Arthritis 2005   Elevated TSH 11/02/2019   Normal T4 04/2019   Hyperlipidemia    Hypertension    Major depressive disorder with single episode, in partial remission    Migraine headache    1-2x/year   NSTEMI (non-ST elevated myocardial infarction) (HCC) 07/03/2023   Sarcoidosis     Patient Active Problem List   Diagnosis Date Noted   History of non-ST elevation myocardial infarction (NSTEMI) 07/03/2023   Primary insomnia 05/03/2019   Family history of colon cancer in father    Polyp of sigmoid colon    Osteopenia determined by x-ray 05/26/2018   Sarcoidosis of lung 04/29/2018   Migraine headache with aura 04/29/2018   Tremor, essential 04/29/2018   Panic disorder 04/29/2018   Essential hypertension 12/01/2017   Mixed hyperlipidemia 12/01/2017   Ganglion cyst of foot 12/01/2017   Major depressive disorder with single episode, in partial remission 12/01/2017   Primary osteoarthritis of left hip 12/01/2017    Past Surgical History:  Procedure Laterality Date   CARDIAC  CATHETERIZATION  2010   Normal   COLONOSCOPY N/A 10/02/2023   Procedure: COLONOSCOPY;  Surgeon: Jinny Carmine, MD;  Location: [facility] ENDOSCOPY;  Service: Endoscopy;  Laterality: N/A;   COLONOSCOPY WITH PROPOFOL  N/A 09/28/2018   Procedure: COLONOSCOPY WITH  biopsy;  Surgeon: Jinny Carmine, MD;  Location[facility];  Service: Endoscopy;  Laterality: N/A;   POLYPECTOMY  09/28/2018   Procedure: POLYPECTOMY;  Surgeon: Jinny Carmine, MD;  Location: [facility];  Service: Endoscopy;;   POLYPECTOMY  10/02/2023   Procedure: POLYPECTOMY, INTESTINE;  Surgeon: Jinny Carmine, MD;  Location: [facility] ENDOSCOPY;  Service: Endoscopy;;    OB History   No obstetric history on file.      Home Medications    Prior to Admission medications   Medication Sig Start Date End Date Taking? Authorizing Provider  aspirin EC 81 MG tablet Take 81 mg by mouth daily. Swallow whole.   Yes [provider]  atorvastatin  (LIPITOR) 40 MG tablet Take 1 tablet (40 mg total) by mouth daily. 09/03/23  Yes Justus [name], MD  Baclofen 5 MG TABS Take 1 tablet (5 mg total) by mouth 3 (three) times daily as needed. 12/19/23  Yes Bernardino Ditch, NP  Calcium -Magnesium-Vitamin D (CALCIUM  1200+D3 PO) Take by mouth daily.   Yes [provider]  diltiazem  (CARDIZEM  CD) 120 MG 24 hr capsule Take 1 capsule (120 mg total) by mouth daily. 11/24/23  Yes Justus [name], MD  FLUoxetine  (PROZAC ) 20 MG capsule TAKE  1 CAPSULE BY MOUTH EVERY DAY 05/22/23  Yes Justus [name], MD  gabapentin  (NEURONTIN ) 100 MG capsule Take 1-2 capsules (100-200 mg total) by mouth 2 (two) times daily. 11/24/23  Yes Berglund, Laura H, MD  ibuprofen (ADVIL) 600 MG tablet Take 1 tablet (600 mg total) by mouth every 6 (six) hours as needed. 12/19/23  Yes Bernardino Ditch, NP  losartan  (COZAAR ) 25 MG tablet Take 1 tablet (25 mg total) by mouth daily. 05/22/23  Yes Justus [name], MD  clonazePAM  (KLONOPIN ) 0.5 MG tablet TAKE 1 TABLET BY MOUTH TWICE A DAY  AS NEEDED FOR ANXIETY 12/02/23   Justus [name], MD  triamcinolone  cream (KENALOG ) 0.1 % APPLY DAILY TO TWICE DAILY AS DIRECTED TO AFFECTED AREA RASH ON LEGS 5 DAYS A WEEK, AVOID FACE, GROIN, AXILLA 11/24/23   Hester Alm BROCKS, MD    Family History Family History  Problem Relation Age of Onset   Colon cancer Father    Skin cancer Sister    Breast cancer Sister 39   Skin cancer Brother    Breast cancer Maternal Grandmother 51   Diabetes Neg Hx    Heart disease Neg Hx    Hypertension Neg Hx     Social History Social History   Tobacco Use   Smoking status: Former    Current packs/day: 0.00    Average packs/day: 1 pack/day for 15.0 years (15.0 ttl pk-yrs)    Types: Cigarettes    Start date: 03/18/1978    Quit date: 03/18/1993    Years since quitting: 30.7   Smokeless tobacco: Never  Vaping Use   Vaping status: Never Used  Substance Use Topics   Alcohol use: Not Currently    Alcohol/week: 24.0 standard drinks of alcohol    Types: 24 Glasses of wine per week    Comment: 3-4 glasses wine/day   Drug use: Never     Allergies   Patient has no known allergies.   Review of Systems Review of Systems  Musculoskeletal:  Positive for arthralgias. Negative for joint swelling.  Skin:  Negative for color change.  Neurological:  Negative for weakness and numbness.     Physical Exam Triage Vital Signs ED Triage Vitals  Encounter Vitals Group     BP      Girls Systolic BP Percentile      Girls Diastolic BP Percentile      Boys Systolic BP Percentile      Boys Diastolic BP Percentile      Pulse      Resp      Temp      Temp src      SpO2      Weight      Height      Head Circumference      Peak Flow      Pain Score      Pain Loc      Pain Education      Exclude from Growth Chart    No data found.  Updated Vital Signs BP 101/77   Pulse 68   Temp 98.1 F (36.7 C)   Resp 18   SpO2 96%   Visual Acuity Right Eye Distance:   Left Eye Distance:   Bilateral  Distance:    Right Eye Near:   Left Eye Near:    Bilateral Near:     Physical Exam Vitals and nursing note reviewed.  Constitutional:      Appearance: Normal appearance. She is not  ill-appearing.  HENT:     Head: Normocephalic and atraumatic.  Musculoskeletal:        General: Tenderness present. No swelling or signs of injury. Normal range of motion.  Skin:    General: Skin is warm and dry.     Capillary Refill: Capillary refill takes less than 2 seconds.     Findings: No bruising or erythema.  Neurological:     General: No focal deficit present.     Mental Status: She is alert and oriented to person, place, and time.     Motor: No weakness.      UC Treatments / Results  Labs (all labs ordered are listed, but only abnormal results are displayed) Labs Reviewed - No data to display  EKG   Radiology No results found.  Procedures Procedures (including critical care time)  Medications Ordered in UC Medications - No data to display  Initial Impression / Assessment and Plan / UC Course  I have reviewed the triage vital signs and the nursing notes.  Pertinent labs & imaging results that were available during my care of the patient were reviewed by me and considered in my medical decision making (see chart for details).   Patient is a pleasant, nontoxic-appearing 73 year old female presenting for evaluation of 3 to 4 days with a right shoulder pain as outlined in HPI above.  In the exam room her right shoulder is in normal anatomical alignment and she has full range of motion.  No increase in pain with resisted flexion or extension of her upper arm, or with resisted abduction.  She does complain of moderate tenderness with palpation of the superior aspect and posterior aspect of her shoulder girdle as well as with palpation of the deltoid muscle complex.  Anterior glenohumeral joint is nontender to palpation.  Radial and ulnar pulses are 2+.  She does report increased pain with  resisted empty soup can test though she is able to resist downward pressure.  This suggest that she has a rotator cuff strain.  I will treat her for rotator cuff strain with 600 mg ibuprofen every 6 hours with food coupled with 5 mg of baclofen every 8 hours.  We discussed using moist heat and range of motion exercises for home physical therapy.  If her symptoms do not improve I recommended that she follow-up with orthopedics such as [facility] here in [place] or in [place].   Final Clinical Impressions(s) / UC Diagnoses   Final diagnoses:  Rotator cuff strain, right, initial encounter     Discharge Instructions      Take the ibuprofen, 600 mg every 6 hours with food, on a schedule for the next 48 hours and then as needed.  Take the baclofen, 5 mg every 8 hours, on a schedule for the next 48 hours and then as needed.  Apply moist heat to your shoulder for 30 minutes at a time 2-3 times a day to improve blood flow to the area and help remove the lactic acid causing the spasm.  Follow the rotator cuff exercises given at discharge.  If you do not have any improvement of your symptoms in the next 7 to 10 days, or if your symptoms worsen, I would recommend following up with orthopedics such as [facility] here in [place] or in [place].     ED Prescriptions     Medication Sig Dispense Auth. Provider   ibuprofen (ADVIL) 600 MG tablet Take 1 tablet (600 mg total) by mouth every 6 (six)  hours as needed. 30 tablet Bernardino Ditch, NP   Baclofen 5 MG TABS Take 1 tablet (5 mg total) by mouth 3 (three) times daily as needed. 30 tablet Bernardino Ditch, NP      PDMP not reviewed this encounter.   Bernardino Ditch, NP 12/19/23 1110

## 2023-12-19 NOTE — Discharge Instructions (Addendum)
 Take the ibuprofen, 600 mg every 6 hours with food, on a schedule for the next 48 hours and then as needed.  Take the baclofen, 5 mg every 8 hours, on a schedule for the next 48 hours and then as needed.  Apply moist heat to your shoulder for 30 minutes at a time 2-3 times a day to improve blood flow to the area and help remove the lactic acid causing the spasm.  Follow the rotator cuff exercises given at discharge.  If you do not have any improvement of your symptoms in the next 7 to 10 days, or if your symptoms worsen, I would recommend following up with orthopedics such as [facility] here in [place] or in [place].

## 2024-02-02 ENCOUNTER — Other Ambulatory Visit: Payer: Self-pay | Admitting: Internal Medicine

## 2024-02-02 DIAGNOSIS — G250 Essential tremor: Secondary | ICD-10-CM

## 2024-02-04 ENCOUNTER — Telehealth: Payer: Self-pay

## 2024-02-04 NOTE — Telephone Encounter (Signed)
 Copied from CRM #[id number]. Topic: Clinical - Prescription Issue >> Feb 04, 2024  3:53 PM Hadassah PARAS wrote: Reason for CRM: Pt was calling to follow up on gabapentin  (NEURONTIN ) 100 MG capsule. Advised it may take up to 3 business days from when it was first requested.

## 2024-02-05 ENCOUNTER — Other Ambulatory Visit: Payer: Self-pay | Admitting: Internal Medicine

## 2024-02-05 ENCOUNTER — Ambulatory Visit: Payer: Medicare PPO | Admitting: Dermatology

## 2024-02-05 DIAGNOSIS — D229 Melanocytic nevi, unspecified: Secondary | ICD-10-CM

## 2024-02-05 DIAGNOSIS — L814 Other melanin hyperpigmentation: Secondary | ICD-10-CM | POA: Diagnosis not present

## 2024-02-05 DIAGNOSIS — G250 Essential tremor: Secondary | ICD-10-CM

## 2024-02-05 DIAGNOSIS — Z1283 Encounter for screening for malignant neoplasm of skin: Secondary | ICD-10-CM

## 2024-02-05 DIAGNOSIS — D1801 Hemangioma of skin and subcutaneous tissue: Secondary | ICD-10-CM

## 2024-02-05 DIAGNOSIS — Z7189 Other specified counseling: Secondary | ICD-10-CM | POA: Diagnosis not present

## 2024-02-05 DIAGNOSIS — W908XXA Exposure to other nonionizing radiation, initial encounter: Secondary | ICD-10-CM

## 2024-02-05 DIAGNOSIS — C84A0 Cutaneous T-cell lymphoma, unspecified, unspecified site: Secondary | ICD-10-CM | POA: Diagnosis not present

## 2024-02-05 DIAGNOSIS — L578 Other skin changes due to chronic exposure to nonionizing radiation: Secondary | ICD-10-CM | POA: Diagnosis not present

## 2024-02-05 DIAGNOSIS — L821 Other seborrheic keratosis: Secondary | ICD-10-CM

## 2024-02-05 DIAGNOSIS — Z79899 Other long term (current) drug therapy: Secondary | ICD-10-CM

## 2024-02-05 DIAGNOSIS — L719 Rosacea, unspecified: Secondary | ICD-10-CM

## 2024-02-05 MED ORDER — GABAPENTIN 100 MG PO CAPS: 100.0000 mg | ORAL_CAPSULE | Freq: Two times a day (BID) | ORAL | 1 refills | Status: AC

## 2024-02-05 MED ORDER — TRIAMCINOLONE ACETONIDE 0.1 % EX CREA
TOPICAL_CREAM | CUTANEOUS | 2 refills | Status: AC
Start: 2024-02-05 — End: ?

## 2024-02-05 NOTE — Patient Instructions (Addendum)

## 2024-02-05 NOTE — Progress Notes (Unsigned)
 Date:  02/05/2024   Name:  Diana Mcdaniel   DOB:  [birth year]   MRN:  [id number]   Chief Complaint: No chief complaint on file.  HPI  Review of Systems   Lab Results  Component Value Date   NA 137 11/24/2023   K 5.0 11/24/2023   CO2 25 11/24/2023   GLUCOSE 92 11/24/2023   BUN 13 11/24/2023   CREATININE 1.04 (H) 11/24/2023   CALCIUM  10.5 (H) 11/24/2023   EGFR 56 (L) 11/24/2023   GFRNONAA 69 05/04/2020   Lab Results  Component Value Date   CHOL 155 11/24/2023   HDL 68 11/24/2023   LDLCALC 67 11/24/2023   TRIG 110 11/24/2023   CHOLHDL 2.3 11/24/2023   Lab Results  Component Value Date   TSH 3.480 05/22/2023   Lab Results  Component Value Date   HGBA1C 5.5 05/04/2020   Lab Results  Component Value Date   WBC 5.0 05/22/2023   HGB 13.0 05/22/2023   HCT 38.5 05/22/2023   MCV 88 05/22/2023   PLT 309 05/22/2023   Lab Results  Component Value Date   ALT 18 11/24/2023   AST 19 11/24/2023   ALKPHOS 112 11/24/2023   BILITOT 0.5 11/24/2023   Lab Results  Component Value Date   VD25OH 41.8 05/04/2020     Patient Active Problem List   Diagnosis Date Noted   History of non-ST elevation myocardial infarction (NSTEMI) 07/03/2023   Primary insomnia 05/03/2019   Family history of colon cancer in father    Polyp of sigmoid colon    Osteopenia determined by x-ray 05/26/2018   Sarcoidosis of lung 04/29/2018   Migraine headache with aura 04/29/2018   Tremor, essential 04/29/2018   Panic disorder 04/29/2018   Essential hypertension 12/01/2017   Mixed hyperlipidemia 12/01/2017   Ganglion cyst of foot 12/01/2017   Major depressive disorder with single episode, in partial remission 12/01/2017   Primary osteoarthritis of left hip 12/01/2017    No Known Allergies  Past Surgical History:  Procedure Laterality Date   CARDIAC CATHETERIZATION  2010   Normal   COLONOSCOPY N/A 10/02/2023   Procedure: COLONOSCOPY;  Surgeon: Jinny Carmine, MD;  Location: [facility]  ENDOSCOPY;  Service: Endoscopy;  Laterality: N/A;   COLONOSCOPY WITH PROPOFOL  N/A 09/28/2018   Procedure: COLONOSCOPY WITH  biopsy;  Surgeon: Jinny Carmine, MD;  Location: [facility];  Service: Endoscopy;  Laterality: N/A;   POLYPECTOMY  09/28/2018   Procedure: POLYPECTOMY;  Surgeon: Jinny Carmine, MD;  Location: [facility];  Service: Endoscopy;;   POLYPECTOMY  10/02/2023   Procedure: POLYPECTOMY, INTESTINE;  Surgeon: Jinny Carmine, MD;  Location: [facility] ENDOSCOPY;  Service: Endoscopy;;    Social History   Tobacco Use   Smoking status: Former    Current packs/day: 0.00    Average packs/day: 1 pack/day for 15.0 years (15.0 ttl pk-yrs)    Types: Cigarettes    Start date: 03/18/1978    Quit date: 03/18/1993    Years since quitting: 30.9   Smokeless tobacco: Never  Vaping Use   Vaping status: Never Used  Substance Use Topics   Alcohol use: Not Currently    Alcohol/week: 24.0 standard drinks of alcohol    Types: 24 Glasses of wine per week    Comment: 3-4 glasses wine/day   Drug use: Never     Medication list has been reviewed and updated.  No outpatient medications have been marked as taking for the 02/05/24 encounter (Orders Only) with [name],  [name], MD.       12/01/2023    3:20 PM 11/24/2023   10:43 AM 09/03/2023   11:00 AM 07/03/2023    9:22 AM  GAD 7 : Generalized Anxiety Score  Nervous, Anxious, on Edge 0 1 1 0  Control/stop worrying 0 1 1 0  Worry too much - different things 0 0 0 0  Trouble relaxing 0 0 0 0  Restless 0 0 0 0  Easily annoyed or irritable 0 0 0 0  Afraid - awful might happen 0 0 1 0  Total GAD 7 Score 0 2 3 0  Anxiety Difficulty Not difficult at all Not difficult at all Not difficult at all        12/01/2023    3:20 PM 11/24/2023   10:42 AM 09/03/2023   11:00 AM  Depression screen PHQ 2/9  Decreased Interest 0 0 0  Down, Depressed, Hopeless 0 0 0  PHQ - 2 Score 0 0 0  Altered sleeping 0 0 0  Tired, decreased energy 0 1 3  Change in  appetite 0 0 0  Feeling bad or failure about yourself  0 0   Trouble concentrating 0 0 0  Moving slowly or fidgety/restless 0 0 3  Suicidal thoughts 0 0 0  PHQ-9 Score 0  1  6   Difficult doing work/chores Not difficult at all Not difficult at all Not difficult at all     Data saved with a previous flowsheet row definition    BP Readings from Last 3 Encounters:  12/19/23 101/77  12/01/23 118/74  11/24/23 108/66    Physical Exam  Wt Readings from Last 3 Encounters:  12/01/23 203 lb (92.1 kg)  11/24/23 200 lb (90.7 kg)  10/02/23 201 lb 3.2 oz (91.3 kg)    There were no vitals taken for this visit.  Assessment and Plan:  Problem List Items Addressed This Visit   None   No follow-ups on file.    [name], MD [facility] [place]

## 2024-02-05 NOTE — Telephone Encounter (Signed)
 Please review.  KP

## 2024-02-05 NOTE — Progress Notes (Addendum)
 Follow-Up Visit   Subjective  Diana Mcdaniel is a 73 y.o. female who presents for the following: Skin Cancer Screening and Full Body Skin Exam. Place at top of sclpa, bumps underarms unable to shave.   The patient presents for Total-Body Skin Exam (TBSE) for skin cancer screening and mole check. The patient has spots, moles and lesions to be evaluated, some may be new or changing and the patient may have concern these could be cancer.  The following portions of the chart were reviewed this encounter and updated as appropriate: medications, allergies, medical history  Review of Systems:  No other skin or systemic complaints except as noted in HPI or Assessment and Plan.  Objective  Well appearing patient in no apparent distress; mood and affect are within normal limits.  A full examination was performed including scalp, head, eyes, ears, nose, lips, neck, chest, axillae, abdomen, back, buttocks, bilateral upper extremities, bilateral lower extremities, hands, feet, fingers, toes, fingernails, and toenails. All findings within normal limits unless otherwise noted below.   Relevant physical exam findings are noted in the Assessment and Plan.               Assessment & Plan   SKIN CANCER SCREENING PERFORMED TODAY.  ACTINIC DAMAGE - Chronic condition, secondary to cumulative UV/sun exposure - diffuse scaly erythematous macules with underlying dyspigmentation - Recommend daily broad spectrum sunscreen SPF 30+ to sun-exposed areas, reapply every 2 hours as needed.  - Staying in the shade or wearing long sleeves, sun glasses (UVA+UVB protection) and wide brim hats (4-inch brim around the entire circumference of the hat) are also recommended for sun protection.  - Call for new or changing lesions.  LENTIGINES, SEBORRHEIC KERATOSES, HEMANGIOMAS - Benign normal skin lesions - Benign-appearing - Call for any changes  MELANOCYTIC NEVI - Tan-brown and/or pink-flesh-colored  symmetric macules and papules - Benign appearing on exam today - Observation - Call clinic for new or changing moles - Recommend daily use of broad spectrum spf 30+ sunscreen to sun-exposed areas.   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 Legs; breasts Discussed diagnosis; significance of Dx; Sequellae; treatment etc today. Exam: Pink patches at breast, otherwise mostly at legs. No lymphadenopathy at neck, axilla, groin, legs. Photos updated today.  Treatment Plan: Dr. Hester discussed with Dr. Delton at [facility], that previous specimen from 08/25/23 can be used for the T-Cell gene rearrangement study.  Advised pt we will call when we get results and discuss results/treatment.  Discussed referral to Dr. Flynn at [facility] Dr Leora at [facility] for consult.  Patient states that her insurance is dropping from [facility] would prefer for us  to place referral to [facility]. Referral placed to Dr. Alfonse Leora.   Discussed treating entire body with light box therapy at least twice a week with a day in between treatments.   Continue TMC 0.1% cr qd/bid 5d/wk aa rash on legs, avoid f/g/a   Topical steroids (such as triamcinolone , fluocinolone, fluocinonide, mometasone , clobetasol, halobetasol, betamethasone, hydrocortisone) can cause thinning and lightening of the skin if they are used for too long in the same area. Your physician has selected the right strength medicine for your problem and area affected on the body. Please use your medication only as directed by your physician to prevent side effects.     Patient treated in the lightbox/NBUVB for 1 minute and 28 seconds.   ROSACEA Exam Mid face erythema  with telangiectasias +/- scattered inflammatory papules at face Chronic and persistent condition with duration or expected duration over one year. Condition is bothersome/symptomatic for patient. Currently flared. Rosacea is a  chronic progressive skin condition usually affecting the face of adults, causing redness and/or acne bumps. It is treatable but not curable. It sometimes affects the eyes (ocular rosacea) as well. It may respond to topical and/or systemic medication and can flare with stress, sun exposure, alcohol, exercise, topical steroids (including hydrocortisone/cortisone 10) and some foods.  Daily application of broad spectrum spf 30+ sunscreen to face is recommended to reduce flares.  Patient denies grittiness of the eyes  Treatment Plan Counseling for BBL / IPL / Laser and Coordination of Care Discussed the treatment option of Broad Band Light (BBL) /Intense Pulsed Light (IPL)/ Laser for skin discoloration, including brown spots and redness.  Typically we recommend at least 1-3 treatment sessions about 5-8 weeks apart for best results.  Cannot have tanned skin when BBL performed, and regular use of sunscreen/photoprotection is advised after the procedure to help maintain results. The patient's condition may also require maintenance treatments in the future.  The fee for BBL / laser treatments is $350 per treatment session for the whole face.  A fee can be quoted for other parts of the body.  Insurance typically does not pay for BBL/laser treatments and therefore the fee is an out-of-pocket cost. Recommend prophylactic valtrex treatment. Once scheduled for procedure, will send Rx in prior to patient's appointment.   CUTANEOUS T-CELL LYMPHOMA, UNSPECIFIED BODY REGION (HCC)   Related Procedures Phototherapy Treatment Ambulatory referral to Dermatology SKIN CANCER SCREENING   ACTINIC SKIN DAMAGE   LENTIGO   MELANOCYTIC NEVUS, UNSPECIFIED LOCATION   SEBORRHEIC KERATOSIS   ROSACEA   COUNSELING AND COORDINATION OF CARE   MEDICATION MANAGEMENT    Return for lightbox w/ nurse, 3-4 months , w/ Dr. Hester.  I, Jacquelynn V. Wilfred, CMA, am acting as scribe for Alm Hester, MD  .   Documentation: I have reviewed the above documentation for accuracy and completeness, and I agree with the above.  Alm Hester, MD

## 2024-02-09 ENCOUNTER — Encounter: Payer: Self-pay | Admitting: Dermatology

## 2024-02-09 ENCOUNTER — Other Ambulatory Visit: Payer: Self-pay

## 2024-02-09 DIAGNOSIS — C84A0 Cutaneous T-cell lymphoma, unspecified, unspecified site: Secondary | ICD-10-CM

## 2024-02-09 NOTE — Addendum Note (Signed)
 Addended by: HESTER ALM BROCKS on: 02/09/2024 04:10 PM   Modules accepted: Level of Service

## 2024-02-10 ENCOUNTER — Ambulatory Visit

## 2024-02-10 DIAGNOSIS — C84A0 Cutaneous T-cell lymphoma, unspecified, unspecified site: Secondary | ICD-10-CM

## 2024-02-10 NOTE — Progress Notes (Signed)
 Patient here today for lightbox/NBUVB for Cutaneous T-Cell Lymphoma. Patient started treatments at office visit last week.  Patient treated in the lightbox/NBUVB for 1 minutes and 38 seconds on each front and back side.     Alan Pizza, RMA

## 2024-02-16 ENCOUNTER — Telehealth: Payer: Self-pay

## 2024-02-16 ENCOUNTER — Ambulatory Visit

## 2024-02-16 DIAGNOSIS — C84A0 Cutaneous T-cell lymphoma, unspecified, unspecified site: Secondary | ICD-10-CM | POA: Diagnosis not present

## 2024-02-16 NOTE — Telephone Encounter (Signed)
 Patient's [employer] is not in network with [facility]. Patient and spouse are asking if referral can be sent to [facility] or if you know anyone at the [facility] that can treat patient?

## 2024-02-16 NOTE — Progress Notes (Signed)
 Patient here today for lightbox/NBUVB for Cutaneous T-Cell Lymphoma. Patient started treatments at office visit last week.   Patient treated in the lightbox/NBUVB for 1 minutes and 48 seconds on each front and back side.     Alan Pizza, RMA

## 2024-02-18 ENCOUNTER — Ambulatory Visit

## 2024-02-18 DIAGNOSIS — C84A0 Cutaneous T-cell lymphoma, unspecified, unspecified site: Secondary | ICD-10-CM | POA: Diagnosis not present

## 2024-02-18 NOTE — Progress Notes (Signed)
 Patient here today for lightbox/NBUVB for Cutaneous T-Cell Lymphoma. Patient started treatments at office visit last week.   Patient treated in the lightbox/NBUVB for 1 minutes and 58 seconds on each front and back side.     Stefano Saba, RMA

## 2024-02-19 ENCOUNTER — Other Ambulatory Visit: Payer: Self-pay | Admitting: Internal Medicine

## 2024-02-19 DIAGNOSIS — E782 Mixed hyperlipidemia: Secondary | ICD-10-CM

## 2024-02-21 NOTE — Telephone Encounter (Signed)
 Requested Prescriptions  Pending Prescriptions Disp Refills   atorvastatin  (LIPITOR) 40 MG tablet [Pharmacy Med Name: ATORVASTATIN  40 MG TABLET] 90 tablet 0    Sig: TAKE 1 TABLET BY MOUTH EVERY DAY     Cardiovascular:  Antilipid - Statins Failed - 02/21/2024  9:59 AM      Failed - Lipid Panel in normal range within the last 12 months    Cholesterol, Total  Date Value Ref Range Status  11/24/2023 155 100 - 199 mg/dL Final   LDL Chol Calc (NIH)  Date Value Ref Range Status  11/24/2023 67 0 - 99 mg/dL Final   HDL  Date Value Ref Range Status  11/24/2023 68 >39 mg/dL Final   Triglycerides  Date Value Ref Range Status  11/24/2023 110 0 - 149 mg/dL Final         Passed - Patient is not pregnant      Passed - Valid encounter within last 12 months    Recent Outpatient Visits           2 months ago Cellulitis of great toe of right foot   [place] [facility] at [facility], [name], MD   2 months ago Essential hypertension   [place] [facility] at [facility], [name], MD   5 months ago Essential hypertension   [place] [facility] at [facility], [name], MD   7 months ago NSTEMI (non-ST elevated myocardial infarction) [facility])    Primary Care & Sports Medicine at [facility], [name], MD   9 months ago Annual physical exam   [facility] at [facility], [name], MD       Future Appointments             In 2 months Hester Alm BROCKS, MD [facility] [place] [facility]   In 11 months Hester Alm BROCKS, MD [facility] [place] [facility]

## 2024-02-23 ENCOUNTER — Ambulatory Visit

## 2024-02-23 DIAGNOSIS — C84A0 Cutaneous T-cell lymphoma, unspecified, unspecified site: Secondary | ICD-10-CM | POA: Diagnosis not present

## 2024-02-23 NOTE — Progress Notes (Signed)
 Patient here today for lightbox/NBUVB for Cutaneous T-Cell Lymphoma.   Patient treated in the lightbox/NBUVB for 2 minutes and 8 seconds on each front and back side.     Rosina Mayans CMA, AAMA

## 2024-02-25 ENCOUNTER — Telehealth: Payer: Self-pay | Admitting: Internal Medicine

## 2024-02-25 ENCOUNTER — Ambulatory Visit

## 2024-02-25 DIAGNOSIS — C84A0 Cutaneous T-cell lymphoma, unspecified, unspecified site: Secondary | ICD-10-CM

## 2024-02-25 NOTE — Telephone Encounter (Signed)
 Copied from CRM #[id number]. Topic: Medicare AWV >> Feb 25, 2024  2:45 PM Nathanel DEL wrote: Called LVM 02/25/2024 to move AWV appt 04/07/2024 from [place] to [employer] for AWV phone hung up  Nathanel Paschal; Care Guide Ambulatory Clinical Support [place] [facility] Direct Dial: [phone]

## 2024-02-25 NOTE — Progress Notes (Signed)
 Patient here today for lightbox/NBUVB for Cutaneous T-Cell Lymphoma.    Patient treated in the lightbox/NBUVB for 2 minutes and 18 seconds on each front and back side.     Stefano Saba CMA

## 2024-02-25 NOTE — Telephone Encounter (Signed)
 Copied from CRM #[id number]. Topic: Medicare AWV >> Feb 25, 2024  2:45 PM Nathanel DEL wrote: Called LVM 02/25/2024 to move AWV appt 04/07/2024 from [place] to [employer] for AWV phone hung up  Nathanel Paschal; Care Guide Ambulatory Clinical Support [place] [facility] Direct Dial: [phone] >> Feb 25, 2024  2:56 PM Winona SAUNDERS wrote: Pt is not avail for the only appointment on Jan 22nd, please contact the pt with other options that will work for you

## 2024-03-01 ENCOUNTER — Ambulatory Visit

## 2024-03-01 DIAGNOSIS — C84A0 Cutaneous T-cell lymphoma, unspecified, unspecified site: Secondary | ICD-10-CM

## 2024-03-01 NOTE — Progress Notes (Signed)
 Patient here today for lightbox/NBUVB for Cutaneous T-Cell Lymphoma.    Patient treated in the lightbox/NBUVB for 2 minutes and 28 seconds on each front and back side.     Alan Pizza, RMA

## 2024-03-03 ENCOUNTER — Ambulatory Visit

## 2024-03-04 ENCOUNTER — Ambulatory Visit

## 2024-03-04 DIAGNOSIS — C84A0 Cutaneous T-cell lymphoma, unspecified, unspecified site: Secondary | ICD-10-CM

## 2024-03-04 NOTE — Progress Notes (Signed)
 Patient here today for lightbox/NBUVB for Cutaneous T-Cell Lymphoma.    Patient treated in the lightbox/NBUVB for 2 minutes and 38 seconds on each front and back side.     Alan Pizza, RMA

## 2024-03-08 ENCOUNTER — Ambulatory Visit

## 2024-03-08 ENCOUNTER — Telehealth: Payer: Self-pay

## 2024-03-08 DIAGNOSIS — C84A0 Cutaneous T-cell lymphoma, unspecified, unspecified site: Secondary | ICD-10-CM

## 2024-03-08 NOTE — Progress Notes (Signed)
 Patient here today for lightbox/NBUVB for Cutaneous T-Cell Lymphoma.    Patient treated in the lightbox/NBUVB for 2 minutes and 48 seconds on each front and back side.     Alan Pizza, RMA

## 2024-03-08 NOTE — Telephone Encounter (Signed)
 Patient spoke with [facility] regarding referral for Dr. Flynn. They do not have any openings for 2026 at this time and not scheduling for 2027. Patient and spouse are asking if you can refer them to anyone with [facility] in [place] or even [place]. (Her insurance is out of network for [facility]).   Also patient noticed a dark spot resembling a bruise on the lower leg last week. The spot has not improved in 7 days so photo was taken today for your evaluation. It will be in media.

## 2024-03-09 NOTE — Addendum Note (Signed)
 Addended by: TERESA PALMA R on: 03/09/2024 10:08 AM   Modules accepted: Orders

## 2024-03-09 NOTE — Telephone Encounter (Signed)
 Referral to Dr. Ceasar

## 2024-03-16 ENCOUNTER — Ambulatory Visit (INDEPENDENT_AMBULATORY_CARE_PROVIDER_SITE_OTHER)

## 2024-03-16 ENCOUNTER — Telehealth: Payer: Self-pay

## 2024-03-16 DIAGNOSIS — C84A0 Cutaneous T-cell lymphoma, unspecified, unspecified site: Secondary | ICD-10-CM | POA: Diagnosis not present

## 2024-03-16 NOTE — Progress Notes (Signed)
 Patient here today for lightbox/NBUVB for Cutaneous T-Cell Lymphoma.    Patient treated in the lightbox/NBUVB for 2 minutes and 58 seconds on each front and back side.     Alan Pizza, RMA

## 2024-03-16 NOTE — Telephone Encounter (Signed)
 Patient received a call from [facility] yesterday about scheduling with Dr. John C Murray. His about me does say he specializes in phototherapy and treating lymphomas. Patient would really like to schedule with them and get into the [facility] system. Are you okay with me sending referral?

## 2024-03-22 ENCOUNTER — Ambulatory Visit

## 2024-03-22 DIAGNOSIS — C84A0 Cutaneous T-cell lymphoma, unspecified, unspecified site: Secondary | ICD-10-CM

## 2024-03-22 NOTE — Progress Notes (Signed)
 Patient here today for lightbox/NBUVB for Cutaneous T-Cell Lymphoma.    Patient treated in the lightbox/NBUVB for 3 minutes and 8 seconds on each front and back side.     Alan Pizza, RMA

## 2024-03-24 ENCOUNTER — Ambulatory Visit

## 2024-03-24 DIAGNOSIS — C84A0 Cutaneous T-cell lymphoma, unspecified, unspecified site: Secondary | ICD-10-CM

## 2024-03-24 NOTE — Progress Notes (Signed)
 Patient here today for lightbox/NBUVB for Cutaneous T-Cell Lymphoma.   Continued treatment increase of 10 seconds each visit.   Patient treated in the lightbox/NBUVB for 3 minutes and 18 seconds on each front and back side.     Stefano Saba, RMA

## 2024-03-25 ENCOUNTER — Ambulatory Visit: Admitting: Student

## 2024-03-25 ENCOUNTER — Encounter: Payer: Self-pay | Admitting: Student

## 2024-03-25 VITALS — BP 110/72 | HR 59 | Ht 68.0 in | Wt 200.0 lb

## 2024-03-25 DIAGNOSIS — F410 Panic disorder [episodic paroxysmal anxiety] without agoraphobia: Secondary | ICD-10-CM | POA: Diagnosis not present

## 2024-03-25 DIAGNOSIS — I252 Old myocardial infarction: Secondary | ICD-10-CM

## 2024-03-25 DIAGNOSIS — G250 Essential tremor: Secondary | ICD-10-CM | POA: Diagnosis not present

## 2024-03-25 DIAGNOSIS — F324 Major depressive disorder, single episode, in partial remission: Secondary | ICD-10-CM | POA: Diagnosis not present

## 2024-03-25 DIAGNOSIS — I10 Essential (primary) hypertension: Secondary | ICD-10-CM

## 2024-03-25 NOTE — Assessment & Plan Note (Signed)
 Currently on gabapentin  200 mg at bedtime and is helpful for sleep. Follows with Dr. Lane at [facility].

## 2024-03-25 NOTE — Progress Notes (Unsigned)
 "  Established Patient Office Visit  Subjective   Patient ID: Diana Mcdaniel, female    DOB: Dec 06, 1950  Age: 74 y.o. MRN: [id number]  Chief Complaint  Patient presents with   Hypertension    Diana Mcdaniel is a 74 y.o. person with medical hx listed below who presents today for hypertension follow up. Please refer to problem based charting for further details and assessment and plan of current problem and chronic medical conditions.   Patient Active Problem List   Diagnosis Date Noted   History of non-ST elevation myocardial infarction (NSTEMI) 07/03/2023   Primary insomnia 05/03/2019   Family history of colon cancer in father    Polyp of sigmoid colon    Osteopenia determined by x-ray 05/26/2018   Sarcoidosis of lung 04/29/2018   Migraine headache with aura 04/29/2018   Tremor, essential 04/29/2018   Panic disorder 04/29/2018   Essential hypertension 12/01/2017   Mixed hyperlipidemia 12/01/2017   Ganglion cyst of foot 12/01/2017   Major depressive disorder with single episode, in partial remission 12/01/2017   Primary osteoarthritis of left hip 12/01/2017      ROS Refer to HPI    Objective:     Outpatient Encounter Medications as of 03/25/2024  Medication Sig   aspirin EC 81 MG tablet Take 81 mg by mouth daily. Swallow whole.   atorvastatin  (LIPITOR) 40 MG tablet TAKE 1 TABLET BY MOUTH EVERY DAY   B Complex-C (VITAMIN B + C COMPLEX PO)    Calcium -Magnesium-Vitamin D (CALCIUM  1200+D3 PO) Take by mouth daily.   clonazePAM  (KLONOPIN ) 0.5 MG tablet TAKE 1 TABLET BY MOUTH TWICE A DAY AS NEEDED FOR ANXIETY   diltiazem  (CARDIZEM  CD) 120 MG 24 hr capsule Take 1 capsule (120 mg total) by mouth daily.   FLUoxetine  (PROZAC ) 20 MG capsule TAKE 1 CAPSULE BY MOUTH EVERY DAY   gabapentin  (NEURONTIN ) 100 MG capsule Take 1-2 capsules (100-200 mg total) by mouth 2 (two) times daily.   ibuprofen  (ADVIL ) 600 MG tablet Take 1 tablet (600 mg total) by mouth every 6 (six) hours as  needed.   losartan  (COZAAR ) 25 MG tablet Take 1 tablet (25 mg total) by mouth daily.   triamcinolone  cream (KENALOG ) 0.1 % APPLY DAILY TO TWICE DAILY AS DIRECTED TO AFFECTED AREA RASH ON LEGS 5 DAYS A WEEK, AVOID FACE, GROIN, AXILLA   triamcinolone  cream (KENALOG ) 0.1 % Apply 7 gram twice daily to affected areas of skin. Stop once resolved and restart as needed for flares. Avoid use on face, armpits, groin unless otherwise indicated. (Patient not taking: Reported on 03/25/2024)   [DISCONTINUED] Baclofen  5 MG TABS Take 1 tablet (5 mg total) by mouth 3 (three) times daily as needed.   No facility-administered encounter medications on file as of 03/25/2024.    BP 110/72   Pulse (!) 59   Ht 5' 8 (1.727 m)   Wt 200 lb (90.7 kg)   SpO2 97%   BMI 30.41 kg/m  BP Readings from Last 3 Encounters:  03/25/24 110/72  12/19/23 101/77  12/01/23 118/74    Physical Exam Constitutional:      Appearance: Normal appearance.  HENT:     Mouth/Throat:     Mouth: Mucous membranes are moist.     Pharynx: Oropharynx is clear.  Cardiovascular:     Rate and Rhythm: Normal rate and regular rhythm.  Pulmonary:     Effort: Pulmonary effort is normal.     Breath sounds: No rhonchi or rales.  Abdominal:     General: Abdomen is flat. Bowel sounds are normal. There is no distension.     Palpations: Abdomen is soft.     Tenderness: There is no abdominal tenderness.  Musculoskeletal:        General: Normal range of motion.     Right lower leg: No edema.     Left lower leg: No edema.  Skin:    General: Skin is warm and dry.     Capillary Refill: Capillary refill takes less than 2 seconds.  Neurological:     General: No focal deficit present.     Mental Status: She is alert and oriented to person, place, and time. Mental status is at baseline.     Comments: Resting head tremmor  Psychiatric:        Mood and Affect: Mood normal.        Behavior: Behavior normal.        03/25/2024   10:40 AM 12/01/2023     3:20 PM 11/24/2023   10:42 AM  Depression screen PHQ 2/9  Decreased Interest 0 0 0  Down, Depressed, Hopeless 0 0 0  PHQ - 2 Score 0 0 0  Altered sleeping 0 0 0  Tired, decreased energy 1 0 1  Change in appetite 0 0 0  Feeling bad or failure about yourself  0 0 0  Trouble concentrating 0 0 0  Moving slowly or fidgety/restless 0 0 0  Suicidal thoughts 0 0 0  PHQ-9 Score 1 0  1   Difficult doing work/chores Not difficult at all Not difficult at all Not difficult at all     Data saved with a previous flowsheet row definition       03/25/2024   10:40 AM 12/01/2023    3:20 PM 11/24/2023   10:43 AM 09/03/2023   11:00 AM  GAD 7 : Generalized Anxiety Score  Nervous, Anxious, on Edge 0 0 1 1  Control/stop worrying 0 0 1 1  Worry too much - different things 0 0 0 0  Trouble relaxing 0 0 0 0  Restless 0 0 0 0  Easily annoyed or irritable 0 0 0 0  Afraid - awful might happen 0 0 0 1  Total GAD 7 Score 0 0 2 3  Anxiety Difficulty Not difficult at all Not difficult at all Not difficult at all Not difficult at all    No results found for any visits on 03/25/24.  Last CBC Lab Results  Component Value Date   WBC 5.0 05/22/2023   HGB 13.0 05/22/2023   HCT 38.5 05/22/2023   MCV 88 05/22/2023   MCH 29.8 05/22/2023   RDW 12.1 05/22/2023   PLT 309 05/22/2023   Last metabolic panel Lab Results  Component Value Date   GLUCOSE 92 11/24/2023   NA 137 11/24/2023   K 5.0 11/24/2023   CL 99 11/24/2023   CO2 25 11/24/2023   BUN 13 11/24/2023   CREATININE 1.04 (H) 11/24/2023   EGFR 56 (L) 11/24/2023   CALCIUM  10.5 (H) 11/24/2023   PROT 7.3 11/24/2023   ALBUMIN 4.5 11/24/2023   LABGLOB 2.8 11/24/2023   AGRATIO 1.6 05/13/2022   BILITOT 0.5 11/24/2023   ALKPHOS 112 11/24/2023   AST 19 11/24/2023   ALT 18 11/24/2023   Last lipids Lab Results  Component Value Date   CHOL 155 11/24/2023   HDL 68 11/24/2023   LDLCALC 67 11/24/2023   TRIG 110 11/24/2023   CHOLHDL  2.3 11/24/2023   Last  hemoglobin A1c Lab Results  Component Value Date   HGBA1C 5.5 05/04/2020      The ASCVD Risk score (Arnett DK, et al., 2019) failed to calculate for the following reasons:   Risk score cannot be calculated because patient has a medical history suggesting prior/existing ASCVD   * - Cholesterol units were assumed    Assessment & Plan:  Essential hypertension Assessment & Plan: Current medications are losartan  25 mg daily and diltiazem  120 mg daily. Well controlled.  Labs stable on 9/8. No longer having lightheadedness. Continue current medications.    Major depressive disorder with single episode, in partial remission Assessment & Plan: Mood is stable on fluoxetine , PHQ9 is 1 today. Continue fluoxetine  20 mg daily.    Panic disorder Assessment & Plan: Is taking clonazepam  0.25 mg every day for panic attacks. Reports when she tried coming off clonazepam  she had worsening panic attacks. Not having significant anxiety today. Will continue to revisit this with her.    Tremor, essential Assessment & Plan: Currently on gabapentin  200 mg at bedtime and is helpful for sleep. Follows with [facility]. Lane at [facility].    History of non-ST elevation myocardial infarction (NSTEMI) Assessment & Plan: Choelsterol well controlled no cardiac symnptoms.       Return in about 3 months (around 06/23/2024) for physical.    Harlene Saddler, MD "

## 2024-03-25 NOTE — Assessment & Plan Note (Signed)
 Mood is stable on fluoxetine , PHQ9 is 1 today. Well controlled continue on current medication.

## 2024-03-25 NOTE — Assessment & Plan Note (Signed)
 Choelsterol well controlled no cardiac symnptoms.

## 2024-03-25 NOTE — Assessment & Plan Note (Signed)
 Current medications are losartan  25 mg daily and diltiazem  120 mg daily. Well controlled.  Labs stable on 9/8. No longer having lightheadedness. Continue current medications.

## 2024-03-25 NOTE — Assessment & Plan Note (Signed)
 Is taking clonazepam  0.25 mg every day for panic attacks. Reports when she tried coming off clonazepam  she had worsening panic attacks. Not having significant anxiety today. Will continue to revisit this with her.

## 2024-03-29 ENCOUNTER — Ambulatory Visit

## 2024-03-29 DIAGNOSIS — C84A0 Cutaneous T-cell lymphoma, unspecified, unspecified site: Secondary | ICD-10-CM

## 2024-03-29 NOTE — Progress Notes (Signed)
 Patient here today for lightbox/NBUVB for Cutaneous T-Cell Lymphoma.    Continued treatment increase of 10 seconds each visit.   Patient treated in the lightbox/NBUVB for 3 minutes and 28 seconds on each front and back side.     Stefano Saba, RMA

## 2024-03-31 ENCOUNTER — Ambulatory Visit (INDEPENDENT_AMBULATORY_CARE_PROVIDER_SITE_OTHER)

## 2024-03-31 DIAGNOSIS — C84A0 Cutaneous T-cell lymphoma, unspecified, unspecified site: Secondary | ICD-10-CM

## 2024-03-31 NOTE — Progress Notes (Signed)
 Patient here today for lightbox/NBUVB for Cutaneous T-Cell Lymphoma.    Continued treatment increase of 10 seconds each visit.   Patient treated in the lightbox/NBUVB for 3 minutes and 38 seconds on each front and back side.     Stefano Saba, RMA

## 2024-04-06 ENCOUNTER — Ambulatory Visit

## 2024-04-06 DIAGNOSIS — C84A0 Cutaneous T-cell lymphoma, unspecified, unspecified site: Secondary | ICD-10-CM | POA: Diagnosis not present

## 2024-04-06 NOTE — Progress Notes (Signed)
 Patient here today for lightbox/NBUVB for Cutaneous T-Cell Lymphoma.    Continued treatment increase of 10 seconds each visit.   Patient treated in the lightbox/NBUVB for 3 minutes and 48 seconds on each front and back side.     Stefano Saba, RMA

## 2024-04-08 ENCOUNTER — Ambulatory Visit

## 2024-04-08 ENCOUNTER — Other Ambulatory Visit: Payer: Self-pay | Admitting: Student

## 2024-04-08 ENCOUNTER — Ambulatory Visit (INDEPENDENT_AMBULATORY_CARE_PROVIDER_SITE_OTHER)

## 2024-04-08 DIAGNOSIS — C84A0 Cutaneous T-cell lymphoma, unspecified, unspecified site: Secondary | ICD-10-CM

## 2024-04-08 DIAGNOSIS — F410 Panic disorder [episodic paroxysmal anxiety] without agoraphobia: Secondary | ICD-10-CM

## 2024-04-08 NOTE — Progress Notes (Signed)
 Patient here today for lightbox/NBUVB for Cutaneous T-Cell Lymphoma.    Continued treatment increase of 10 seconds each visit.   Patient treated in the lightbox/NBUVB for 3 minutes and 58 seconds on each front and back side.     Alan Pizza, RMA

## 2024-04-08 NOTE — Telephone Encounter (Signed)
 Please review medication refill request

## 2024-04-08 NOTE — Telephone Encounter (Signed)
 Requested medication (s) are due for refill today: Yes  Requested medication (s) are on the active medication list: Yes  Last refill:  12/02/23  Future visit scheduled:   Notes to clinic:  Not delegated.    Requested Prescriptions  Pending Prescriptions Disp Refills   clonazePAM  (KLONOPIN ) 0.5 MG tablet 20 tablet 2    Sig: TAKE 1 TABLET BY MOUTH TWICE A DAY AS NEEDED FOR ANXIETY     Not Delegated - Psychiatry: Anxiolytics/Hypnotics 2 Failed - 04/08/2024  1:25 PM      Failed - This refill cannot be delegated      Failed - Urine Drug Screen completed in last 360 days      Passed - Patient is not pregnant      Passed - Valid encounter within last 6 months    Recent Outpatient Visits           2 weeks ago Essential hypertension   [place] [facility] at [facility], Harlene, MD   4 months ago Cellulitis of great toe of right foot   [place] [facility] at [facility], [name], MD   4 months ago Essential hypertension   [place] [facility] at [facility], [name], MD   7 months ago Essential hypertension   [place] [facility] at [facility], [name], MD   9 months ago NSTEMI (non-ST elevated myocardial infarction) [facility])   [place] [facility] at [facility], [name], MD       Future Appointments             In 4 weeks Hester Alm BROCKS, MD [facility] [place] [facility]   In 9 months Hester Alm BROCKS, MD [facility]

## 2024-04-08 NOTE — Telephone Encounter (Signed)
 Copied from CRM [id number]. Topic: Clinical - Medication Refill >> Apr 08, 2024  8:54 AM Deaijah H wrote: Medication: clonazePAM  (KLONOPIN ) 0.5 MG tablet  Has the patient contacted their pharmacy? Yes (Agent: If no, request that the patient contact the pharmacy for the refill. If patient does not wish to contact the pharmacy document the reason why and proceed with request.) haven't received anything  (Agent: If yes, when and what did the pharmacy advise?)  This is the patient's preferred pharmacy:  [facility] [phone] [address], [place] - [address] [place] [address] Phone: [phone] Fax: [phone]  Is this the correct pharmacy for this prescription? Yes If no, delete pharmacy and type the correct one.   Has the prescription been filled recently? Yes 12/16  Is the patient out of the medication? Yes  Has the patient been seen for an appointment in the last year OR does the patient have an upcoming appointment? Yes  Can we respond through MyChart? Yes  Agent: Please be advised that Rx refills may take up to 3 business days. We ask that you follow-up with your pharmacy.

## 2024-04-09 MED ORDER — CLONAZEPAM 0.5 MG PO TABS: ORAL_TABLET | ORAL | 2 refills | Status: AC

## 2024-04-13 ENCOUNTER — Ambulatory Visit

## 2024-04-13 DIAGNOSIS — C84A0 Cutaneous T-cell lymphoma, unspecified, unspecified site: Secondary | ICD-10-CM | POA: Diagnosis not present

## 2024-04-13 NOTE — Progress Notes (Signed)
 Patient here today for lightbox/NBUVB for Cutaneous T-Cell Lymphoma.    Continued treatment increase of 10 seconds each visit.   Patient treated in the lightbox/NBUVB for 4 minutes and 8 seconds on each front and back side.     Alan Pizza, RMA

## 2024-04-15 ENCOUNTER — Ambulatory Visit

## 2024-04-15 VITALS — BP 112/69 | Ht 68.0 in | Wt 202.6 lb

## 2024-04-15 DIAGNOSIS — C84A0 Cutaneous T-cell lymphoma, unspecified, unspecified site: Secondary | ICD-10-CM | POA: Diagnosis not present

## 2024-04-15 DIAGNOSIS — Z0000 Encounter for general adult medical examination without abnormal findings: Secondary | ICD-10-CM

## 2024-04-15 NOTE — Progress Notes (Signed)
 "  Please attest and cosign this visit due to patients primary care provider not being in the office at the time the visit was completed.    Chief Complaint  Patient presents with   Medicare Wellness     Subjective:   Diana Mcdaniel is a 74 y.o. female who presents for a Medicare Annual Wellness Visit.  Visit info / Clinical Intake: Medicare Wellness Visit Type:: Subsequent Annual Wellness Visit Persons participating in visit and providing information:: patient Medicare Wellness Visit Mode:: In-person (required for WTM) Interpreter Needed?: No Pre-visit prep was completed: yes AWV questionnaire completed by patient prior to visit?: yes Date:: 04/13/24 Living arrangements:: (Patient-Rptd) lives with spouse/significant other Patient's Overall Health Status Rating: (Patient-Rptd) good Typical amount of pain: (Patient-Rptd) some Does pain affect daily life?: (Patient-Rptd) no Are you currently prescribed opioids?: no  Dietary Habits and Nutritional Risks How many meals a day?: (Patient-Rptd) 2 Eats fruit and vegetables daily?: (Patient-Rptd) yes Most meals are obtained by: (Patient-Rptd) preparing own meals In the last 2 weeks, have you had any of the following?: none Diabetic:: no  Functional Status Activities of Daily Living (to include ambulation/medication): (Patient-Rptd) Independent Ambulation: (Patient-Rptd) Independent Medication Administration: (Patient-Rptd) Independent Home Management (perform basic housework or laundry): (Patient-Rptd) Independent Manage your own finances?: (Patient-Rptd) yes Primary transportation is: (Patient-Rptd) driving Concerns about vision?: no *vision screening is required for WTM* Concerns about hearing?: no  Fall Screening Falls in the past year?: (Patient-Rptd) 0 Number of falls in past year: 0 Was there an injury with Fall?: 0 Fall Risk Category Calculator: 0 Patient Fall Risk Level: Low Fall Risk  Fall Risk Patient at Risk for  Falls Due to: No Fall Risks Fall risk Follow up: Falls evaluation completed; Education provided; Falls prevention discussed  Home and Transportation Safety: All rugs have non-skid backing?: (Patient-Rptd) yes All stairs or steps have railings?: (Patient-Rptd) yes Grab bars in the bathtub or shower?: (!) (Patient-Rptd) no Have non-skid surface in bathtub or shower?: (Patient-Rptd) yes Good home lighting?: (Patient-Rptd) yes Regular seat belt use?: (Patient-Rptd) yes Hospital stays in the last year:: (!) (Patient-Rptd) yes How many hospital stays:: (Patient-Rptd) 1  Cognitive Assessment Difficulty concentrating, remembering, or making decisions? : (Patient-Rptd) no Will 6CIT or Mini Cog be Completed: yes What year is it?: 0 points What month is it?: 0 points Give patient an address phrase to remember (5 components): [address] [place]  About what time is it?: 0 points Count backwards from 20 to 1: 0 points Say the months of the year in reverse: 0 points Repeat the address phrase from earlier: 0 points 6 CIT Score: 0 points  Advance Directives (For Healthcare) Does Patient Have a Medical Advance Directive?: Yes Does patient want to make changes to medical advance directive?: No - Patient declined Type of Advance Directive: Healthcare Power of [place]; Living will Copy of Healthcare Power of Attorney in Chart?: Yes - validated most recent copy scanned in chart (See row information) Copy of Living Will in Chart?: Yes - validated most recent copy scanned in chart (See row information)  Reviewed/Updated  Reviewed/Updated: Reviewed All (Medical, Surgical, Family, Medications, Allergies, Care Teams, Patient Goals)    Allergies (verified) Patient has no known allergies.   Current Medications (verified) Outpatient Encounter Medications as of 04/15/2024  Medication Sig   aspirin EC 81 MG tablet Take 81 mg by mouth daily. Swallow whole.   atorvastatin  (LIPITOR) 40 MG tablet  TAKE 1 TABLET BY MOUTH EVERY DAY   B Complex-C (  VITAMIN B + C COMPLEX PO)    Calcium -Magnesium-Vitamin D (CALCIUM  1200+D3 PO) Take by mouth daily.   clonazePAM  (KLONOPIN ) 0.5 MG tablet TAKE 1 TABLET BY MOUTH TWICE A DAY AS NEEDED FOR ANXIETY   diltiazem  (CARDIZEM  CD) 120 MG 24 hr capsule Take 1 capsule (120 mg total) by mouth daily.   FLUoxetine  (PROZAC ) 20 MG capsule TAKE 1 CAPSULE BY MOUTH EVERY DAY   gabapentin  (NEURONTIN ) 100 MG capsule Take 1-2 capsules (100-200 mg total) by mouth 2 (two) times daily.   ibuprofen  (ADVIL ) 600 MG tablet Take 1 tablet (600 mg total) by mouth every 6 (six) hours as needed.   losartan  (COZAAR ) 25 MG tablet Take 1 tablet (25 mg total) by mouth daily.   triamcinolone  cream (KENALOG ) 0.1 % APPLY DAILY TO TWICE DAILY AS DIRECTED TO AFFECTED AREA RASH ON LEGS 5 DAYS A WEEK, AVOID FACE, GROIN, AXILLA   triamcinolone  cream (KENALOG ) 0.1 % Apply 7 gram twice daily to affected areas of skin. Stop once resolved and restart as needed for flares. Avoid use on face, armpits, groin unless otherwise indicated.   No facility-administered encounter medications on file as of 04/15/2024.    History: Past Medical History:  Diagnosis Date   Anxiety    Arthritis 2005   Cancer (HCC)    Elevated TSH 11/02/2019   Normal T4 04/2019   Hyperlipidemia    Hypertension    Major depressive disorder with single episode, in partial remission    Migraine headache    1-2x/year   NSTEMI (non-ST elevated myocardial infarction) (HCC) 07/03/2023   Sarcoidosis    Past Surgical History:  Procedure Laterality Date   CARDIAC CATHETERIZATION  2010   Normal   COLONOSCOPY N/A 10/02/2023   Procedure: COLONOSCOPY;  Surgeon: Jinny Carmine, MD;  Location: [facility] END[facility]COPY;  Service: Endoscopy;  Laterality: N/A;   COLONOSCOPY WITH PROPOFOL  N/A 09/28/2018   Procedure: COLONOSCOPY WITH  biopsy;  Surgeon: Jinny Carmine, MD;  Location: [facility];  Service: Endoscopy;  Laterality: N/A;    POLYPECTOMY  09/28/2018   Procedure: POLYPECTOMY;  Surgeon: Jinny Carmine, MD;  Location[facility];  Service: Endoscopy;;   POLYPECTOMY  10/02/2023   Procedure: POLYPECTOMY, INTESTINE;  Surgeon: Jinny Carmine, MD;  Location: [facility] ENDOSCOPY;  Service: Endoscopy;;   Family History  Problem Relation Age of Onset   Anxiety disorder Mother    Colon cancer Father    Alcohol abuse Father    Cancer Father    Skin cancer Sister    Breast cancer Sister 72   Cancer Sister    Skin cancer Brother    Breast cancer Maternal Grandmother 57   Diabetes Neg Hx    Heart disease Neg Hx    Hypertension Neg Hx    Social History   Occupational History   Not on file  Tobacco Use   Smoking status: Former    Current packs/day: 0.00    Average packs/day: 1 pack/day for 15.0 years (15.0 ttl pk-yrs)    Types: Cigarettes    Start date: 03/18/1978    Quit date: 03/18/1993    Years since quitting: 31.0   Smokeless tobacco: Never  Vaping Use   Vaping status: Never Used  Substance and Sexual Activity   Alcohol use: Not Currently    Alcohol/week: 24.0 standard drinks of alcohol    Comment: Stopped drinking wine in April 2025   Drug use: Never   Sexual activity: Not Currently    Birth control/protection: Post-menopausal  Tobacco Counseling Counseling given: Not Answered  SDOH Screenings   Food Insecurity: No Food Insecurity (04/15/2024)  Housing: Low Risk (04/15/2024)  Transportation Needs: No Transportation Needs (04/15/2024)  Utilities: Not At Risk (04/15/2024)  Alcohol Screen: Low Risk (04/15/2024)  Depression (PHQ2-9): Low Risk (04/15/2024)  Financial Resource Strain: Low Risk (04/15/2024)  Physical Activity: Inactive (04/15/2024)  Social Connections: Moderately Isolated (04/15/2024)  Stress: No Stress Concern Present (04/15/2024)  Tobacco Use: Medium Risk (04/15/2024)  Health Literacy: Adequate Health Literacy (04/15/2024)   See flowsheets for full screening details  Depression Screen PHQ 2 &  9 Depression Scale- Over the past 2 weeks, how often have you been bothered by any of the following problems? Little interest or pleasure in doing things: 0 Feeling down, depressed, or hopeless (PHQ Adolescent also includes...irritable): 0 PHQ-2 Total Score: 0 Trouble falling or staying asleep, or sleeping too much: 0 Feeling tired or having little energy: 1 Poor appetite or overeating (PHQ Adolescent also includes...weight loss): 0 Feeling bad about yourself - or that you are a failure or have let yourself or your family down: 0 Trouble concentrating on things, such as reading the newspaper or watching television (PHQ Adolescent also includes...like school work): 0 Moving or speaking so slowly that other people could have noticed. Or the opposite - being so fidgety or restless that you have been moving around a lot more than usual: 0 Thoughts that you would be better off dead, or of hurting yourself in some way: 0 PHQ-9 Total Score: 1 If you checked off any problems, how difficult have these problems made it for you to do your work, take care of things at home, or get along with other people?: Not difficult at all  Depression Treatment Depression Interventions/Treatment : Counseling     Goals Addressed             This Visit's Progress    DIET - EAT MORE FRUITS AND VEGETABLES   On track    Increase physical activity   Not on track    Recommend physical activity for at least 30 minutes 3 times per day      Patient Stated   On track    Patient states she would like to maintain health and activity level             Objective:    Today's Vitals   04/15/24 1352  BP: 112/69  Weight: 202 lb 9.6 oz (91.9 kg)  Height: 5' 8 (1.727 m)   Body mass index is 30.81 kg/m.  Hearing/Vision screen No results found. Immunizations and Health Maintenance Health Maintenance  Topic Date Due   Zoster Vaccines- Shingrix (1 of 2) Never done   Medicare Annual Wellness (AWV)  04/01/2024    COVID-19 Vaccine (8 - Moderna risk 2025-26 season) 06/01/2024   Mammogram  07/20/2024   Colonoscopy  10/01/2028   DTaP/Tdap/Td (2 - Td or Tdap) 11/29/2031   Pneumococcal Vaccine: 50+ Years  Completed   Influenza Vaccine  Completed   Bone Density Scan  Completed   Hepatitis C Screening  Completed   Meningococcal B Vaccine  Aged Out        Assessment/Plan:  This is a routine wellness examination for Diana Mcdaniel.  Patient Care Team: Lemon Raisin, MD as PCP - General (Internal Medicine) Hester Alm BROCKS, MD (Dermatology) Ashley Soulier, DPM as Referring Physician (Podiatry) [place] [facility])  I have personally reviewed and noted the following in the patients chart:   Medical and social history  Use of alcohol, tobacco or illicit drugs  Current medications and supplements including opioid prescriptions. Functional ability and status Nutritional status Physical activity Advanced directives List of other physicians Hospitalizations, surgeries, and ER visits in previous 12 months Vitals Screenings to include cognitive, depression, and falls Referrals and appointments  No orders of the defined types were placed in this encounter.  In addition, I have reviewed and discussed with patient certain preventive protocols, quality metrics, and best practice recommendations. A written personalized care plan for preventive services as well as general preventive health recommendations were provided to patient.   Erminio LITTIE Saris, LPN   [id number]/[id number]   After Visit Summary: (In Person-Declined) Patient declined AVS at this time.Pt will view in MyChart  Nurse Notes: No voiced or noted concerns at this time Patient advised to keep follow-up appointment with PCP (May 2026) Vaccines not given: Shingrix  declined today  "

## 2024-04-15 NOTE — Patient Instructions (Signed)
 Diana Mcdaniel,  Thank you for taking the time for your Medicare Wellness Visit. I appreciate your continued commitment to your health goals. Please review the care plan we discussed, and feel free to reach out if I can assist you further.  Please note that Annual Wellness Visits do not include a physical exam. Some assessments may be limited, especially if the visit was conducted virtually. If needed, we may recommend an in-person follow-up with your provider.  Ongoing Care Seeing your primary care provider every 3 to 6 months helps us  monitor your health and provide consistent, personalized care.   Referrals If a referral was made during today's visit and you haven't received any updates within two weeks, please contact the referred provider directly to check on the status.  Recommended Screenings:  Health Maintenance  Topic Date Due   Zoster (Shingles) Vaccine (1 of 2) Never done   Medicare Annual Wellness Visit  04/01/2024   COVID-19 Vaccine (8 - Moderna risk 2025-26 season) 06/01/2024   Breast Cancer Screening  07/20/2024   Colon Cancer Screening  10/01/2028   DTaP/Tdap/Td vaccine (2 - Td or Tdap) 11/29/2031   Pneumococcal Vaccine for age over 85  Completed   Flu Shot  Completed   Osteoporosis screening with Bone Density Scan  Completed   Hepatitis C Screening  Completed   Meningitis B Vaccine  Aged Out       04/13/2024   11:55 AM  Advanced Directives  Does Patient Have a Medical Advance Directive? Yes  Type of Estate Agent of [place];Living will  Does patient want to make changes to medical advance directive? No - Patient declined  Copy of Healthcare Power of Attorney in Chart? Yes - validated most recent copy scanned in chart (See row information)    Vision: Annual vision screenings are recommended for early detection of glaucoma, cataracts, and diabetic retinopathy. These exams can also reveal signs of chronic conditions such as diabetes and high blood  pressure.  Dental: Annual dental screenings help detect early signs of oral cancer, gum disease, and other conditions linked to overall health, including heart disease and diabetes.  Please see the attached documents for additional preventive care recommendations.

## 2024-04-15 NOTE — Progress Notes (Signed)
 Patient here today for lightbox/NBUVB for Cutaneous T-Cell Lymphoma.    Continued treatment increase of 10 seconds each visit.   Patient treated in the lightbox/NBUVB for 4 minutes and 18 seconds on each front and back side.     Alan Pizza, RMA

## 2024-04-20 ENCOUNTER — Ambulatory Visit

## 2024-04-20 DIAGNOSIS — C84A0 Cutaneous T-cell lymphoma, unspecified, unspecified site: Secondary | ICD-10-CM | POA: Diagnosis not present

## 2024-04-20 NOTE — Progress Notes (Signed)
 Patient here today for lightbox/NBUVB for Cutaneous T-Cell Lymphoma.    Continued treatment increase of 10 seconds each visit.   Patient treated in the lightbox/NBUVB for 4 minutes and 28 seconds on each front and back side.     Alan Pizza, RMA

## 2024-04-22 ENCOUNTER — Ambulatory Visit

## 2024-04-22 DIAGNOSIS — C84A0 Cutaneous T-cell lymphoma, unspecified, unspecified site: Secondary | ICD-10-CM | POA: Diagnosis not present

## 2024-04-22 NOTE — Progress Notes (Signed)
 Patient here today for lightbox/NBUVB for Cutaneous T-Cell Lymphoma.    Continued treatment increase of 10 seconds each visit.   Patient treated in the lightbox/NBUVB for 4 minutes and 38 seconds on each front and back side.     Alan Pizza, RMA

## 2024-04-26 ENCOUNTER — Ambulatory Visit

## 2024-04-28 ENCOUNTER — Ambulatory Visit

## 2024-05-06 ENCOUNTER — Ambulatory Visit: Admitting: Dermatology

## 2024-07-29 ENCOUNTER — Encounter: Admitting: Student

## 2025-02-01 ENCOUNTER — Ambulatory Visit: Admitting: Dermatology
